# Patient Record
Sex: Female | Born: 1970 | Race: White | Hispanic: No | Marital: Married | Smoking: Former smoker
Health system: Southern US, Community
[De-identification: ages and names within clinical notes are randomized; demographics above are authoritative.]

## PROBLEM LIST (undated history)

## (undated) DIAGNOSIS — J189 Pneumonia, unspecified organism: Secondary | ICD-10-CM

## (undated) DIAGNOSIS — D649 Anemia, unspecified: Secondary | ICD-10-CM

## (undated) DIAGNOSIS — K224 Dyskinesia of esophagus: Secondary | ICD-10-CM

## (undated) DIAGNOSIS — M459 Ankylosing spondylitis of unspecified sites in spine: Secondary | ICD-10-CM

## (undated) DIAGNOSIS — N200 Calculus of kidney: Secondary | ICD-10-CM

## (undated) DIAGNOSIS — E785 Hyperlipidemia, unspecified: Secondary | ICD-10-CM

## (undated) DIAGNOSIS — R350 Frequency of micturition: Secondary | ICD-10-CM

## (undated) DIAGNOSIS — Z8719 Personal history of other diseases of the digestive system: Secondary | ICD-10-CM

## (undated) DIAGNOSIS — M5137 Other intervertebral disc degeneration, lumbosacral region: Secondary | ICD-10-CM

## (undated) DIAGNOSIS — R079 Chest pain, unspecified: Secondary | ICD-10-CM

## (undated) DIAGNOSIS — E7800 Pure hypercholesterolemia, unspecified: Secondary | ICD-10-CM

## (undated) DIAGNOSIS — M1990 Unspecified osteoarthritis, unspecified site: Secondary | ICD-10-CM

## (undated) DIAGNOSIS — G43909 Migraine, unspecified, not intractable, without status migrainosus: Secondary | ICD-10-CM

## (undated) DIAGNOSIS — H269 Unspecified cataract: Secondary | ICD-10-CM

## (undated) DIAGNOSIS — G5603 Carpal tunnel syndrome, bilateral upper limbs: Secondary | ICD-10-CM

## (undated) DIAGNOSIS — G540 Brachial plexus disorders: Secondary | ICD-10-CM

## (undated) DIAGNOSIS — Z87440 Personal history of urinary (tract) infections: Secondary | ICD-10-CM

## (undated) DIAGNOSIS — M51379 Other intervertebral disc degeneration, lumbosacral region without mention of lumbar back pain or lower extremity pain: Secondary | ICD-10-CM

## (undated) DIAGNOSIS — I10 Essential (primary) hypertension: Secondary | ICD-10-CM

## (undated) DIAGNOSIS — K219 Gastro-esophageal reflux disease without esophagitis: Secondary | ICD-10-CM

## (undated) DIAGNOSIS — I201 Angina pectoris with documented spasm: Secondary | ICD-10-CM

## (undated) DIAGNOSIS — Z973 Presence of spectacles and contact lenses: Secondary | ICD-10-CM

## (undated) DIAGNOSIS — I2510 Atherosclerotic heart disease of native coronary artery without angina pectoris: Secondary | ICD-10-CM

## (undated) DIAGNOSIS — F419 Anxiety disorder, unspecified: Secondary | ICD-10-CM

## (undated) DIAGNOSIS — Z87442 Personal history of urinary calculi: Secondary | ICD-10-CM

## (undated) DIAGNOSIS — N289 Disorder of kidney and ureter, unspecified: Secondary | ICD-10-CM

## (undated) DIAGNOSIS — Z87898 Personal history of other specified conditions: Secondary | ICD-10-CM

## (undated) HISTORY — DX: Atherosclerotic heart disease of native coronary artery without angina pectoris: I25.10

## (undated) HISTORY — PX: ANTERIOR CRUCIATE LIGAMENT (ACL) REVISION: SHX6707

## (undated) HISTORY — DX: Pure hypercholesterolemia, unspecified: E78.00

## (undated) HISTORY — DX: Essential (primary) hypertension: I10

## (undated) HISTORY — DX: Angina pectoris with documented spasm: I20.1

## (undated) HISTORY — PX: LUMBAR SPINE SURGERY: SHX701

## (undated) HISTORY — PX: ABDOMINOPLASTY: SUR9

## (undated) HISTORY — PX: KNEE ARTHROSCOPY W/ SYNOVECTOMY: SHX1887

## (undated) HISTORY — DX: Unspecified cataract: H26.9

## (undated) HISTORY — PX: POSTERIOR LUMBAR FUSION: SHX6036

## (undated) HISTORY — PX: HERNIA REPAIR: SHX51

## (undated) HISTORY — DX: Chest pain, unspecified: R07.9

## (undated) HISTORY — PX: KNEE SURGERY: SHX244

## (undated) HISTORY — PX: BACK SURGERY: SHX140

## (undated) HISTORY — DX: Anxiety disorder, unspecified: F41.9

---
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
## 2000-08-11 HISTORY — PX: KNEE ARTHROSCOPY W/ ACL RECONSTRUCTION: SHX1858

## 2002-08-11 HISTORY — PX: KNEE SURGERY: SHX244

## 2003-08-12 HISTORY — PX: OTHER SURGICAL HISTORY: SHX169

## 2004-01-09 ENCOUNTER — Ambulatory Visit (HOSPITAL_BASED_OUTPATIENT_CLINIC_OR_DEPARTMENT_OTHER): Admission: RE | Admit: 2004-01-09 | Discharge: 2004-01-09 | Payer: Self-pay | Admitting: Orthopedic Surgery

## 2007-08-12 HISTORY — PX: CHOLECYSTECTOMY OPEN: SUR202

## 2007-08-12 HISTORY — PX: HERNIA REPAIR: SHX51

## 2007-08-12 HISTORY — PX: CHOLECYSTECTOMY: SHX55

## 2010-08-11 HISTORY — PX: CYSTOSCOPY/RETROGRADE/URETEROSCOPY/STONE EXTRACTION WITH BASKET: SHX5317

## 2012-08-11 HISTORY — PX: OTHER SURGICAL HISTORY: SHX169

## 2014-08-11 DIAGNOSIS — Z8711 Personal history of peptic ulcer disease: Secondary | ICD-10-CM

## 2014-08-11 HISTORY — DX: Personal history of peptic ulcer disease: Z87.11

## 2015-08-12 HISTORY — PX: TOTAL ABDOMINAL HYSTERECTOMY: SHX209

## 2015-08-12 HISTORY — PX: KNEE ARTHROSCOPY W/ ACL RECONSTRUCTION: SHX1858

## 2016-02-26 HISTORY — PX: TOTAL LAPAROSCOPIC HYSTERECTOMY WITH BILATERAL SALPINGO OOPHORECTOMY: SHX6845

## 2016-06-01 ENCOUNTER — Emergency Department (HOSPITAL_COMMUNITY)
Admission: EM | Admit: 2016-06-01 | Discharge: 2016-06-01 | Disposition: A | Discharge status: Home / Self Care | Payer: Managed Care, Other (non HMO) | Attending: Emergency Medicine | Admitting: Emergency Medicine

## 2016-06-01 ENCOUNTER — Encounter: Payer: Self-pay | Admitting: Emergency Medicine

## 2016-06-01 ENCOUNTER — Emergency Department (HOSPITAL_COMMUNITY): Payer: Managed Care, Other (non HMO)

## 2016-06-01 DIAGNOSIS — J069 Acute upper respiratory infection, unspecified: Secondary | ICD-10-CM | POA: Diagnosis not present

## 2016-06-01 DIAGNOSIS — M5412 Radiculopathy, cervical region: Secondary | ICD-10-CM

## 2016-06-01 DIAGNOSIS — R509 Fever, unspecified: Secondary | ICD-10-CM | POA: Diagnosis present

## 2016-06-01 DIAGNOSIS — Z79899 Other long term (current) drug therapy: Secondary | ICD-10-CM | POA: Insufficient documentation

## 2016-06-01 MED ORDER — METAXALONE 400 MG PO TABS: 400.0000 mg | ORAL_TABLET | Freq: Three times a day (TID) | ORAL | 0 refills | Status: DC | PRN

## 2016-06-01 NOTE — ED Triage Notes (Signed)
Pt reports left shoulder pain that radiates down her left arm, fever, and wet non-productive cough. Pt states she was seen and d/c'd by [facility] w/ dx of a pinched nerve in her neck. Pt states pain meds have provided no relief and she was unable to secure an appt w/ her PCP for further evaluation.

## 2016-06-01 NOTE — ED Provider Notes (Signed)
[facility] DEPT Provider Note   CSN: [id number] Arrival date & time: 06/01/16  1346     History   Chief Complaint Chief Complaint  Patient presents with  . Arm Pain  . Fever  . Cough    HPI Tanice Petre is a 45 y.o. female.  HPI Patient presents with neck pain going down to her left arm. She's had it for the last 4 days. She was seen by [facility]. Reportedly had x-rays and was told to watch out for fevers. She was given oxycodone tablets and states she was told to take 20 mg if she needed it. Also given Valium. She told she was also given Narcan to take in case she stopped breathing. States she was told to return if she developed fevers. States she's now developed low-grade fevers of 99. States the pain started after coughing. States on Wednesday she had a cough. States she was seen by primary care doctor and given a shot of "kenalog" she states that it helped the cough did not change her otherwise. The next day she developed the neck pain. She states that her daughter is in nursing school and said that neck pain could be a heart attack so she needed to go to the ER. She states the pain goes down the arm. States she was told was likely a pinched nerve. States it feels like when you put your tongue on a battery. No weakness. States she initially had mildly productive cough and now is wet but states she's not been able to bring anything up with it. No dysuria. Some abdominal pain after recent hysterectomy.   No past medical history on file.  There are no active problems to display for this patient.   No past surgical history on file.  OB History    No data available       Home Medications    Prior to Admission medications   Medication Sig Start Date End Date Taking? Authorizing Provider  acetaminophen (TYLENOL) 500 MG tablet Take 500 mg by mouth every 6 (six) hours as needed for moderate pain.   Yes Historical Provider, MD  amLODipine-valsartan (EXFORGE) 5-320 MG  tablet Take 1 tablet by mouth every evening.   Yes Historical Provider, MD  estradiol (ESTRACE) 1 MG tablet Take 1 mg by mouth every evening.   Yes Historical Provider, MD  metoprolol tartrate (LOPRESSOR) 25 MG tablet Take 25 mg by mouth 2 (two) times daily.   Yes Historical Provider, MD  nitroGLYCERIN (NITROSTAT) 0.4 MG SL tablet Place 0.4 mg under the tongue every 5 (five) minutes as needed for chest pain.   Yes Historical Provider, MD  Oxycodone HCl 10 MG TABS Take 10-20 mg by mouth every 6 (six) hours as needed (pain).   Yes Historical Provider, MD  pantoprazole (PROTONIX) 40 MG tablet Take 40 mg by mouth 2 (two) times daily.   Yes Historical Provider, MD  PROAIR RESPICLICK [phone] Base) MCG/ACT AEPB Take 2 puffs by mouth every 6 (six) hours as needed (sob and wheezing).  05/29/16  Yes Historical Provider, MD  promethazine (PHENERGAN) 25 MG tablet Take 25 mg by mouth every 6 (six) hours as needed for nausea or vomiting.   Yes Historical Provider, MD  senna-docusate (SENOKOT-S) 8.6-50 MG tablet Take 1 tablet by mouth 2 (two) times daily.   Yes Historical Provider, MD  SUMAtriptan (IMITREX) 100 MG tablet Take 100 mg by mouth every 2 (two) hours as needed for migraine or headache.  05/26/16  Yes Historical Provider, MD  metaxalone (SKELAXIN) 400 MG tablet Take 1 tablet (400 mg total) by mouth 3 (three) times daily as needed for muscle spasms. 06/01/16   Davonna Belling, MD  NARCAN 4 MG/0.1ML LIQD Take 4 mg by mouth daily as needed (breathing).  05/30/16   Historical Provider, MD    Family History No family history on file.  Social History Social History  Substance Use Topics  . Smoking status: Not on file  . Smokeless tobacco: Not on file  . Alcohol use Not on file     Allergies   Nsaids and Sulfa antibiotics   Review of Systems Review of Systems  Constitutional: Negative for appetite change.  HENT: Negative for facial swelling.   Respiratory: Positive for cough. Negative for  chest tightness.   Cardiovascular: Negative for chest pain.  Gastrointestinal: Negative for abdominal pain.  Genitourinary: Negative for flank pain.  Musculoskeletal: Positive for neck pain.  Neurological: Negative for seizures and numbness.  Psychiatric/Behavioral: Negative for confusion.     Physical Exam Updated Vital Signs BP (!) 138/101 (BP Location: Left Arm)   Pulse 88   Temp 99.1 F (37.3 C) (Oral)   Resp 16   Ht 5' 3.75" (1.619 m)   Wt 155 lb 1.6 oz (70.4 kg)   SpO2 99%   BMI 26.83 kg/m   Physical Exam  Constitutional: She appears well-developed.  HENT:  Head: Atraumatic.  Eyes: EOM are normal.  Neck: Normal range of motion. Neck supple.  Mild tenderness over left paraspinal area. Good range of motion.  Cardiovascular: Normal rate.   Pulmonary/Chest:  Mildly harsh breath sounds with frequent cough with lung examination.  Abdominal: There is tenderness.  Mild lower abdominal tenderness without rebound or guarding.  Musculoskeletal: She exhibits no edema.  Painless range of motion left arm.  Neurological: She is alert.  Good grip strength in left hand. Radial median and ulnar distribution intact. Strong radial pulse. Some mild tenderness at right elbow ulnar nerve area. States it is tingling in the fifth finger. No rash.  Skin: Skin is warm.  Psychiatric: She has a normal mood and affect.     ED Treatments / Results  Labs (all labs ordered are listed, but only abnormal results are displayed) Labs Reviewed - No data to display  EKG  EKG Interpretation None       Radiology Dg Chest 2 View  Result Date: 06/01/2016 CLINICAL DATA:  Left shoulder pain, cough EXAM: CHEST  2 VIEW COMPARISON:  03/14/2014 FINDINGS: The heart size and mediastinal contours are within normal limits. Both lungs are clear. The visualized skeletal structures are unremarkable. IMPRESSION: No active cardiopulmonary disease. Electronically Signed   By: Lahoma Crocker M.D.   On: 06/01/2016  16:39    Procedures Procedures (including critical care time)  Medications Ordered in ED Medications - No data to display   Initial Impression / Assessment and Plan / ED Course  I have reviewed the triage vital signs and the nursing notes.  Pertinent labs & imaging results that were available during my care of the patient were reviewed by me and considered in my medical decision making (see chart for details).  Clinical Course  Patient presents with neck pain. Recently seen and diagnosed with cervical radiculopathy. She also felt a little bit of a temperature. I think this is likely from her URI for the fever. She is well-appearing. No meningeal signs. There is some mild reticular numbness down the ulnar aspect of the arm.  I think this is not some pathology such as epidural abscess. Will follow with her primary care doctor. Will also follow with neurosurgery as needed. Discharge home. X-ray negative for pneumonia. Discussed steroids with patient, however she has a history of ulcers. And also had a shot of steroids on Wednesday with the pain developing Thursday.  Final Clinical Impressions(s) / ED Diagnoses   Final diagnoses:  Upper respiratory tract infection, unspecified type  Cervical radiculopathy    New Prescriptions New Prescriptions   METAXALONE (SKELAXIN) 400 MG TABLET    Take 1 tablet (400 mg total) by mouth 3 (three) times daily as needed for muscle spasms.     Davonna Belling, MD 06/01/16 [phone]

## 2017-01-19 ENCOUNTER — Encounter: Payer: Self-pay | Admitting: Cardiovascular Disease

## 2017-01-25 NOTE — Progress Notes (Signed)
Cardiology Office Note   Date:  01/25/2017   ID:  Marveen Reeks, DOB [birth year], MRN [id number]  PCP:  No primary care provider on file.  Cardiologist:   Jenkins Rouge, MD   No chief complaint on file.     History of Present Illness: Anna Wilson is a 46 y.o. female who presents for consultation regarding SSCP Referred by Dr Leitha Schuller [facility] CRFs include HL, HTN. Family history of CAD 12/31/16 seen by primary with Left anterior and lateral chest pain. Pain radiates to left shoulder.  Pressure like and squeezing. Not related to exertion. Intermittent. Helped to PPI and SL nitrol Evaluated at [facility]  Negative echo , nuclear stress test CT ? Related to asthma placed on Qvar and Pro Air inhalers. Seen by GI in past ? Esophageal spasm Rx for peptic ulcer in 2016 Thinks this pain is different than 2016   Former smoker quit 23 years ago    Reviewed notes from [facility] R/O CT no PE myovue normal EF 69% Troponin negative Telemetry no arrhythmia no acute ECG changes. ? 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 Sometimes helped with nitro Pain is different from GI pain and GERD.   Past Medical History:  Diagnosis Date  . Anxiety   . CAD (coronary artery disease)   . Chest pain   . 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 High cholesterol   . Hypertension     Past Surgical History:  Procedure Laterality Date  . CHOLECYSTECTOMY  2009  . ENDOMETRIOSIS SURGERY  1992  . HERNIA REPAIR  2009   WITH TUMMY TUCK  . KNEE ALC Left 2005  . KNEE ARTHROSCOPY W/ ACL RECONSTRUCTION Left 2017   LEFT KNEE ACL TEAR  . KNEE SURGERY Left 2004   SCAR TISSUE REMOVAL  . STENT FOR KIDNEY STONES  2014  . TOTAL ABDOMINAL HYSTERECTOMY  2017     Current Outpatient Prescriptions  Medication Sig Dispense Refill  . acetaminophen (TYLENOL) 500 MG tablet Take 500 mg by mouth every 6 (six) hours as  needed for moderate pain.    Marland Kitchen amLODipine-valsartan (EXFORGE) 5-320 MG tablet Take 1 tablet by mouth every evening.    Marland Kitchen estradiol (ESTRACE) 1 MG tablet Take 1 mg by mouth every evening.    . metaxalone (SKELAXIN) 400 MG tablet Take 1 tablet (400 mg total) by mouth 3 (three) times daily as needed for muscle spasms. 10 tablet 0  . metoprolol tartrate (LOPRESSOR) 25 MG tablet Take 25 mg by mouth 2 (two) times daily.    Marland Kitchen NARCAN 4 MG/0.1ML LIQD Take 4 mg by mouth daily as needed (breathing).     . nitroGLYCERIN (NITROSTAT) 0.4 MG SL tablet Place 0.4 mg under the tongue every 5 (five) minutes as needed for chest pain.    . Oxycodone HCl 10 MG TABS Take 10-20 mg by mouth every 6 (six) hours as needed (pain).    . pantoprazole (PROTONIX) 40 MG tablet Take 40 mg by mouth 2 (two) times daily.    Marland Kitchen PROAIR RESPICLICK 185 (90 Base) MCG/ACT AEPB Take 2 puffs by mouth every 6 (six) hours as needed (sob and wheezing).     . promethazine (PHENERGAN) 25 MG tablet Take 25 mg by mouth every 6 (six) hours as needed for nausea or vomiting.    . senna-docusate (SENOKOT-S) 8.6-50 MG tablet Take 1 tablet  by mouth 2 (two) times daily.    . SUMAtriptan (IMITREX) 100 MG tablet Take 100 mg by mouth every 2 (two) hours as needed for migraine or headache.      No current facility-administered medications for this visit.     Allergies:   Nsaids and Sulfa antibiotics    Social History:  The patient  reports that she has quit smoking. She has never used smokeless tobacco. She reports that she drinks alcohol. She reports that she does not use drugs.   Family History:  The patient's family history includes Asthma in her daughter and son; Hypertension in her father and mother; Hypothyroidism in her brother and brother; Rheum arthritis in her daughter.    ROS:  Please see the history of present illness.   Otherwise, review of systems are positive for none.   All other systems are reviewed and negative.    PHYSICAL  EXAM: VS:  There were no vitals taken for this visit. , BMI There is no height or weight on file to calculate BMI. Affect appropriate Healthy:  appears stated age 24: normal Neck supple with no adenopathy JVP normal no bruits no thyromegaly Lungs clear with no wheezing and good diaphragmatic motion Heart:  S1/S2 no murmur, no rub, gallop or click PMI normal Abdomen: benighn, BS positve, no tenderness, no AAA no bruit.  No HSM or HJR Distal pulses intact with no bruits No edema Neuro non-focal Skin warm and dry No muscular weakness    EKG:   01/28/17  NSR normal ECG rate 62   Recent Labs: No results found for requested labs within last 8760 hours.    Lipid Panel No results found for: CHOL, TRIG, HDL, CHOLHDL, VLDL, LDLCALC, LDLDIRECT    Wt Readings from Last 3 Encounters:  06/01/16 70.4 kg (155 lb 1.6 oz)      Other studies Reviewed: Additional studies/ records that were reviewed today include: Notes [facility] admission 5/12//-5/14 Myovue CTA ECG labs and notes from [facility]   ASSESSMENT AND PLAN:  1.  Chest Pain:  Extensive w/u [facility] last month with r/o and normal myovue no ischemia EF 69% Ongoing symptoms and concern for disease some help with nitro Don't think invasive evaluation warranted Feel best test for f/u is anatomical and cardiac CTA Discussed with patient and willing To proceed. Has had both CT and contrast before with no issues and Cr is normal   2. GI:  History of peptic ulcer GERD on sucralafate and PPI 3. HTN: Well controlled.  Continue current medications and low sodium Dash type diet.   4. Cholesterol diet Rx if calcium score high or CAD found may need to change goal   Current medicines are reviewed at length with the patient today.  The patient does not have concerns regarding medicines.  The following changes have been made:  no change  Labs/ tests ordered today include: BME Cardiac CTA  No orders of the defined types were placed in this  encounter.    Disposition:   FU with me PRN if CT negative      Signed, Jenkins Rouge, MD  01/25/2017 3:38 PM    [facility] [address], [place], [place]  [address] Phone: [phone]; Fax: [phone]

## 2017-01-28 ENCOUNTER — Encounter: Payer: Self-pay | Admitting: Cardiovascular Disease

## 2017-01-28 ENCOUNTER — Encounter (INDEPENDENT_AMBULATORY_CARE_PROVIDER_SITE_OTHER): Payer: Self-pay

## 2017-01-28 ENCOUNTER — Ambulatory Visit (INDEPENDENT_AMBULATORY_CARE_PROVIDER_SITE_OTHER): Payer: PRIVATE HEALTH INSURANCE | Admitting: Cardiovascular Disease

## 2017-01-28 VITALS — BP 110/84 | HR 62 | Ht 63.5 in | Wt 144.2 lb

## 2017-01-28 DIAGNOSIS — R079 Chest pain, unspecified: Secondary | ICD-10-CM | POA: Diagnosis not present

## 2017-01-28 DIAGNOSIS — I10 Essential (primary) hypertension: Secondary | ICD-10-CM | POA: Diagnosis not present

## 2017-01-28 NOTE — Patient Instructions (Addendum)
Medication Instructions:  Your physician recommends that you continue on your current medications as directed. Please refer to the Current Medication list given to you today.  Labwork: Your physician recommends that you return for lab work in: 3 weeks before test. BMET  Testing/Procedures: Your physician has requested that you have cardiac CT on 02/19/17 per Dr. Johnsie Cancel. Cardiac computed tomography (CT) is a painless test that uses an x-ray machine to take clear, detailed pictures of your heart. For further information please visit [email or web address]. Please follow instruction sheet as given.  Follow-Up: Your physician wants you to follow-up as needed with Dr. Johnsie Cancel.   If you need a refill on your cardiac medications before your next appointment, please call your pharmacy.

## 2017-03-13 ENCOUNTER — Telehealth: Payer: Self-pay

## 2017-03-13 DIAGNOSIS — Z01812 Encounter for preprocedural laboratory examination: Secondary | ICD-10-CM

## 2017-03-13 DIAGNOSIS — R079 Chest pain, unspecified: Secondary | ICD-10-CM

## 2017-03-13 NOTE — Telephone Encounter (Signed)
-----   Message from Josue Hector, MD sent at 03/13/2017  7:49 AM EDT ----- She has already had normal myovue with recurrent chest pain If insurance will not cover CT only option is cath. If patient wants to proceed can schedule next week  ----- Message ----- From: Michaelyn Barter, RN Sent: 03/11/2017   4:19 PM To: Josue Hector, MD  Insurance denied approval for CT. Is there another test you would like to order?  Pam

## 2017-03-13 NOTE — Telephone Encounter (Signed)
Called patient about Dr. Kyla Balzarine recommendations. Patient agreed to have heart cath on 03/17/17 with Dr. Angelena Form. Went over instructions for procedure. Patient will come in on Monday for lab work. Patient verbalized understanding and will pick up copy of instructions on Monday at check in desk.

## 2017-03-16 ENCOUNTER — Telehealth: Payer: Self-pay

## 2017-03-16 ENCOUNTER — Other Ambulatory Visit: Payer: Managed Care, Other (non HMO) | Admitting: *Deleted

## 2017-03-16 DIAGNOSIS — R079 Chest pain, unspecified: Secondary | ICD-10-CM

## 2017-03-16 DIAGNOSIS — Z01812 Encounter for preprocedural laboratory examination: Secondary | ICD-10-CM

## 2017-03-16 LAB — BASIC METABOLIC PANEL
BUN / CREAT RATIO: 13 (ref 9–23)
BUN: 11 mg/dL (ref 6–24)
CHLORIDE: 100 mmol/L (ref 96–106)
CO2: 25 mmol/L (ref 20–29)
Calcium: 9.6 mg/dL (ref 8.7–10.2)
Creatinine, Ser: 0.85 mg/dL (ref 0.57–1.00)
GFR calc non Af Amer: 84 mL/min/{1.73_m2} (ref >59)
GFR, EST AFRICAN AMERICAN: 96 mL/min/{1.73_m2} (ref >59)
GLUCOSE: 79 mg/dL (ref 65–99)
POTASSIUM: 4.3 mmol/L (ref 3.5–5.2)
Sodium: 138 mmol/L (ref 134–144)

## 2017-03-16 LAB — CBC WITH DIFFERENTIAL/PLATELET
BASOS ABS: 0 10*3/uL (ref 0.0–0.2)
Basos: 0 %
EOS (ABSOLUTE): 0.1 10*3/uL (ref 0.0–0.4)
Eos: 2 %
HEMOGLOBIN: 13.2 g/dL (ref 11.1–15.9)
Hematocrit: 40.6 % (ref 34.0–46.6)
Immature Grans (Abs): 0 10*3/uL (ref 0.0–0.1)
Immature Granulocytes: 0 %
LYMPHS ABS: 1.8 10*3/uL (ref 0.7–3.1)
Lymphs: 32 %
MCH: 28.4 pg (ref 26.6–33.0)
MCHC: 32.5 g/dL (ref 31.5–35.7)
MCV: 88 fL (ref 79–97)
MONOS ABS: 0.3 10*3/uL (ref 0.1–0.9)
Monocytes: 6 %
NEUTROS ABS: 3.3 10*3/uL (ref 1.4–7.0)
Neutrophils: 60 %
Platelets: 307 10*3/uL (ref 150–379)
RBC: 4.64 x10E6/uL (ref 3.77–5.28)
RDW: 13.3 % (ref 12.3–15.4)
WBC: 5.5 10*3/uL (ref 3.4–10.8)

## 2017-03-16 LAB — PROTIME-INR
INR: 1 (ref 0.8–1.2)
Prothrombin Time: 10.2 s (ref 9.1–12.0)

## 2017-03-16 NOTE — Telephone Encounter (Signed)
Patient contacted pre-catheterization at [facility] scheduled for:  03/17/2017 @ 1030 Verified arrival time and place:  NT @ 0800 Confirmed AM meds to be taken pre-cath with sip of water: Notified Pt to take ASA prior to arrival-Pt states she does not take NSAIDS d/t gastric ulcers. Notified Pt that this nurse believes that an ASA must be taken prior to procedure-offered to make sure.  Pt states she will go get a bottle.  Notified Pt that it would just be a one time dose prior to procedure.  Pt indicates understanding.   Confirmed patient has responsible person to drive home post procedure and observe patient for 24 hours:  yes Addl concerns:  Pt asked if cath would be through wrist or groin.  Notified Pt that usually caths are through wrist-especially with her age.

## 2017-03-17 ENCOUNTER — Encounter (HOSPITAL_COMMUNITY)
Admission: RE | Disposition: A | Discharge status: Home / Self Care | Payer: Self-pay | Source: Ambulatory Visit | Attending: Cardiovascular Disease

## 2017-03-17 ENCOUNTER — Encounter (HOSPITAL_COMMUNITY): Payer: Self-pay | Admitting: Cardiovascular Disease

## 2017-03-17 ENCOUNTER — Ambulatory Visit (HOSPITAL_COMMUNITY)
Admission: RE | Admit: 2017-03-17 | Discharge: 2017-03-17 | Disposition: A | Discharge status: Home / Self Care | Payer: PRIVATE HEALTH INSURANCE | Source: Ambulatory Visit | Attending: Cardiovascular Disease | Admitting: Cardiovascular Disease

## 2017-03-17 DIAGNOSIS — I10 Essential (primary) hypertension: Secondary | ICD-10-CM | POA: Insufficient documentation

## 2017-03-17 DIAGNOSIS — Z87891 Personal history of nicotine dependence: Secondary | ICD-10-CM | POA: Diagnosis not present

## 2017-03-17 DIAGNOSIS — R072 Precordial pain: Secondary | ICD-10-CM | POA: Diagnosis not present

## 2017-03-17 DIAGNOSIS — F419 Anxiety disorder, unspecified: Secondary | ICD-10-CM | POA: Insufficient documentation

## 2017-03-17 DIAGNOSIS — Z8249 Family history of ischemic heart disease and other diseases of the circulatory system: Secondary | ICD-10-CM | POA: Insufficient documentation

## 2017-03-17 DIAGNOSIS — E7800 Pure hypercholesterolemia, unspecified: Secondary | ICD-10-CM | POA: Diagnosis not present

## 2017-03-17 HISTORY — PX: LEFT HEART CATH AND CORONARY ANGIOGRAPHY: CATH118249

## 2017-03-17 SURGERY — LEFT HEART CATH AND CORONARY ANGIOGRAPHY
Anesthesia: LOCAL

## 2017-03-17 MED ORDER — SODIUM CHLORIDE 0.9% FLUSH: 3.0000 mL | INTRAVENOUS | Status: DC | PRN

## 2017-03-17 MED ORDER — FENTANYL CITRATE (PF) 100 MCG/2ML IJ SOLN
INTRAMUSCULAR | Status: DC | PRN
  Administered 2017-03-17: 50 ug via INTRAVENOUS

## 2017-03-17 MED ORDER — SODIUM CHLORIDE 0.9% FLUSH: 3.0000 mL | Freq: Two times a day (BID) | INTRAVENOUS | Status: DC

## 2017-03-17 MED ORDER — HEPARIN (PORCINE) IN NACL 2-0.9 UNIT/ML-% IJ SOLN
INTRAMUSCULAR | Status: AC
  Filled 2017-03-17: qty 1000

## 2017-03-17 MED ORDER — LIDOCAINE HCL (PF) 1 % IJ SOLN
INTRAMUSCULAR | Status: AC
  Filled 2017-03-17: qty 30

## 2017-03-17 MED ORDER — LIDOCAINE HCL (PF) 1 % IJ SOLN
INTRAMUSCULAR | Status: DC | PRN
  Administered 2017-03-17: 2 mL via INTRADERMAL

## 2017-03-17 MED ORDER — MIDAZOLAM HCL 2 MG/2ML IJ SOLN
INTRAMUSCULAR | Status: DC | PRN
  Administered 2017-03-17: 2 mg via INTRAVENOUS

## 2017-03-17 MED ORDER — MIDAZOLAM HCL 2 MG/2ML IJ SOLN
INTRAMUSCULAR | Status: AC
  Filled 2017-03-17: qty 2

## 2017-03-17 MED ORDER — SODIUM CHLORIDE 0.9 % IV SOLN: 250.0000 mL | INTRAVENOUS | Status: DC | PRN

## 2017-03-17 MED ORDER — HEPARIN (PORCINE) IN NACL 2-0.9 UNIT/ML-% IJ SOLN
INTRAMUSCULAR | Status: AC | PRN
  Administered 2017-03-17: 1000 mL

## 2017-03-17 MED ORDER — HEPARIN SODIUM (PORCINE) 1000 UNIT/ML IJ SOLN
INTRAMUSCULAR | Status: AC
  Filled 2017-03-17: qty 1

## 2017-03-17 MED ORDER — IOPAMIDOL (ISOVUE-370) INJECTION 76%
INTRAVENOUS | Status: DC | PRN
  Administered 2017-03-17: 50 mL via INTRA_ARTERIAL

## 2017-03-17 MED ORDER — VERAPAMIL HCL 2.5 MG/ML IV SOLN
INTRAVENOUS | Status: DC | PRN
  Administered 2017-03-17: 10 mL via INTRA_ARTERIAL

## 2017-03-17 MED ORDER — IOPAMIDOL (ISOVUE-370) INJECTION 76%
INTRAVENOUS | Status: AC
  Filled 2017-03-17: qty 100

## 2017-03-17 MED ORDER — ASPIRIN 81 MG PO CHEW: 81.0000 mg | CHEWABLE_TABLET | ORAL | Status: DC

## 2017-03-17 MED ORDER — FENTANYL CITRATE (PF) 100 MCG/2ML IJ SOLN
INTRAMUSCULAR | Status: AC
  Filled 2017-03-17: qty 2

## 2017-03-17 MED ORDER — SODIUM CHLORIDE 0.9 % IV SOLN: INTRAVENOUS | Status: AC

## 2017-03-17 MED ORDER — SODIUM CHLORIDE 0.9 % WEIGHT BASED INFUSION
3.0000 mL/kg/h | INTRAVENOUS | Status: AC
  Administered 2017-03-17: 3 mL/kg/h via INTRAVENOUS

## 2017-03-17 MED ORDER — VERAPAMIL HCL 2.5 MG/ML IV SOLN
INTRAVENOUS | Status: AC
  Filled 2017-03-17: qty 2

## 2017-03-17 MED ORDER — HEPARIN SODIUM (PORCINE) 1000 UNIT/ML IJ SOLN
INTRAMUSCULAR | Status: DC | PRN
  Administered 2017-03-17: 3500 [IU] via INTRAVENOUS

## 2017-03-17 MED ORDER — SODIUM CHLORIDE 0.9 % WEIGHT BASED INFUSION: 1.0000 mL/kg/h | INTRAVENOUS | Status: DC

## 2017-03-17 SURGICAL SUPPLY — 10 items

## 2017-03-17 NOTE — Research (Addendum)
OPTIMIZE Informed Consent   Subject Name: Anna Wilson  Subject met inclusion and exclusion criteria.  The informed consent form, study requirements and expectations were reviewed with the subject and questions and concerns were addressed prior to the signing of the consent form.  The subject verbalized understanding of the trail requirements.  The subject agreed to participate in the OPTIMIZE trial and signed the informed consent.  The informed consent was obtained prior to performance of any protocol-specific procedures for the subject.  A copy of the signed informed consent was given to the subject and a copy was placed in the subject's medical record. Only applicable if randomized.   Philemon Kingdom D 03/17/2017, 0945 AM

## 2017-03-17 NOTE — Progress Notes (Signed)
Dr Angelena Form notified of client c/o 2/10 chest pain and no new orders noted

## 2017-03-17 NOTE — H&P (Signed)
Patient ID: Anna Wilson MRN: [id number] DOB/AGE: January 20, 1971 46 y.o. Admit date: 03/17/2017  Primary Care Physician: System, Pcp Not In Primary Cardiologist: Johnsie Cancel  HPI: 47 yo female with history of chest pain, anxiety, HLD, HTN, suspected coronary artery vasospasm here today for cardiac cath. She has had issues with chest pain at rest and with exertion with several admissions at [facility]. Negative stress test. Insurance would not approve a coronary CTA so she is here today for cardiac cath to exclude obstructive CAD. No chest pain at rest today. No dyspnea, palpitations, LE edema.   Review of systems complete and found to be negative unless listed above   Past Medical History:  Diagnosis Date  . Anxiety   . CAD (coronary artery disease)   . Chest pain   . 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 High cholesterol   . Hypertension     Family History  Problem Relation Age of Onset  . Hypertension Mother   . Hypertension Father   . Hypothyroidism Brother   . Rheum arthritis Daughter   . Asthma Daughter   . Hypothyroidism Brother   . Asthma Son     Social History   Social History  . Marital status: Single    Spouse name: N/A  . Number of children: N/A  . Years of education: N/A   Occupational History  . Not on file.   Social History Main Topics  . Smoking status: Former Research scientist (life sciences)  . Smokeless tobacco: Never Used  . Alcohol use Yes  . Drug use: No  . Sexual activity: Not on file   Other Topics Concern  . Not on file   Social History Narrative  . No narrative on file    Past Surgical History:  Procedure Laterality Date  . CHOLECYSTECTOMY  2009  . ENDOMETRIOSIS SURGERY  1992  . HERNIA REPAIR  2009   WITH TUMMY TUCK  . KNEE ALC Left 2005  . KNEE ARTHROSCOPY W/ ACL RECONSTRUCTION Left 2017   LEFT KNEE ACL TEAR  . KNEE SURGERY Left 2004   SCAR TISSUE REMOVAL  . STENT FOR KIDNEY STONES  2014  . TOTAL ABDOMINAL HYSTERECTOMY  2017    Allergies  Allergen Reactions   . Nsaids Other (See Comments)    Ulcers  . Sulfa Antibiotics Other (See Comments)    Pt states "I bleed from my body orficese when I take sulfa drugs"    Prior to Admission Meds:  Prior to Admission medications   Medication Sig Start Date End Date Taking? Authorizing Provider  acetaminophen (TYLENOL) 500 MG tablet Take 500-1,000 mg by mouth every 6 (six) hours as needed (for pain/headache.).    Yes [provider]  amLODipine-valsartan (EXFORGE) 5-320 MG tablet Take 1 tablet by mouth daily.    Yes [provider]  atorvastatin (LIPITOR) 10 MG tablet Take 10 mg by mouth daily.   Yes [provider]  Coenzyme Q10 (COQ10) 100 MG CAPS Take 100 mg by mouth daily.   Yes [provider]  conjugated estrogens (PREMARIN) vaginal cream Place 1 g vaginally every 3 (three) days. 1 oz of cream vaginally every 3 days.   Yes [provider]  estradiol (ESTRACE) 1 MG tablet Take 1 mg by mouth at bedtime.    Yes [provider]  gabapentin (NEURONTIN) 100 MG capsule Take 200 mg by mouth 3 (three) times daily. 10/13/16 10/13/17 Yes [provider]  nitroGLYCERIN (NITROSTAT) 0.4 MG SL tablet Place  0.4 mg under the tongue every 5 (five) minutes as needed for chest pain.   Yes [provider]  pantoprazole (PROTONIX) 40 MG tablet Take 40 mg by mouth 2 (two) times daily.   Yes [provider]  pyridOXINE (VITAMIN B-6) 100 MG tablet Take 100 mg by mouth daily.   Yes [provider]  SUMAtriptan (IMITREX) 100 MG tablet Take 100 mg by mouth every 2 (two) hours as needed for migraine or headache.  05/26/16  Yes [provider]    Physical Exam: Blood pressure (!) 137/93, pulse 75, temperature 97.7 F (36.5 C), temperature source Oral, height 5' 7"  (1.702 m), weight 145 lb (65.8 kg), SpO2 100 %.    General: Well developed, well nourished, NAD  HEENT: OP clear, mucus membranes moist  SKIN: warm, dry. No rashes.  Neuro:  No focal deficits  Musculoskeletal: Muscle strength 5/5 all ext  Psychiatric: Mood and affect normal  Neck: No JVD, no carotid bruits, no thyromegaly, no lymphadenopathy.  CV:RRR Abdomen:Soft. Bowel sounds present. Non-tender.  Extremities: No lower extremity edema. Pulses are 2 + in the bilateral DP/PT.   Labs:   Lab Results  Component Value Date   WBC 5.5 03/16/2017   HGB 13.2 03/16/2017   HCT 40.6 03/16/2017   MCV 88 03/16/2017   PLT 307 03/16/2017    Recent Labs Lab 03/16/17 1109  NA 138  K 4.3  CL 100  CO2 25  BUN 11  CREATININE 0.85  CALCIUM 9.6  GLUCOSE 79      ASSESSMENT AND PLAN:   1. Chest pain: Cardiac cath to exclude obstructive CAD. Possible PCI.   Darlina Guys, MD 03/17/2017, 9:14 AM

## 2017-03-17 NOTE — Progress Notes (Signed)
Pt alerted [place] via call light stating her wrist hurt. Pressure was held proximal to the tr band for 15 minutes, small hematoma was resolved. Level 1 right wrist,vss, denies pain, will continue to monitor

## 2017-03-17 NOTE — Discharge Instructions (Signed)

## 2017-03-17 NOTE — Interval H&P Note (Signed)
History and Physical Interval Note:  03/17/2017 10:30 AM  Aldona Bar Henshaw  has presented today for cardiac cath with the diagnosis of chest pain. The various methods of treatment have been discussed with the patient and family. After consideration of risks, benefits and other options for treatment, the patient has consented to  Procedure(s): LEFT HEART CATH AND CORONARY ANGIOGRAPHY (N/A) as a surgical intervention .  The patient's history has been reviewed, patient examined, no change in status, stable for surgery.  I have reviewed the patient's chart and labs.  Questions were answered to the patient's satisfaction.    Cath Lab Visit (complete for each Cath Lab visit)  Clinical Evaluation Leading to the Procedure:   ACS: No.  Non-ACS:    Anginal Classification: CCS II  Anti-ischemic medical therapy: Minimal Therapy (1 class of medications)  Non-Invasive Test Results: Low-risk stress test findings: cardiac mortality <1%/year  Prior CABG: No previous CABG         Lauree Chandler

## 2020-02-24 ENCOUNTER — Other Ambulatory Visit: Payer: Self-pay

## 2020-02-24 ENCOUNTER — Emergency Department (HOSPITAL_COMMUNITY): Payer: No Typology Code available for payment source

## 2020-02-24 ENCOUNTER — Encounter (HOSPITAL_COMMUNITY): Payer: Self-pay

## 2020-02-24 ENCOUNTER — Inpatient Hospital Stay (HOSPITAL_COMMUNITY)
Admission: EM | Admit: 2020-02-24 | Discharge: 2020-02-26 | DRG: 661 | Disposition: A | Discharge status: Home / Self Care | Payer: No Typology Code available for payment source | Attending: Family Medicine | Admitting: Family Medicine

## 2020-02-24 DIAGNOSIS — I10 Essential (primary) hypertension: Secondary | ICD-10-CM | POA: Diagnosis present

## 2020-02-24 DIAGNOSIS — E876 Hypokalemia: Secondary | ICD-10-CM | POA: Diagnosis not present

## 2020-02-24 DIAGNOSIS — Z825 Family history of asthma and other chronic lower respiratory diseases: Secondary | ICD-10-CM

## 2020-02-24 DIAGNOSIS — Z20822 Contact with and (suspected) exposure to covid-19: Secondary | ICD-10-CM | POA: Diagnosis present

## 2020-02-24 DIAGNOSIS — M479 Spondylosis, unspecified: Secondary | ICD-10-CM | POA: Diagnosis present

## 2020-02-24 DIAGNOSIS — F909 Attention-deficit hyperactivity disorder, unspecified type: Secondary | ICD-10-CM | POA: Diagnosis present

## 2020-02-24 DIAGNOSIS — Z79899 Other long term (current) drug therapy: Secondary | ICD-10-CM | POA: Diagnosis not present

## 2020-02-24 DIAGNOSIS — F329 Major depressive disorder, single episode, unspecified: Secondary | ICD-10-CM | POA: Diagnosis present

## 2020-02-24 DIAGNOSIS — Z79890 Hormone replacement therapy (postmenopausal): Secondary | ICD-10-CM

## 2020-02-24 DIAGNOSIS — N12 Tubulo-interstitial nephritis, not specified as acute or chronic: Secondary | ICD-10-CM | POA: Diagnosis present

## 2020-02-24 DIAGNOSIS — F419 Anxiety disorder, unspecified: Secondary | ICD-10-CM | POA: Diagnosis present

## 2020-02-24 DIAGNOSIS — K219 Gastro-esophageal reflux disease without esophagitis: Secondary | ICD-10-CM | POA: Diagnosis present

## 2020-02-24 DIAGNOSIS — N200 Calculus of kidney: Secondary | ICD-10-CM

## 2020-02-24 DIAGNOSIS — Z87891 Personal history of nicotine dependence: Secondary | ICD-10-CM

## 2020-02-24 DIAGNOSIS — Z8249 Family history of ischemic heart disease and other diseases of the circulatory system: Secondary | ICD-10-CM | POA: Diagnosis not present

## 2020-02-24 DIAGNOSIS — N3001 Acute cystitis with hematuria: Secondary | ICD-10-CM

## 2020-02-24 DIAGNOSIS — E785 Hyperlipidemia, unspecified: Secondary | ICD-10-CM | POA: Diagnosis present

## 2020-02-24 DIAGNOSIS — N136 Pyonephrosis: Principal | ICD-10-CM | POA: Diagnosis present

## 2020-02-24 LAB — URINALYSIS, ROUTINE W REFLEX MICROSCOPIC
Bilirubin Urine: NEGATIVE
Glucose, UA: NEGATIVE mg/dL
Ketones, ur: NEGATIVE mg/dL
Leukocytes,Ua: NEGATIVE
Nitrite: POSITIVE — AB
Protein, ur: 30 mg/dL — AB
RBC / HPF: >50 RBC/hpf — ABNORMAL HIGH (ref 0–5)
Specific Gravity, Urine: 1.02 (ref 1.005–1.030)
pH: 5 (ref 5.0–8.0)

## 2020-02-24 LAB — COMPREHENSIVE METABOLIC PANEL
ALT: 12 U/L (ref 0–44)
AST: 23 U/L (ref 15–41)
Albumin: 4.8 g/dL (ref 3.5–5.0)
Alkaline Phosphatase: 51 U/L (ref 38–126)
Anion gap: 9 (ref 5–15)
BUN: 21 mg/dL — ABNORMAL HIGH (ref 6–20)
CO2: 25 mmol/L (ref 22–32)
Calcium: 9.9 mg/dL (ref 8.9–10.3)
Chloride: 108 mmol/L (ref 98–111)
Creatinine, Ser: 1.06 mg/dL — ABNORMAL HIGH (ref 0.44–1.00)
GFR calc Af Amer: >60 mL/min (ref >60)
GFR calc non Af Amer: >60 mL/min (ref >60)
Glucose, Bld: 102 mg/dL — ABNORMAL HIGH (ref 70–99)
Potassium: 3.6 mmol/L (ref 3.5–5.1)
Sodium: 142 mmol/L (ref 135–145)
Total Bilirubin: 0.6 mg/dL (ref 0.3–1.2)
Total Protein: 8.2 g/dL — ABNORMAL HIGH (ref 6.5–8.1)

## 2020-02-24 LAB — CBC WITH DIFFERENTIAL/PLATELET
Abs Immature Granulocytes: 0.01 10*3/uL (ref 0.00–0.07)
Basophils Absolute: 0.1 10*3/uL (ref 0.0–0.1)
Basophils Relative: 1 %
Eosinophils Absolute: 0 10*3/uL (ref 0.0–0.5)
Eosinophils Relative: 0 %
HCT: 38.7 % (ref 36.0–46.0)
Hemoglobin: 12.5 g/dL (ref 12.0–15.0)
Immature Granulocytes: 0 %
Lymphocytes Relative: 22 %
Lymphs Abs: 2.1 10*3/uL (ref 0.7–4.0)
MCH: 28.3 pg (ref 26.0–34.0)
MCHC: 32.3 g/dL (ref 30.0–36.0)
MCV: 87.8 fL (ref 80.0–100.0)
Monocytes Absolute: 0.5 10*3/uL (ref 0.1–1.0)
Monocytes Relative: 5 %
Neutro Abs: 6.7 10*3/uL (ref 1.7–7.7)
Neutrophils Relative %: 72 %
Platelets: 332 10*3/uL (ref 150–400)
RBC: 4.41 MIL/uL (ref 3.87–5.11)
RDW: 12 % (ref 11.5–15.5)
WBC: 9.3 10*3/uL (ref 4.0–10.5)
nRBC: 0 % (ref 0.0–0.2)

## 2020-02-24 LAB — I-STAT BETA HCG BLOOD, ED (MC, WL, AP ONLY): I-stat hCG, quantitative: <5 m[IU]/mL (ref <5)

## 2020-02-24 LAB — LACTIC ACID, PLASMA: Lactic Acid, Venous: 1.1 mmol/L (ref 0.5–1.9)

## 2020-02-24 MED ORDER — HYDROMORPHONE HCL 1 MG/ML IJ SOLN
1.0000 mg | Freq: Once | INTRAMUSCULAR | Status: AC
  Administered 2020-02-24: 1 mg via INTRAVENOUS
  Filled 2020-02-24: qty 1

## 2020-02-24 MED ORDER — CEFTRIAXONE SODIUM 1 G IJ SOLR
1.0000 g | Freq: Once | INTRAMUSCULAR | Status: AC
  Administered 2020-02-24: 1 g via INTRAVENOUS
  Filled 2020-02-24: qty 10

## 2020-02-24 MED ORDER — ONDANSETRON HCL 4 MG/2ML IJ SOLN
4.0000 mg | Freq: Once | INTRAMUSCULAR | Status: AC
  Administered 2020-02-24: 4 mg via INTRAVENOUS
  Filled 2020-02-24: qty 2

## 2020-02-24 MED ORDER — SODIUM CHLORIDE 0.9 % IV BOLUS
1000.0000 mL | Freq: Once | INTRAVENOUS | Status: AC
  Administered 2020-02-24: 1000 mL via INTRAVENOUS

## 2020-02-24 NOTE — ED Notes (Signed)
Pt ambulatory from triage to acute room w/o assistance and with steady gait

## 2020-02-24 NOTE — ED Provider Notes (Signed)
[facility] DEPT Provider Note   CSN: [id number] Arrival date & time: 02/24/20  1726     History Chief Complaint  Patient presents with   Flank Pain   Emesis   Dysuria    Anna Wilson is a 49 y.o. female.  The history is provided by the patient and medical records. No language interpreter was used.  Flank Pain This is a recurrent problem. The current episode started 12 to 24 hours ago. The problem occurs constantly. The problem has not changed since onset.Associated symptoms include abdominal pain. Pertinent negatives include no chest pain, no headaches and no shortness of breath. Nothing aggravates the symptoms. Nothing relieves the symptoms. She has tried nothing for the symptoms. The treatment provided no relief.       Past Medical History:  Diagnosis Date   Anxiety    Chest pain    Esophageal spasm    High cholesterol    Hypertension    Renal disorder     Patient Active Problem List   Diagnosis Date Noted   Precordial pain     Past Surgical History:  Procedure Laterality Date   BACK SURGERY     CHOLECYSTECTOMY  2009   ENDOMETRIOSIS SURGERY  1992   HERNIA REPAIR  2009   WITH TUMMY TUCK   KNEE ALC Left 2005   KNEE ARTHROSCOPY W/ ACL RECONSTRUCTION Left 2017   LEFT KNEE ACL TEAR   KNEE SURGERY Left 2004   SCAR TISSUE REMOVAL   LEFT HEART CATH AND CORONARY ANGIOGRAPHY N/A 03/17/2017   Procedure: LEFT HEART CATH AND CORONARY ANGIOGRAPHY;  Surgeon: Burnell Blanks, MD;  Location: [facility];  Service: Cardiovascular;  Laterality: N/A;   STENT FOR KIDNEY STONES  2014   TOTAL ABDOMINAL HYSTERECTOMY  2017     OB History   No obstetric history on file.     Family History  Problem Relation Age of Onset   Hypertension Mother    Hypertension Father    Hypothyroidism Brother    Rheum arthritis Daughter    Asthma Daughter    Hypothyroidism Brother    Asthma Son     Social History    Tobacco Use   Smoking status: Former Smoker   Smokeless tobacco: Never Used  Scientific laboratory technician Use: Never used  Substance Use Topics   Alcohol use: Yes   Drug use: No    Home Medications Prior to Admission medications   Medication Sig Start Date End Date Taking? Authorizing Provider  acetaminophen (TYLENOL) 500 MG tablet Take 500-1,000 mg by mouth every 6 (six) hours as needed (for pain/headache.).     [provider]  amLODipine-valsartan (EXFORGE) 5-320 MG tablet Take 1 tablet by mouth daily.     [provider]  atorvastatin (LIPITOR) 10 MG tablet Take 10 mg by mouth daily.    [provider]  Coenzyme Q10 (COQ10) 100 MG CAPS Take 100 mg by mouth daily.    [provider]  conjugated estrogens (PREMARIN) vaginal cream Place 1 g vaginally every 3 (three) days. 1 oz of cream vaginally every 3 days.    [provider]  estradiol (ESTRACE) 1 MG tablet Take 1 mg by mouth at bedtime.     [provider]  gabapentin (NEURONTIN) 100 MG capsule Take 200 mg by mouth 3 (three) times daily. 10/13/16 10/13/17  [provider]  nitroGLYCERIN (NITROSTAT) 0.4 MG SL tablet Place 0.4 mg under the tongue every  5 (five) minutes as needed for chest pain.    [provider]  pantoprazole (PROTONIX) 40 MG tablet Take 40 mg by mouth 2 (two) times daily.    [provider]  pyridOXINE (VITAMIN B-6) 100 MG tablet Take 100 mg by mouth daily.    [provider]  SUMAtriptan (IMITREX) 100 MG tablet Take 100 mg by mouth every 2 (two) hours as needed for migraine or headache.  05/26/16   [provider]    Allergies    Nsaids and Sulfa antibiotics  Review of Systems   Review of Systems  Constitutional: Negative for chills, diaphoresis, fatigue and fever.  HENT: Negative for congestion.   Eyes: Negative for visual disturbance.  Respiratory: Negative for cough, chest tightness, shortness of breath and  wheezing.   Cardiovascular: Negative for chest pain, palpitations and leg swelling.  Gastrointestinal: Positive for abdominal pain, nausea and vomiting. Negative for constipation and diarrhea.  Genitourinary: Positive for flank pain, frequency and urgency. Negative for difficulty urinating, dysuria, pelvic pain, vaginal bleeding, vaginal discharge and vaginal pain.  Musculoskeletal: Positive for back pain. Negative for neck pain and neck stiffness.  Skin: Positive for wound (surgical wound on lumbar spine well appearing). Negative for rash.  Neurological: Negative for light-headedness and headaches.  Psychiatric/Behavioral: Negative for agitation and confusion.  All other systems reviewed and are negative.   Physical Exam Updated Vital Signs BP (!) 133/94 (BP Location: Right Arm)    Pulse 96    Temp 98.9 F (37.2 C) (Oral)    Resp 16    Ht 5' 3.75" (1.619 m)    Wt 56.7 kg    LMP  (LMP Unknown)    SpO2 96%    BMI 21.62 kg/m   Physical Exam Vitals and nursing note reviewed.  Constitutional:      General: She is not in acute distress.    Appearance: Normal appearance. She is well-developed. She is not ill-appearing, toxic-appearing or diaphoretic.  HENT:     Head: Normocephalic and atraumatic.     Nose: No congestion or rhinorrhea.     Mouth/Throat:     Mouth: Mucous membranes are dry.     Pharynx: No oropharyngeal exudate or posterior oropharyngeal erythema.  Eyes:     Extraocular Movements: Extraocular movements intact.     Conjunctiva/sclera: Conjunctivae normal.     Pupils: Pupils are equal, round, and reactive to light.  Cardiovascular:     Rate and Rhythm: Normal rate and regular rhythm.     Heart sounds: No murmur heard.   Pulmonary:     Effort: Pulmonary effort is normal. No respiratory distress.     Breath sounds: Normal breath sounds. No stridor. No wheezing, rhonchi or rales.  Chest:     Chest wall: No tenderness.  Abdominal:     General: Abdomen is flat.      Palpations: Abdomen is soft.     Tenderness: There is abdominal tenderness. There is right CVA tenderness. There is no left CVA tenderness, guarding or rebound.    Musculoskeletal:        General: Tenderness present.     Cervical back: Neck supple. No tenderness.       Back:     Right lower leg: No edema.     Left lower leg: No edema.  Skin:    General: Skin is warm and dry.     Capillary Refill: Capillary refill takes less than 2 seconds.     Findings: No erythema.  Neurological:     General: No focal deficit present.     Mental Status: She is alert.  Psychiatric:        Mood and Affect: Mood normal.     ED Results / Procedures / Treatments   Labs (all labs ordered are listed, but only abnormal results are displayed) Labs Reviewed  URINALYSIS, ROUTINE W REFLEX MICROSCOPIC - Abnormal; Notable for the following components:      Result Value   Color, Urine AMBER (*)    APPearance HAZY (*)    Hgb urine dipstick LARGE (*)    Protein, ur 30 (*)    Nitrite POSITIVE (*)    RBC / HPF >50 (*)    Bacteria, UA RARE (*)    All other components within normal limits  COMPREHENSIVE METABOLIC PANEL - Abnormal; Notable for the following components:   Glucose, Bld 102 (*)    BUN 21 (*)    Creatinine, Ser 1.06 (*)    Total Protein 8.2 (*)    All other components within normal limits  URINE CULTURE  CBC WITH DIFFERENTIAL/PLATELET  LACTIC ACID, PLASMA  I-STAT BETA HCG BLOOD, ED ([facility], [facility], [facility] ONLY)    EKG None  Radiology CT Renal Stone Study  Result Date: 02/24/2020 CLINICAL DATA:  Right flank pain, nausea, vomiting, urinary urgency EXAM: CT ABDOMEN AND PELVIS WITHOUT CONTRAST TECHNIQUE: Multidetector CT imaging of the abdomen and pelvis was performed following the standard protocol without IV contrast. COMPARISON:  04/07/2019 FINDINGS: Lower chest: The visualized lung bases are clear bilaterally. The visualized heart and pericardium are unremarkable. Hepatobiliary: Cholecystectomy has  been performed. The liver is unremarkable. No intra or extrahepatic biliary ductal dilation. Pancreas: Unremarkable Spleen: Unremarkable Adrenals/Urinary Tract: The adrenal glands are unremarkable. The kidneys are normal in size and position. One of the 2 previously identified nonobstructing calculi within the lower pole of the right kidney has now migrated into the distal right ureter 1-2 cm proximal to the left ureterovesicular junction and results in mild to moderate right hydronephrosis, new from prior examination. This calculus measures 3 mm in greatest dimension. Additional 3 mm nonobstructing calculi are noted within the lower pole of the kidneys bilaterally as well as the interpolar region of the left kidney. No ureteral calculi on the left. No hydronephrosis on the left. The bladder is decompressed. Stomach/Bowel: The large and small bowel are unremarkable. Appendix normal. No free intraperitoneal gas or fluid. Appendix normal. Vascular/Lymphatic: No pathologic adenopathy within the abdomen and pelvis. The abdominal vasculature is normal on this noncontrast examination. Reproductive: Uterus absent. No adnexal masses. Right tubal ligation clip or dropped surgical clip noted. Other: The rectum is unremarkable. Musculoskeletal: L4-5 anterior and posterior spinal fusion with instrumentation has been performed. Partial resection of the L5 spinous process and L4 spinous process is noted. No acute bone abnormality. IMPRESSION: Obstructing 3 mm calculus within the distal right ureter just proximal to the ureterovesicular junction resulting in mild to moderate right hydronephrosis. Superimposed mild bilateral nonobstructing nephrolithiasis. Electronically Signed   By: Fidela Salisbury MD   On: 02/24/2020 21:53    Procedures Procedures (including critical care time)  CRITICAL CARE Performed by: Gwenyth Allegra Deaaron Fulghum Total critical care time: 35 minutes Critical care time was exclusive of separately billable  procedures and treating other patients. Critical care was necessary to treat or prevent imminent or life-threatening deterioration. Critical care was time spent personally by me on the following activities: development of treatment plan with patient and/or surrogate as well as  nursing, discussions with consultants, evaluation of patient's response to treatment, examination of patient, obtaining history from patient or surrogate, ordering and performing treatments and interventions, ordering and review of laboratory studies, ordering and review of radiographic studies, pulse oximetry and re-evaluation of patient's condition.   Medications Ordered in ED Medications  cefTRIAXone (ROCEPHIN) 1 g in sodium chloride 0.9 % 100 mL IVPB (1 g Intravenous New Bag/Given 02/24/20 2335)  sodium chloride 0.9 % bolus 1,000 mL (0 mLs Intravenous Stopped 02/24/20 2315)  HYDROmorphone (DILAUDID) injection 1 mg (1 mg Intravenous Given 02/24/20 2154)  ondansetron (ZOFRAN) injection 4 mg (4 mg Intravenous Given 02/24/20 2154)  HYDROmorphone (DILAUDID) injection 1 mg (1 mg Intravenous Given 02/24/20 2335)  ondansetron (ZOFRAN) injection 4 mg (4 mg Intravenous Given 02/24/20 2334)    ED Course  I have reviewed the triage vital signs and the nursing notes.  Pertinent labs & imaging results that were available during my care of the patient were reviewed by me and considered in my medical decision making (see chart for details).    MDM Rules/Calculators/A&P                          Deliyah Muckle is a 49 y.o. female with a past medical history significant for hypertension, hypercholesterolemia, prior cholecystectomy, prior endometriosis surgery, lumbar spine surgery 3 months ago, and prior kidney stone requiring urological stenting who presents with right-sided back and flank pain as well as urinary urgency.  She reports that her symptoms are similar to when she is had either UTI or kidney stone in the past.  She reports that  her symptoms began at 3 AM today and she is been having pain starting in her right back and flank rating towards her right abdomen.  She reports this feels like a prior stone and says it feels like when she had a stent placed in the past.  She reports nausea and vomiting and increased urination with urgency.  She denies dysuria or change in appearance of the urine.  She reports she took home Percocet for her back surgery that did not significantly help the pain.  She reports the pain is a 7 out of 10 and she is tearful.  She reports no other trauma.  She denies constipation or diarrhea and had normal bowel movement today.  She denies any fevers, chills, congestion, cough.  She denies any URI symptoms.  She denies any Covid symptoms otherwise.  She reports no rashes to her skin.  She denies other complaints.  On exam, patient does have right-sided CVA tenderness with there is no rash overlying.  No tenderness in the right upper quadrant but there was some tenderness in the right lower quadrant.  No midline back tenderness on the surgical wound and it was well-appearing with no dehiscence, erythema, tenderness, or drainage.  No left-sided flank tenderness.  Abdomen otherwise nontender.  Normal bowel sounds.  Lungs clear and chest nontender.  Mouth is dry on exam.  Patient feels like she is dehydrated, will give fluids for rehydration given the nausea and vomiting today.  Will give nausea medication and pain medication.  We will get labs including urinalysis and kidney function as well as a CT stone study to look for large stone that may need intervention.  Patient is agreeable this plan, anticipate reassessment after work-up.  Based on her description of symptoms and exam, we have low suspicion there is some postoperative complication in her back, suspect  a kidney stone primarily.  11:05 PM CT scan shows obstructing stone on the right side of 3 mm.  Patient reports that her previous stent needed to be placed at  a 1.3 mm stone blocking as she has small ureters.  Patient also has evidence of UTI with nitrites and bacteria.  I spoke with urology given the patient's history of needing stenting and the now infected stone.  He recommends antibiotics for the UTI, continued pain and nausea medicine management, and admission to medicine overnight.  If she is not feeling better by the morning, he will likely take her for stenting.  Patient did not have AKI.  Her pain went from a 7 out of 10 to a 5 out of 10 on my reassessment.  She is still requiring medications.  She will be admitted to medicine for symptomatic management in hopes to prevent a surgical procedure in the morning for infected stone.    Final Clinical Impression(s) / ED Diagnoses Final diagnoses:  Acute cystitis with hematuria  Kidney stone on right side     Clinical Impression: 1. Acute cystitis with hematuria   2. Kidney stone on right side     Disposition: Admit  This note was prepared with assistance of Dragon voice recognition software. Occasional wrong-word or sound-a-like substitutions may have occurred due to the inherent limitations of voice recognition software.     Sia Gabrielsen, Gwenyth Allegra, MD 02/24/20 [phone]

## 2020-02-24 NOTE — H&P (Signed)
History and Physical   Anna Wilson NOB:[id number] DOB: 1970-12-21 DOA: 02/24/2020  Referring MD/NP/PA: Dr. Sherry Ruffing  PCP: System, Pcp Not In   Outpatient Specialists: Dr. Alyson Ingles, urology  Patient coming from: Home  Chief Complaint: Abdominal pain with nausea  HPI: Anna Wilson is a 49 y.o. female with medical history significant of GERD, hypertension, hyperlipidemia, anxiety disorder who presented with right flank pain dysuria and vomiting since early this morning.  She has had previous history of kidney stones.  Patient came in also complained of some fever some mild chills.  Evaluated in the ER with evidence of right CVA tenderness urine consistent with Pilo and imaging studies confirmed a 3 mm UPG stone with some hydronephrosis.  Patient is therefore being admitted with infected kidney stones and acute pyelonephritis..  ED Course: Temperature 98.9 blood pressure 134/95 pulse 96 respiratory rate of 16 oxygen sats 96% room air white count is 9.3 hemoglobin 12.5.  BUN 21 creatinine 1.06 and glucose 102.  Urinalysis showed large hemoglobin positive nitrite WBC 11-20 with rare bacteria.  CT renal stone showed a 3 mm UPJ stone.  Patient being admitted to the hospital with sepsis with infected renal stones  Review of Systems: As per HPI otherwise 10 point review of systems negative.    Past Medical History:  Diagnosis Date  . Anxiety   . Chest pain   . Esophageal spasm   . High cholesterol   . Hypertension   . Renal disorder     Past Surgical History:  Procedure Laterality Date  . BACK SURGERY    . CHOLECYSTECTOMY  2009  . ENDOMETRIOSIS SURGERY  1992  . HERNIA REPAIR  2009   WITH TUMMY TUCK  . KNEE ALC Left 2005  . KNEE ARTHROSCOPY W/ ACL RECONSTRUCTION Left 2017   LEFT KNEE ACL TEAR  . KNEE SURGERY Left 2004   SCAR TISSUE REMOVAL  . LEFT HEART CATH AND CORONARY ANGIOGRAPHY N/A 03/17/2017   Procedure: LEFT HEART CATH AND CORONARY ANGIOGRAPHY;  Surgeon: Burnell Blanks,  MD;  Location: [facility];  Service: Cardiovascular;  Laterality: N/A;  . STENT FOR KIDNEY STONES  2014  . TOTAL ABDOMINAL HYSTERECTOMY  2017     reports that she has quit smoking. She has never used smokeless tobacco. She reports current alcohol use. She reports that she does not use drugs.  Allergies  Allergen Reactions  . Nsaids Other (See Comments)    Ulcers  . Sulfa Antibiotics Other (See Comments)    Pt states "I bleed from my body orficese when I take sulfa drugs"    Family History  Problem Relation Age of Onset  . Hypertension Mother   . Hypertension Father   . Hypothyroidism Brother   . Rheum arthritis Daughter   . Asthma Daughter   . Hypothyroidism Brother   . Asthma Son      Prior to Admission medications   Medication Sig Start Date End Date Taking? Authorizing Provider  acetaminophen (TYLENOL) 500 MG tablet Take 500-1,000 mg by mouth every 6 (six) hours as needed (for pain/headache.).     [provider]  amLODipine-valsartan (EXFORGE) 5-320 MG tablet Take 1 tablet by mouth daily.     [provider]  atorvastatin (LIPITOR) 10 MG tablet Take 10 mg by mouth daily.    [provider]  Coenzyme Q10 (COQ10) 100 MG CAPS Take 100 mg by mouth daily.    [provider]  conjugated estrogens (PREMARIN) vaginal cream Place 1  g vaginally every 3 (three) days. 1 oz of cream vaginally every 3 days.    [provider]  estradiol (ESTRACE) 1 MG tablet Take 1 mg by mouth at bedtime.     [provider]  gabapentin (NEURONTIN) 100 MG capsule Take 200 mg by mouth 3 (three) times daily. 10/13/16 10/13/17  [provider]  nitroGLYCERIN (NITROSTAT) 0.4 MG SL tablet Place 0.4 mg under the tongue every 5 (five) minutes as needed for chest pain.    [provider]  pantoprazole (PROTONIX) 40 MG tablet Take 40 mg by mouth 2 (two) times daily.    [provider]  pyridOXINE (VITAMIN B-6) 100 MG tablet Take  100 mg by mouth daily.    [provider]  SUMAtriptan (IMITREX) 100 MG tablet Take 100 mg by mouth every 2 (two) hours as needed for migraine or headache.  05/26/16   [provider]    Physical Exam: Vitals:   02/24/20 1741 02/24/20 1758 02/24/20 2205  BP: (!) 133/94  (!) 134/95  Pulse: 96  62  Resp: 16  13  Temp: 98.9 F (37.2 C)    TempSrc: Oral    SpO2: 96%  99%  Weight:  56.7 kg   Height:  5' 3.75" (1.619 m)       Constitutional: Acutely ill looking, mild distress Vitals:   02/24/20 1741 02/24/20 1758 02/24/20 2205  BP: (!) 133/94  (!) 134/95  Pulse: 96  62  Resp: 16  13  Temp: 98.9 F (37.2 C)    TempSrc: Oral    SpO2: 96%  99%  Weight:  56.7 kg   Height:  5' 3.75" (1.619 m)    Eyes: PERRL, lids and conjunctivae normal ENMT: Mucous membranes are dry. Posterior pharynx clear of any exudate or lesions.Normal dentition.  Neck: normal, supple, no masses, no thyromegaly Respiratory: clear to auscultation bilaterally, no wheezing, no crackles. Normal respiratory effort. No accessory muscle use.  Cardiovascular: Regular rate and rhythm, no murmurs / rubs / gallops. No extremity edema. 2+ pedal pulses. No carotid bruits.  Abdomen: no tenderness, no masses palpated. No hepatosplenomegaly. Bowel sounds positive.  Positive CVA tenderness on the right Musculoskeletal: no clubbing / cyanosis. No joint deformity upper and lower extremities. Good ROM, no contractures. Normal muscle tone.  Skin: no rashes, lesions, ulcers. No induration Neurologic: CN 2-12 grossly intact. Sensation intact, DTR normal. Strength 5/5 in all 4.  Psychiatric: Normal judgment and insight. Alert and oriented x 3. Normal mood.     Labs on Admission: I have personally reviewed following labs and imaging studies  CBC: Recent Labs  Lab 02/24/20 2141  WBC 9.3  NEUTROABS 6.7  HGB 12.5  HCT 38.7  MCV 87.8  PLT 836   Basic Metabolic Panel: Recent Labs  Lab 02/24/20 2141  NA  142  K 3.6  CL 108  CO2 25  GLUCOSE 102*  BUN 21*  CREATININE 1.06*  CALCIUM 9.9   GFR: Estimated Creatinine Clearance: 56 mL/min (A) (by C-G formula based on SCr of 1.06 mg/dL (H)). Liver Function Tests: Recent Labs  Lab 02/24/20 2141  AST 23  ALT 12  ALKPHOS 51  BILITOT 0.6  PROT 8.2*  ALBUMIN 4.8   No results for input(s): LIPASE, AMYLASE in the last 168 hours. No results for input(s): AMMONIA in the last 168 hours. Coagulation Profile: No results for input(s): INR, PROTIME in the last 168 hours. Cardiac Enzymes: No results for input(s): CKTOTAL, CKMB, CKMBINDEX, TROPONINI  in the last 168 hours. BNP (last 3 results) No results for input(s): PROBNP in the last 8760 hours. HbA1C: No results for input(s): HGBA1C in the last 72 hours. CBG: No results for input(s): GLUCAP in the last 168 hours. Lipid Profile: No results for input(s): CHOL, HDL, LDLCALC, TRIG, CHOLHDL, LDLDIRECT in the last 72 hours. Thyroid Function Tests: No results for input(s): TSH, T4TOTAL, FREET4, T3FREE, THYROIDAB in the last 72 hours. Anemia Panel: No results for input(s): VITAMINB12, FOLATE, FERRITIN, TIBC, IRON, RETICCTPCT in the last 72 hours. Urine analysis:    Component Value Date/Time   COLORURINE AMBER (A) 02/24/2020 2141   APPEARANCEUR HAZY (A) 02/24/2020 2141   LABSPEC 1.020 02/24/2020 2141   PHURINE 5.0 02/24/2020 2141   GLUCOSEU NEGATIVE 02/24/2020 2141   HGBUR LARGE (A) 02/24/2020 2141   BILIRUBINUR NEGATIVE 02/24/2020 2141   [place] NEGATIVE 02/24/2020 2141   PROTEINUR 30 (A) 02/24/2020 2141   NITRITE POSITIVE (A) 02/24/2020 2141   LEUKOCYTESUR NEGATIVE 02/24/2020 2141   Sepsis Labs: @LABRCNTIP (procalcitonin:4,lacticidven:4) )No results found for this or any previous visit (from the past 240 hour(s)).   Radiological Exams on Admission: CT Renal Stone Study  Result Date: 02/24/2020 CLINICAL DATA:  Right flank pain, nausea, vomiting, urinary urgency EXAM: CT ABDOMEN AND  PELVIS WITHOUT CONTRAST TECHNIQUE: Multidetector CT imaging of the abdomen and pelvis was performed following the standard protocol without IV contrast. COMPARISON:  04/07/2019 FINDINGS: Lower chest: The visualized lung bases are clear bilaterally. The visualized heart and pericardium are unremarkable. Hepatobiliary: Cholecystectomy has been performed. The liver is unremarkable. No intra or extrahepatic biliary ductal dilation. Pancreas: Unremarkable Spleen: Unremarkable Adrenals/Urinary Tract: The adrenal glands are unremarkable. The kidneys are normal in size and position. One of the 2 previously identified nonobstructing calculi within the lower pole of the right kidney has now migrated into the distal right ureter 1-2 cm proximal to the left ureterovesicular junction and results in mild to moderate right hydronephrosis, new from prior examination. This calculus measures 3 mm in greatest dimension. Additional 3 mm nonobstructing calculi are noted within the lower pole of the kidneys bilaterally as well as the interpolar region of the left kidney. No ureteral calculi on the left. No hydronephrosis on the left. The bladder is decompressed. Stomach/Bowel: The large and small bowel are unremarkable. Appendix normal. No free intraperitoneal gas or fluid. Appendix normal. Vascular/Lymphatic: No pathologic adenopathy within the abdomen and pelvis. The abdominal vasculature is normal on this noncontrast examination. Reproductive: Uterus absent. No adnexal masses. Right tubal ligation clip or dropped surgical clip noted. Other: The rectum is unremarkable. Musculoskeletal: L4-5 anterior and posterior spinal fusion with instrumentation has been performed. Partial resection of the L5 spinous process and L4 spinous process is noted. No acute bone abnormality. IMPRESSION: Obstructing 3 mm calculus within the distal right ureter just proximal to the ureterovesicular junction resulting in mild to moderate right hydronephrosis.  Superimposed mild bilateral nonobstructing nephrolithiasis. Electronically Signed   By: Fidela Salisbury MD   On: 02/24/2020 21:53    EKG: Independently reviewed.  Sinus tachycardia no significant findings  Assessment/Plan Principal Problem:   Nephrolithiasis Active Problems:   Benign essential HTN   GERD (gastroesophageal reflux disease)   Hyperlipidemia   Pyelonephritis     #1 infected nephrolithiasis: Patient will be admitted.  Initiated on IV antibiotics.  Urine and blood cultures to be obtained.  Urology consulted and plan is to treat symptomatically and if still symptomatic tomorrow may require surgical intervention.  #2 acute pyelonephritis: Patient  will continue on antibiotics and follow cultures closely.  #3 benign essential hypertension: Continue home regimen of blood pressure control  #4 GERD: Continue PPI  #5 hyperlipidemia: Continue statin   DVT prophylaxis: Lovenox Code Status: Full code Family Communication: No family at bedside Disposition Plan: Home Consults called: Dr. Alyson Ingles urology Admission status: Inpatient  Severity of Illness: The appropriate patient status for this patient is INPATIENT. Inpatient status is judged to be reasonable and necessary in order to provide the required intensity of service to ensure the patient's safety. The patient's presenting symptoms, physical exam findings, and initial radiographic and laboratory data in the context of their chronic comorbidities is felt to place them at high risk for further clinical deterioration. Furthermore, it is not anticipated that the patient will be medically stable for discharge from the hospital within 2 midnights of admission. The following factors support the patient status of inpatient.   " The patient's presenting symptoms include dysuria fever. " The worrisome physical exam findings include CVA tenderness. " The initial radiographic and laboratory data are worrisome because of evidence of  nephrolithiasis and hydronephrosis. " The chronic co-morbidities include recurrent kidney stone.   * I certify that at the point of admission it is my clinical judgment that the patient will require inpatient hospital care spanning beyond 2 midnights from the point of admission due to high intensity of service, high risk for further deterioration and high frequency of surveillance required.Barbette Merino MD [facility] Pager [phone]  If 7PM-7AM, please contact night-coverage [email or web address] Password [facility]  02/24/2020, 11:36 PM

## 2020-02-24 NOTE — ED Triage Notes (Signed)
Patient c/o right flank pain since 0300 today. Patient states a history of kidney stones. Patient c/o dysuria and has had vomiting x 3.

## 2020-02-25 ENCOUNTER — Inpatient Hospital Stay (HOSPITAL_COMMUNITY): Payer: No Typology Code available for payment source

## 2020-02-25 ENCOUNTER — Encounter (HOSPITAL_COMMUNITY)
Admission: EM | Disposition: A | Discharge status: Home / Self Care | Payer: Self-pay | Source: Home / Self Care | Attending: Family Medicine

## 2020-02-25 ENCOUNTER — Inpatient Hospital Stay (HOSPITAL_COMMUNITY): Payer: No Typology Code available for payment source | Admitting: Certified Registered Nurse Anesthetist

## 2020-02-25 HISTORY — PX: CYSTOSCOPY/RETROGRADE/URETEROSCOPY/STONE EXTRACTION WITH BASKET: SHX5317

## 2020-02-25 LAB — COMPREHENSIVE METABOLIC PANEL
ALT: 12 U/L (ref 0–44)
AST: 18 U/L (ref 15–41)
Albumin: 4.1 g/dL (ref 3.5–5.0)
Alkaline Phosphatase: 47 U/L (ref 38–126)
Anion gap: 9 (ref 5–15)
BUN: 18 mg/dL (ref 6–20)
CO2: 24 mmol/L (ref 22–32)
Calcium: 9 mg/dL (ref 8.9–10.3)
Chloride: 111 mmol/L (ref 98–111)
Creatinine, Ser: 1.13 mg/dL — ABNORMAL HIGH (ref 0.44–1.00)
GFR calc Af Amer: >60 mL/min (ref >60)
GFR calc non Af Amer: 58 mL/min — ABNORMAL LOW (ref >60)
Glucose, Bld: 130 mg/dL — ABNORMAL HIGH (ref 70–99)
Potassium: 3 mmol/L — ABNORMAL LOW (ref 3.5–5.1)
Sodium: 144 mmol/L (ref 135–145)
Total Bilirubin: 0.5 mg/dL (ref 0.3–1.2)
Total Protein: 6.9 g/dL (ref 6.5–8.1)

## 2020-02-25 LAB — CBC
HCT: 37.4 % (ref 36.0–46.0)
Hemoglobin: 11.8 g/dL — ABNORMAL LOW (ref 12.0–15.0)
MCH: 28.2 pg (ref 26.0–34.0)
MCHC: 31.6 g/dL (ref 30.0–36.0)
MCV: 89.5 fL (ref 80.0–100.0)
Platelets: 296 10*3/uL (ref 150–400)
RBC: 4.18 MIL/uL (ref 3.87–5.11)
RDW: 12.3 % (ref 11.5–15.5)
WBC: 7.8 10*3/uL (ref 4.0–10.5)
nRBC: 0 % (ref 0.0–0.2)

## 2020-02-25 LAB — SARS CORONAVIRUS 2 BY RT PCR (HOSPITAL ORDER, PERFORMED IN ~~LOC~~ HOSPITAL LAB): SARS Coronavirus 2: NEGATIVE

## 2020-02-25 LAB — HIV ANTIBODY (ROUTINE TESTING W REFLEX): HIV Screen 4th Generation wRfx: NONREACTIVE

## 2020-02-25 SURGERY — CYSTOSCOPY, WITH CALCULUS REMOVAL USING BASKET
Anesthesia: General | Site: Ureter | Laterality: Right

## 2020-02-25 MED ORDER — PROMETHAZINE HCL 25 MG/ML IJ SOLN: 6.2500 mg | INTRAMUSCULAR | Status: DC | PRN

## 2020-02-25 MED ORDER — DIPHENHYDRAMINE HCL 50 MG/ML IJ SOLN
INTRAMUSCULAR | Status: AC
  Filled 2020-02-25: qty 1

## 2020-02-25 MED ORDER — ONDANSETRON HCL 4 MG PO TABS: 4.0000 mg | ORAL_TABLET | Freq: Four times a day (QID) | ORAL | Status: DC | PRN

## 2020-02-25 MED ORDER — PROMETHAZINE HCL 25 MG/ML IJ SOLN: 12.5000 mg | Freq: Three times a day (TID) | INTRAMUSCULAR | Status: DC | PRN

## 2020-02-25 MED ORDER — SODIUM CHLORIDE 0.9 % IR SOLN
Status: DC | PRN
  Administered 2020-02-25: 3000 mL

## 2020-02-25 MED ORDER — ACETAMINOPHEN 500 MG PO TABS
ORAL_TABLET | ORAL | Status: AC
  Filled 2020-02-25: qty 2

## 2020-02-25 MED ORDER — HYDROMORPHONE HCL 1 MG/ML IJ SOLN
1.0000 mg | INTRAMUSCULAR | Status: DC | PRN
  Administered 2020-02-25 (×3): 1 mg via INTRAVENOUS
  Filled 2020-02-25 (×4): qty 1

## 2020-02-25 MED ORDER — FENTANYL CITRATE (PF) 100 MCG/2ML IJ SOLN
INTRAMUSCULAR | Status: AC
  Filled 2020-02-25: qty 2

## 2020-02-25 MED ORDER — ONDANSETRON HCL 4 MG/2ML IJ SOLN
INTRAMUSCULAR | Status: DC | PRN
  Administered 2020-02-25: 4 mg via INTRAVENOUS

## 2020-02-25 MED ORDER — PROMETHAZINE HCL 25 MG/ML IJ SOLN
12.5000 mg | Freq: Once | INTRAMUSCULAR | Status: AC
  Administered 2020-02-25: 12.5 mg via INTRAVENOUS
  Filled 2020-02-25: qty 1

## 2020-02-25 MED ORDER — SCOPOLAMINE 1 MG/3DAYS TD PT72
MEDICATED_PATCH | TRANSDERMAL | Status: AC
  Filled 2020-02-25: qty 1

## 2020-02-25 MED ORDER — DIPHENHYDRAMINE HCL 50 MG/ML IJ SOLN
INTRAMUSCULAR | Status: DC | PRN
Start: 2020-02-25 — End: 2020-02-25
  Administered 2020-02-25: 12.5 mg via INTRAVENOUS

## 2020-02-25 MED ORDER — PROPOFOL 10 MG/ML IV BOLUS
INTRAVENOUS | Status: DC | PRN
  Administered 2020-02-25: 120 mg via INTRAVENOUS

## 2020-02-25 MED ORDER — MIDAZOLAM HCL 2 MG/2ML IJ SOLN
INTRAMUSCULAR | Status: AC
  Filled 2020-02-25: qty 2

## 2020-02-25 MED ORDER — DULOXETINE HCL 60 MG PO CPEP
60.0000 mg | ORAL_CAPSULE | Freq: Every day | ORAL | Status: DC
  Administered 2020-02-26: 60 mg via ORAL
  Filled 2020-02-25 (×2): qty 1

## 2020-02-25 MED ORDER — ONDANSETRON HCL 4 MG/2ML IJ SOLN
INTRAMUSCULAR | Status: AC
  Filled 2020-02-25: qty 2

## 2020-02-25 MED ORDER — ESCITALOPRAM OXALATE 10 MG PO TABS
10.0000 mg | ORAL_TABLET | Freq: Every day | ORAL | Status: DC
  Administered 2020-02-26: 10 mg via ORAL
  Filled 2020-02-25 (×2): qty 1

## 2020-02-25 MED ORDER — TOPIRAMATE 25 MG PO TABS
50.0000 mg | ORAL_TABLET | Freq: Two times a day (BID) | ORAL | Status: DC
  Administered 2020-02-25 – 2020-02-26 (×2): 50 mg via ORAL
  Filled 2020-02-25 (×2): qty 2

## 2020-02-25 MED ORDER — ENOXAPARIN SODIUM 40 MG/0.4ML ~~LOC~~ SOLN: 40.0000 mg | SUBCUTANEOUS | Status: DC

## 2020-02-25 MED ORDER — ACETAMINOPHEN 325 MG PO TABS
650.0000 mg | ORAL_TABLET | Freq: Four times a day (QID) | ORAL | Status: DC | PRN
  Administered 2020-02-25: 650 mg via ORAL
  Filled 2020-02-25: qty 2

## 2020-02-25 MED ORDER — LIDOCAINE 2% (20 MG/ML) 5 ML SYRINGE
INTRAMUSCULAR | Status: DC | PRN
  Administered 2020-02-25: 80 mg via INTRAVENOUS

## 2020-02-25 MED ORDER — DEXAMETHASONE SODIUM PHOSPHATE 10 MG/ML IJ SOLN
INTRAMUSCULAR | Status: AC
  Filled 2020-02-25: qty 1

## 2020-02-25 MED ORDER — FENTANYL CITRATE (PF) 100 MCG/2ML IJ SOLN
INTRAMUSCULAR | Status: DC | PRN
  Administered 2020-02-25 (×2): 50 ug via INTRAVENOUS

## 2020-02-25 MED ORDER — IOHEXOL 300 MG/ML  SOLN
INTRAMUSCULAR | Status: DC | PRN
  Administered 2020-02-25: 7 mL

## 2020-02-25 MED ORDER — SCOPOLAMINE 1 MG/3DAYS TD PT72
1.0000 | MEDICATED_PATCH | Freq: Once | TRANSDERMAL | Status: AC
  Administered 2020-02-25: 1 via TRANSDERMAL

## 2020-02-25 MED ORDER — PHENYLEPHRINE 40 MCG/ML (10ML) SYRINGE FOR IV PUSH (FOR BLOOD PRESSURE SUPPORT)
PREFILLED_SYRINGE | INTRAVENOUS | Status: AC
  Filled 2020-02-25: qty 10

## 2020-02-25 MED ORDER — PHENYLEPHRINE 40 MCG/ML (10ML) SYRINGE FOR IV PUSH (FOR BLOOD PRESSURE SUPPORT)
PREFILLED_SYRINGE | INTRAVENOUS | Status: DC | PRN
  Administered 2020-02-25: 80 ug via INTRAVENOUS

## 2020-02-25 MED ORDER — LIDOCAINE 2% (20 MG/ML) 5 ML SYRINGE
INTRAMUSCULAR | Status: AC
  Filled 2020-02-25: qty 5

## 2020-02-25 MED ORDER — LACTATED RINGERS IV SOLN
INTRAVENOUS | Status: DC | PRN
Start: 2020-02-25 — End: 2020-02-25

## 2020-02-25 MED ORDER — POTASSIUM CHLORIDE 10 MEQ/100ML IV SOLN
10.0000 meq | INTRAVENOUS | Status: AC
  Administered 2020-02-25 (×3): 10 meq via INTRAVENOUS
  Filled 2020-02-25 (×3): qty 100

## 2020-02-25 MED ORDER — SODIUM CHLORIDE 0.9 % IV SOLN
1.0000 g | INTRAVENOUS | Status: DC
  Administered 2020-02-25 (×2): 1 g via INTRAVENOUS
  Filled 2020-02-25: qty 10
  Filled 2020-02-25: qty 1

## 2020-02-25 MED ORDER — MIDAZOLAM HCL 2 MG/2ML IJ SOLN
INTRAMUSCULAR | Status: DC | PRN
  Administered 2020-02-25: 2 mg via INTRAVENOUS

## 2020-02-25 MED ORDER — ONDANSETRON HCL 4 MG/2ML IJ SOLN
4.0000 mg | Freq: Four times a day (QID) | INTRAMUSCULAR | Status: DC | PRN
  Administered 2020-02-25 (×2): 4 mg via INTRAVENOUS
  Filled 2020-02-25 (×2): qty 2

## 2020-02-25 MED ORDER — DEXAMETHASONE SODIUM PHOSPHATE 4 MG/ML IJ SOLN
INTRAMUSCULAR | Status: DC | PRN
  Administered 2020-02-25: 10 mg via INTRAVENOUS

## 2020-02-25 MED ORDER — SODIUM CHLORIDE 0.9 % IV SOLN: INTRAVENOUS | Status: DC

## 2020-02-25 MED ORDER — PROPOFOL 10 MG/ML IV BOLUS
INTRAVENOUS | Status: AC
  Filled 2020-02-25: qty 20

## 2020-02-25 MED ORDER — PANTOPRAZOLE SODIUM 40 MG PO TBEC
40.0000 mg | DELAYED_RELEASE_TABLET | Freq: Two times a day (BID) | ORAL | Status: DC
  Administered 2020-02-25 – 2020-02-26 (×2): 40 mg via ORAL
  Filled 2020-02-25 (×2): qty 1

## 2020-02-25 MED ORDER — FENTANYL CITRATE (PF) 100 MCG/2ML IJ SOLN: 25.0000 ug | INTRAMUSCULAR | Status: DC | PRN

## 2020-02-25 MED ORDER — ACETAMINOPHEN 650 MG RE SUPP: 650.0000 mg | Freq: Four times a day (QID) | RECTAL | Status: DC | PRN

## 2020-02-25 MED ORDER — ACETAMINOPHEN 500 MG PO TABS
1000.0000 mg | ORAL_TABLET | Freq: Once | ORAL | Status: AC
  Administered 2020-02-25: 1000 mg via ORAL

## 2020-02-25 SURGICAL SUPPLY — 20 items
BAG URO CATCHER STRL LF (MISCELLANEOUS) ×4 IMPLANT
CATH INTERMIT  6FR 70CM (CATHETERS) ×4 IMPLANT
CLOTH BEACON ORANGE TIMEOUT ST (SAFETY) ×4 IMPLANT
EXTRACTOR STONE NITINOL NGAGE (UROLOGICAL SUPPLIES) ×4 IMPLANT
FIBER LASER TRAC TIP (UROLOGICAL SUPPLIES) IMPLANT
GLOVE BIO SURGEON STRL SZ8 (GLOVE) ×4 IMPLANT
GOWN STRL REUS W/TWL XL LVL3 (GOWN DISPOSABLE) ×4 IMPLANT
GUIDEWIRE ANG ZIPWIRE 038X150 (WIRE) ×4 IMPLANT
GUIDEWIRE STR DUAL SENSOR (WIRE) IMPLANT
IV NS 1000ML (IV SOLUTION) ×4
IV NS 1000ML BAXH (IV SOLUTION) ×2 IMPLANT
KIT TURNOVER KIT A (KITS) IMPLANT
MANIFOLD NEPTUNE II (INSTRUMENTS) ×4 IMPLANT
PACK CYSTO (CUSTOM PROCEDURE TRAY) ×4 IMPLANT
SHEATH URETERAL 12FRX35CM (MISCELLANEOUS) IMPLANT
STENT URET 6FRX26 CONTOUR (STENTS) IMPLANT
TUBE FEEDING 8FR 16IN STR KANG (MISCELLANEOUS) IMPLANT
TUBING CONNECTING 10 (TUBING) ×3 IMPLANT
TUBING CONNECTING 10' (TUBING) ×1
TUBING UROLOGY SET (TUBING) ×4 IMPLANT

## 2020-02-25 NOTE — Progress Notes (Addendum)
PROGRESS NOTE    Shylin Keizer  HLK:[id number] DOB: 01-22-1971 DOA: 02/24/2020 PCP: System, Pcp Not In   Chief Complaint  Patient presents with  . Flank Pain  . Emesis  . Dysuria    Brief Narrative:  Daphney Hopke is Tayshon Winker 49 y.o. female with medical history significant of GERD, hypertension, hyperlipidemia, anxiety disorder who presented with right flank pain dysuria and vomiting since early this morning.  She has had previous history of kidney stones.  Patient came in also complained of some fever some mild chills.  Evaluated in the ER with evidence of right CVA tenderness urine consistent with Pilo and imaging studies confirmed Rebekha Diveley 3 mm UPG stone with some hydronephrosis.  Patient is therefore being admitted with infected kidney stones and acute pyelonephritis..  ED Course: Temperature 98.9 blood pressure 134/95 pulse 96 respiratory rate of 16 oxygen sats 96% room air white count is 9.3 hemoglobin 12.5.  BUN 21 creatinine 1.06 and glucose 102.  Urinalysis showed large hemoglobin positive nitrite WBC 11-20 with rare bacteria.  CT renal stone showed Billye Nydam 3 mm UPJ stone.  Patient being admitted to the hospital with sepsis with infected renal stones  Assessment & Plan:   Principal Problem:   Nephrolithiasis Active Problems:   Benign essential HTN   GERD (gastroesophageal reflux disease)   Hyperlipidemia   Pyelonephritis  #1 Pyelonephritis  Hydronephrosis with obstructing 3 mm calculus:  Continue abx Follow urine culture, UA with squams, nitrite positive, >50 RBC's, 11-20 WBC's Urology c/s, appreciate recommendations  Now s/p R retrograde pyelography, intraoperative fluroscopy, R ureteroscopic stone manipulation with basket extraction, R 6x26 JJ stent placement Urine culture pending  #3 benign essential hypertension: Continue home regimen of blood pressure control  #4 GERD: Continue PPI  #5 hyperlipidemia: Continue statin  # HTN: hold home amlodipine/valstartan  # ADHD: hold hoem  adderall   # Depression  Anxiety: on both cymbalta and lexapro, will clarify whether this is correct with pt - pt notes this is correct - rec outpatient f/u   # Ankylosing Spondylosis: on humira q14 days  # S/p recent lumbar fusion   # Hypokalemia: replace and follow  DVT prophylaxis: SCD Code Status: full  Family Communication: husband asleep at bedside Disposition:   Status is: Inpatient  Remains inpatient appropriate because:Inpatient level of care appropriate due to severity of illness   Dispo: The patient is from: Home              Anticipated d/c is to: Home              Anticipated d/c date is: 1 day              Patient currently is not medically stable to d/c. Consultants:   urology  Procedures: 7/17 Procedure: 1. cystoscopy 2.  right retrograde pyelography 3.  Intraoperative fluoroscopy, under one hour, with interpretation 4.  Right ureteroscopic stone manipulation with basket extraction 5.  Right 6 x 26 JJ stent placement  Antimicrobials:  Anti-infectives (From admission, onward)   Start     Dose/Rate Route Frequency Ordered Stop   02/25/20 0235  cefTRIAXone (ROCEPHIN) 1 g in sodium chloride 0.9 % 100 mL IVPB     Discontinue     1 g 200 mL/hr over 30 Minutes Intravenous Every 24 hours 02/25/20 0235     02/24/20 2300  cefTRIAXone (ROCEPHIN) 1 g in sodium chloride 0.9 % 100 mL IVPB        1 g 200 mL/hr over  30 Minutes Intravenous  Once 02/24/20 2258 02/25/20 0015     Subjective: No new complaints today  Objective: Vitals:   02/25/20 1500 02/25/20 1511 02/25/20 1519 02/25/20 1526  BP: 128/80 124/84 136/88   Pulse: (!) 52 (!) 54 (!) 50   Resp: 12 14 14 18   Temp:   98.1 F (36.7 C)   TempSrc:   Oral   SpO2: 100% 100% 100% 100%  Weight:      Height:        Intake/Output Summary (Last 24 hours) at 02/25/2020 1614 Last data filed at 02/25/2020 1435 Gross per 24 hour  Intake 1594.76 ml  Output 1000 ml  Net 594.76 ml   Filed Weights   02/24/20  1758  Weight: 56.7 kg    Examination:  General exam: Appears calm and comfortable  Respiratory system: Clear to auscultation. Respiratory effort normal. Cardiovascular system: S1 & S2 heard, RRR Gastrointestinal system: R flank pain, mild r sided abdominal pain Central nervous system: Alert and oriented. No focal neurological deficits. Extremities: no LEE Skin: No rashes, lesions or ulcers Psychiatry: Judgement and insight appear normal. Mood & affect appropriate.     Data Reviewed: I have personally reviewed following labs and imaging studies  CBC: Recent Labs  Lab 02/24/20 2141 02/25/20 0236  WBC 9.3 7.8  NEUTROABS 6.7  --   HGB 12.5 11.8*  HCT 38.7 37.4  MCV 87.8 89.5  PLT 332 678    Basic Metabolic Panel: Recent Labs  Lab 02/24/20 2141 02/25/20 0704  NA 142 144  K 3.6 3.0*  CL 108 111  CO2 25 24  GLUCOSE 102* 130*  BUN 21* 18  CREATININE 1.06* 1.13*  CALCIUM 9.9 9.0    GFR: Estimated Creatinine Clearance: 52.6 mL/min ([place]) (by C-G formula based on SCr of 1.13 mg/dL (H)).  Liver Function Tests: Recent Labs  Lab 02/24/20 2141 02/25/20 0704  AST 23 18  ALT 12 12  ALKPHOS 51 47  BILITOT 0.6 0.5  PROT 8.2* 6.9  ALBUMIN 4.8 4.1    CBG: No results for input(s): GLUCAP in the last 168 hours.   Recent Results (from the past 240 hour(s))  SARS Coronavirus 2 by RT PCR (hospital order, performed in [facility] hospital lab) Nasopharyngeal Nasopharyngeal Swab     Status: None   Collection Time: 02/25/20  4:50 AM   Specimen: Nasopharyngeal Swab  Result Value Ref Range Status   SARS Coronavirus 2 NEGATIVE NEGATIVE Final    Comment: (NOTE) SARS-CoV-2 target nucleic acids are NOT DETECTED.  The SARS-CoV-2 RNA is generally detectable in upper and lower respiratory specimens during the acute phase of infection. The lowest concentration of SARS-CoV-2 viral copies this assay can detect is 250 copies / mL. Gill Delrossi negative result does not preclude SARS-CoV-2  infection and should not be used as the sole basis for treatment or other patient management decisions.  Demario Faniel negative result may occur with improper specimen collection / handling, submission of specimen other than nasopharyngeal swab, presence of viral mutation(s) within the areas targeted by this assay, and inadequate number of viral copies (<250 copies / mL). Glorie Dowlen negative result must be combined with clinical observations, patient history, and epidemiological information.  Fact Sheet for Patients:   StrictlyIdeas.no  Fact Sheet for Healthcare Providers: BankingDealers.co.za  This test is not yet approved or  cleared by the [place] FDA and has been authorized for detection and/or diagnosis of SARS-CoV-2 by FDA under an Emergency Use Authorization (EUA).  This  EUA will remain in effect (meaning this test can be used) for the duration of the COVID-19 declaration under Section 564(b)(1) of the Act, 21 U.S.C. section 360bbb-3(b)(1), unless the authorization is terminated or revoked sooner.  Performed at [facility], [place] [address] [place], [place] [address]          Radiology Studies: DG C-Arm 1-60 Min-No Report  Result Date: 02/25/2020 Fluoroscopy was utilized by the requesting physician.  No radiographic interpretation.   CT Renal Stone Study  Result Date: 02/24/2020 CLINICAL DATA:  Right flank pain, nausea, vomiting, urinary urgency EXAM: CT ABDOMEN AND PELVIS WITHOUT CONTRAST TECHNIQUE: Multidetector CT imaging of the abdomen and pelvis was performed following the standard protocol without IV contrast. COMPARISON:  04/07/2019 FINDINGS: Lower chest: The visualized lung bases are clear bilaterally. The visualized heart and pericardium are unremarkable. Hepatobiliary: Cholecystectomy has been performed. The liver is unremarkable. No intra or extrahepatic biliary ductal dilation. Pancreas: Unremarkable Spleen:  Unremarkable Adrenals/Urinary Tract: The adrenal glands are unremarkable. The kidneys are normal in size and position. One of the 2 previously identified nonobstructing calculi within the lower pole of the right kidney has now migrated into the distal right ureter 1-2 cm proximal to the left ureterovesicular junction and results in mild to moderate right hydronephrosis, new from prior examination. This calculus measures 3 mm in greatest dimension. Additional 3 mm nonobstructing calculi are noted within the lower pole of the kidneys bilaterally as well as the interpolar region of the left kidney. No ureteral calculi on the left. No hydronephrosis on the left. The bladder is decompressed. Stomach/Bowel: The large and small bowel are unremarkable. Appendix normal. No free intraperitoneal gas or fluid. Appendix normal. Vascular/Lymphatic: No pathologic adenopathy within the abdomen and pelvis. The abdominal vasculature is normal on this noncontrast examination. Reproductive: Uterus absent. No adnexal masses. Right tubal ligation clip or dropped surgical clip noted. Other: The rectum is unremarkable. Musculoskeletal: L4-5 anterior and posterior spinal fusion with instrumentation has been performed. Partial resection of the L5 spinous process and L4 spinous process is noted. No acute bone abnormality. IMPRESSION: Obstructing 3 mm calculus within the distal right ureter just proximal to the ureterovesicular junction resulting in mild to moderate right hydronephrosis. Superimposed mild bilateral nonobstructing nephrolithiasis. Electronically Signed   By: Fidela Salisbury MD   On: 02/24/2020 21:53        Scheduled Meds: . enoxaparin (LOVENOX) injection  40 mg Subcutaneous Q24H   Continuous Infusions: . sodium chloride 100 mL/hr at 02/25/20 1534  . cefTRIAXone (ROCEPHIN)  IV Stopped (02/25/20 0325)     LOS: 1 day    Time spent: over 30 min    Fayrene Helper, MD [facility]   To contact the  attending provider between 7A-7P or the covering provider during after hours 7P-7A, please log into the web site [email or web address] and access using universal [place] password for that web site. If you do not have the password, please call the hospital operator.  02/25/2020, 4:14 PM

## 2020-02-25 NOTE — Progress Notes (Signed)
Pt stable on return from pacu. Pt denies pain or other needs. RN will continue to monitor.

## 2020-02-25 NOTE — Anesthesia Postprocedure Evaluation (Signed)
Anesthesia Post Note  Patient: Anna Wilson  Procedure(s) Performed: CYSTOSCOPY RETROGRADE/ URETEROSCOPY/STONE EXTRACTION WITH BASKET/URETERAL STENT PLACEMENT  (Right Ureter)     Patient location during evaluation: PACU Anesthesia Type: General Level of consciousness: awake and alert, awake and oriented Pain management: pain level controlled Vital Signs Assessment: post-procedure vital signs reviewed and stable Respiratory status: spontaneous breathing, nonlabored ventilation, respiratory function stable and patient connected to nasal cannula oxygen Cardiovascular status: blood pressure returned to baseline and stable Postop Assessment: no apparent nausea or vomiting Anesthetic complications: no   No complications documented.  Last Vitals:  Vitals:   02/25/20 1640 02/25/20 1747  BP: 133/87 125/85  Pulse: (!) 46 64  Resp: 14 14  Temp: 36.5 C 36.4 C  SpO2: 100% 100%    Last Pain:  Vitals:   02/25/20 1747  TempSrc:   PainSc: 1                  Catalina Gravel

## 2020-02-25 NOTE — Anesthesia Procedure Notes (Signed)
Procedure Name: LMA Insertion Date/Time: 02/25/2020 2:15 PM Performed by: Claudia Desanctis, CRNA Pre-anesthesia Checklist: Emergency Drugs available, Patient identified, Suction available and Patient being monitored Patient Re-evaluated:Patient Re-evaluated prior to induction Oxygen Delivery Method: Circle system utilized Preoxygenation: Pre-oxygenation with 100% oxygen Induction Type: IV induction Ventilation: Mask ventilation without difficulty LMA: LMA inserted LMA Size: 4.0 Number of attempts: 1 Placement Confirmation: positive ETCO2 and breath sounds checked- equal and bilateral Tube secured with: Tape Dental Injury: Teeth and Oropharynx as per pre-operative assessment

## 2020-02-25 NOTE — Consult Note (Signed)
Urology Consult  Referring physician: Dr. Jonelle Sidle Reason for referral: right ureteral calculus, intractable pain  Chief Complaint: right flank pain  History of Present Illness: [name] is a 49yo with a hx of nephrolithiasis who presented to the ER last night with a 2 day hx of right flank pain with associated urinary urgency and frequency. She has had multiple stone events starting in 2012 and her last stone event October 2020. Currently right flank pain is 5/10 on narcotics. The pain is sharp, intermittent, moderate and nonraditing. She has associated nausea. She has urinary frequency and urgency but no dysuria or hematuria. No fevers. UA shows nitrites and rare bacteria. WBC count normal. CT from yesterday shows a 68m right distal ureteral calculus with associated hydronephrosis.  Past Medical History:  Diagnosis Date  . Anxiety   . Chest pain   . Esophageal spasm   . High cholesterol   . Hypertension   . Renal disorder    Past Surgical History:  Procedure Laterality Date  . BACK SURGERY    . CHOLECYSTECTOMY  2009  . ENDOMETRIOSIS SURGERY  1992  . HERNIA REPAIR  2009   WITH TUMMY TUCK  . KNEE ALC Left 2005  . KNEE ARTHROSCOPY W/ ACL RECONSTRUCTION Left 2017   LEFT KNEE ACL TEAR  . KNEE SURGERY Left 2004   SCAR TISSUE REMOVAL  . LEFT HEART CATH AND CORONARY ANGIOGRAPHY N/A 03/17/2017   Procedure: LEFT HEART CATH AND CORONARY ANGIOGRAPHY;  Surgeon: MBurnell Blanks MD;  Location: [facility];  Service: Cardiovascular;  Laterality: N/A;  . STENT FOR KIDNEY STONES  2014  . TOTAL ABDOMINAL HYSTERECTOMY  2017    Medications: I have reviewed the patient's current medications. Allergies:  Allergies  Allergen Reactions  . Nsaids Other (See Comments)    Ulcers  . Sulfa Antibiotics Other (See Comments)    Pt states "I bleed from my body orficese when I take sulfa drugs"    Family History  Problem Relation Age of Onset  . Hypertension Mother   . Hypertension Father    . Hypothyroidism Brother   . Rheum arthritis Daughter   . Asthma Daughter   . Hypothyroidism Brother   . Asthma Son    Social History:  reports that she has quit smoking. She has never used smokeless tobacco. She reports current alcohol use. She reports that she does not use drugs.  Review of Systems  Gastrointestinal: Positive for nausea.  Genitourinary: Positive for flank pain.  All other systems reviewed and are negative.   Physical Exam:  Vital signs in last 24 hours: Temp:  [98.1 F (36.7 C)-98.9 F (37.2 C)] 98.2 F (36.8 C) (07/17 0936) Pulse Rate:  [48-96] 62 (07/17 0936) Resp:  [8-21] 12 (07/17 0936) BP: (107-146)/(74-95) 107/74 (07/17 0936) SpO2:  [92 %-100 %] 100 % (07/17 0936) Weight:  [56.7 kg] 56.7 kg (07/16 1758) Physical Exam Constitutional:      Appearance: Normal appearance.  HENT:     Head: Normocephalic and atraumatic.  Eyes:     Extraocular Movements: Extraocular movements intact.     Pupils: Pupils are equal, round, and reactive to light.  Cardiovascular:     Rate and Rhythm: Normal rate and regular rhythm.  Pulmonary:     Effort: Pulmonary effort is normal. No respiratory distress.  Abdominal:     General: Abdomen is flat. There is no distension.  Musculoskeletal:        General: No swelling. Normal range of motion.  Cervical back: Normal range of motion and neck supple.  Skin:    General: Skin is warm and dry.  Neurological:     General: No focal deficit present.     Mental Status: She is alert and oriented to person, place, and time.  Psychiatric:        Mood and Affect: Mood normal.        Behavior: Behavior normal.        Thought Content: Thought content normal.        Judgment: Judgment normal.     Laboratory Data:  Results for orders placed or performed during the hospital encounter of 02/24/20 (from the past 72 hour(s))  Urinalysis, Routine w reflex microscopic- may I&O cath if menses     Status: Abnormal   Collection Time:  02/24/20  9:41 PM  Result Value Ref Range   Color, Urine AMBER (A) YELLOW    Comment: BIOCHEMICALS MAY BE AFFECTED BY COLOR   APPearance HAZY (A) CLEAR   Specific Gravity, Urine 1.020 1.005 - 1.030   pH 5.0 5.0 - 8.0   Glucose, UA NEGATIVE NEGATIVE mg/dL   Hgb urine dipstick LARGE (A) NEGATIVE   Bilirubin Urine NEGATIVE NEGATIVE   Ketones, ur NEGATIVE NEGATIVE mg/dL   Protein, ur 30 (A) NEGATIVE mg/dL   Nitrite POSITIVE (A) NEGATIVE   Leukocytes,Ua NEGATIVE NEGATIVE   RBC / HPF >50 (H) 0 - 5 RBC/hpf   WBC, UA 11-20 0 - 5 WBC/hpf   Bacteria, UA RARE (A) NONE SEEN   Squamous Epithelial / LPF 6-10 0 - 5   Mucus PRESENT    Ca Oxalate Crys, UA PRESENT     Comment: Performed at [facility], [place] [address] [place], [place] [address]  CBC with Differential     Status: None   Collection Time: 02/24/20  9:41 PM  Result Value Ref Range   WBC 9.3 4.0 - 10.5 K/uL   RBC 4.41 3.87 - 5.11 MIL/uL   Hemoglobin 12.5 12.0 - 15.0 g/dL   HCT 38.7 36 - 46 %   MCV 87.8 80.0 - 100.0 fL   MCH 28.3 26.0 - 34.0 pg   MCHC 32.3 30.0 - 36.0 g/dL   RDW 12.0 11.5 - 15.5 %   Platelets 332 150 - 400 K/uL   nRBC 0.0 0.0 - 0.2 %   Neutrophils Relative % 72 %   Neutro Abs 6.7 1.7 - 7.7 K/uL   Lymphocytes Relative 22 %   Lymphs Abs 2.1 0.7 - 4.0 K/uL   Monocytes Relative 5 %   Monocytes Absolute 0.5 0 - 1 K/uL   Eosinophils Relative 0 %   Eosinophils Absolute 0.0 0 - 0 K/uL   Basophils Relative 1 %   Basophils Absolute 0.1 0 - 0 K/uL   Immature Granulocytes 0 %   Abs Immature Granulocytes 0.01 0.00 - 0.07 K/uL    Comment: Performed at [facility], [place] [address], [place], [place] [address]  Comprehensive metabolic panel     Status: Abnormal   Collection Time: 02/24/20  9:41 PM  Result Value Ref Range   Sodium 142 135 - 145 mmol/L   Potassium 3.6 3.5 - 5.1 mmol/L   Chloride 108 98 - 111 mmol/L   CO2 25 22 - 32 mmol/L   Glucose, Bld 102 (H) 70 - 99 mg/dL     Comment: Glucose reference range applies only to samples taken after fasting for at least 8 hours.  BUN 21 (H) 6 - 20 mg/dL   Creatinine, Ser 1.06 (H) 0.44 - 1.00 mg/dL   Calcium 9.9 8.9 - 10.3 mg/dL   Total Protein 8.2 (H) 6.5 - 8.1 g/dL   Albumin 4.8 3.5 - 5.0 g/dL   AST 23 15 - 41 U/L   ALT 12 0 - 44 U/L   Alkaline Phosphatase 51 38 - 126 U/L   Total Bilirubin 0.6 0.3 - 1.2 mg/dL   GFR calc non Af Amer >60 >60 mL/min   GFR calc Af Amer >60 >60 mL/min   Anion gap 9 5 - 15    Comment: Performed at [facility], [place] [address], [place], [place] [address]  Lactic acid, plasma     Status: None   Collection Time: 02/24/20  9:41 PM  Result Value Ref Range   Lactic Acid, Venous 1.1 0.5 - 1.9 mmol/L    Comment: Performed at [facility], [place] [address], [place], [place] [address]  I-Stat Beta hCG blood, ED ([facility], [facility], [facility] only)     Status: None   Collection Time: 02/24/20  9:53 PM  Result Value Ref Range   I-stat hCG, quantitative <5.0 <5 mIU/mL   Comment 3            Comment:   GEST. AGE      CONC.  (mIU/mL)   <=1 WEEK        5 - 50     2 WEEKS       50 - 500     3 WEEKS       100 - 10,000     4 WEEKS     1,000 - 30,000        FEMALE AND NON-PREGNANT FEMALE:     LESS THAN 5 mIU/mL   CBC     Status: Abnormal   Collection Time: 02/25/20  2:36 AM  Result Value Ref Range   WBC 7.8 4.0 - 10.5 K/uL   RBC 4.18 3.87 - 5.11 MIL/uL   Hemoglobin 11.8 (L) 12.0 - 15.0 g/dL   HCT 37.4 36 - 46 %   MCV 89.5 80.0 - 100.0 fL   MCH 28.2 26.0 - 34.0 pg   MCHC 31.6 30.0 - 36.0 g/dL   RDW 12.3 11.5 - 15.5 %   Platelets 296 150 - 400 K/uL   nRBC 0.0 0.0 - 0.2 %    Comment: Performed at [facility], [place] [address], [place], [place] [address]  SARS Coronavirus 2 by RT PCR (hospital order, performed in [facility] hospital lab) Nasopharyngeal Nasopharyngeal Swab     Status: None   Collection Time: 02/25/20  4:50 AM   Specimen: Nasopharyngeal Swab   Result Value Ref Range   SARS Coronavirus 2 NEGATIVE NEGATIVE    Comment: (NOTE) SARS-CoV-2 target nucleic acids are NOT DETECTED.  The SARS-CoV-2 RNA is generally detectable in upper and lower respiratory specimens during the acute phase of infection. The lowest concentration of SARS-CoV-2 viral copies this assay can detect is 250 copies / mL. A negative result does not preclude SARS-CoV-2 infection and should not be used as the sole basis for treatment or other patient management decisions.  A negative result may occur with improper specimen collection / handling, submission of specimen other than nasopharyngeal swab, presence of viral mutation(s) within the areas targeted by this assay, and inadequate number of viral copies (<250 copies / mL). A negative result must be combined with  clinical observations, patient history, and epidemiological information.  Fact Sheet for Patients:   StrictlyIdeas.no  Fact Sheet for Healthcare Providers: BankingDealers.co.za  This test is not yet approved or  cleared by the [place] FDA and has been authorized for detection and/or diagnosis of SARS-CoV-2 by FDA under an Emergency Use Authorization (EUA).  This EUA will remain in effect (meaning this test can be used) for the duration of the COVID-19 declaration under Section 564(b)(1) of the Act, 21 U.S.C. section 360bbb-3(b)(1), unless the authorization is terminated or revoked sooner.  Performed at [facility], [place] [address] [place], [place] [address]   Comprehensive metabolic panel     Status: Abnormal   Collection Time: 02/25/20  7:04 AM  Result Value Ref Range   Sodium 144 135 - 145 mmol/L   Potassium 3.0 (L) 3.5 - 5.1 mmol/L   Chloride 111 98 - 111 mmol/L   CO2 24 22 - 32 mmol/L   Glucose, Bld 130 (H) 70 - 99 mg/dL    Comment: Glucose reference range applies only to samples taken after fasting for at least 8  hours.   BUN 18 6 - 20 mg/dL   Creatinine, Ser 1.13 (H) 0.44 - 1.00 mg/dL   Calcium 9.0 8.9 - 10.3 mg/dL   Total Protein 6.9 6.5 - 8.1 g/dL   Albumin 4.1 3.5 - 5.0 g/dL   AST 18 15 - 41 U/L   ALT 12 0 - 44 U/L   Alkaline Phosphatase 47 38 - 126 U/L   Total Bilirubin 0.5 0.3 - 1.2 mg/dL   GFR calc non Af Amer 58 (L) >60 mL/min   GFR calc Af Amer >60 >60 mL/min   Anion gap 9 5 - 15    Comment: Performed at [facility], [place] [address] [place], [place] [address]   Recent Results (from the past 240 hour(s))  SARS Coronavirus 2 by RT PCR (hospital order, performed in [facility] hospital lab) Nasopharyngeal Nasopharyngeal Swab     Status: None   Collection Time: 02/25/20  4:50 AM   Specimen: Nasopharyngeal Swab  Result Value Ref Range Status   SARS Coronavirus 2 NEGATIVE NEGATIVE Final    Comment: (NOTE) SARS-CoV-2 target nucleic acids are NOT DETECTED.  The SARS-CoV-2 RNA is generally detectable in upper and lower respiratory specimens during the acute phase of infection. The lowest concentration of SARS-CoV-2 viral copies this assay can detect is 250 copies / mL. A negative result does not preclude SARS-CoV-2 infection and should not be used as the sole basis for treatment or other patient management decisions.  A negative result may occur with improper specimen collection / handling, submission of specimen other than nasopharyngeal swab, presence of viral mutation(s) within the areas targeted by this assay, and inadequate number of viral copies (<250 copies / mL). A negative result must be combined with clinical observations, patient history, and epidemiological information.  Fact Sheet for Patients:   StrictlyIdeas.no  Fact Sheet for Healthcare Providers: BankingDealers.co.za  This test is not yet approved or  cleared by the [place] FDA and has been authorized for detection and/or diagnosis of SARS-CoV-2  by FDA under an Emergency Use Authorization (EUA).  This EUA will remain in effect (meaning this test can be used) for the duration of the COVID-19 declaration under Section 564(b)(1) of the Act, 21 U.S.C. section 360bbb-3(b)(1), unless the authorization is terminated or revoked sooner.  Performed at [facility], [place] [address] [place],  [place] [address]    Creatinine: Recent Labs    02/24/20 2141 02/25/20 0704  CREATININE 1.06* 1.13*   Baseline Creatinine: 1  Impression/Assessment:  49yo with a right ureteral calculus  Plan:  -We discussed the management of kidney stones. These options include observation, ureteroscopy, shockwave lithotripsy (ESWL) and percutaneous nephrolithotomy (PCNL). We discussed which options are relevant to the patient's stone(s). We discussed the natural history of kidney stones as well as the complications of untreated stones and the impact on quality of life without treatment as well as with each of the above listed treatments. We also discussed the efficacy of each treatment in its ability to clear the stone burden. With any of these management options I discussed the signs and symptoms of infection and the need for emergent treatment should these be experienced. For each option we discussed the ability of each procedure to clear the patient of their stone burden.   For observation I described the risks which include but are not limited to silent renal damage, life-threatening infection, need for emergent surgery, failure to pass stone and pain.   For ureteroscopy I described the risks which include bleeding, infection, damage to contiguous structures, positioning injury, ureteral stricture, ureteral avulsion, ureteral injury, need for prolonged ureteral stent, inability to perform ureteroscopy, need for an interval procedure, inability to clear stone burden, stent discomfort/pain, heart attack, stroke, pulmonary embolus and the inherent  risks with general anesthesia.   For shockwave lithotripsy I described the risks which include arrhythmia, kidney contusion, kidney hemorrhage, need for transfusion, pain, inability to adequately break up stone, inability to pass stone fragments, Steinstrasse, infection associated with obstructing stones, need for alternate surgical procedure, need for repeat shockwave lithotripsy, MI, CVA, PE and the inherent risks with anesthesia/conscious sedation.   For PCNL I described the risks including positioning injury, pneumothorax, hydrothorax, need for chest tube, inability to clear stone burden, renal laceration, arterial venous fistula or malformation, need for embolization of kidney, loss of kidney or renal function, need for repeat procedure, need for prolonged nephrostomy tube, ureteral avulsion, MI, CVA, PE and the inherent risks of general anesthesia.   - The patient would like to proceed with right ureteroscopic stone extraction and ureteral stent placement  Anna Wilson 02/25/2020, 9:53 AM

## 2020-02-25 NOTE — Op Note (Signed)
Preoperative diagnosis: Right ureteral stone  Postoperative diagnosis: Same  Procedure: 1. cystoscopy 2.  right retrograde pyelography 3.  Intraoperative fluoroscopy, under one hour, with interpretation 4.  Right ureteroscopic stone manipulation with basket extraction 5.  Right 6 x 26 JJ stent placement  Attending: Rosie Fate  Anesthesia: General  Estimated blood loss: None  Drains: Right 6 x 24 JJ ureteral stent with tether  Specimens: stone for analysis  Antibiotics: rocpehin  Findings: mild right hydronephrosis. 77m distal ureteral calculus.  Indications: Patient is a 49year old female with a history of ureteral stone and who has failed medical expulsive therapy.  After discussing treatment options, she decided proceed with right ureteroscopic stone manipulation.  Procedure her in detail: The patient was brought to the operating room and a brief timeout was done to ensure correct patient, correct procedure, correct site.  General anesthesia was administered patient was placed in dorsal lithotomy position.  Her genitalia was then prepped and draped in usual sterile fashion.  A rigid 223French cystoscope was passed in the urethra and the bladder.  Bladder was inspected free masses or lesions.  the right ureteral orifices were in the normal orthotopic locations.  a 6 french ureteral catheter was then instilled into the right ureter orifice.  a gentle retrograde was obtained and findings noted above.  we then placed a zip wire through the ureteral catheter and advanced up to the renal pelvis.  we then removed the cystoscope and cannulated the right ureteral orifice with a semirigid ureteroscope.  we then encountered the stone in the distal ureter which was removed with an NPatent examiner We then performed ureteroscopy to the UPJ and noted no other calculi.   We then placed a 6 x 24 double-j ureteral stent over the original zip wire. We removed the wire and good coil was noted in the the  renal pelvis under fluoroscopy and the bladder under direct vision.    the bladder was then drained and this concluded the procedure which was well tolerated by patient.  Complications: None  Condition: Stable, extubated, transferred to PACU  Plan: Patient is to be discharged home as to follow-up in one week. She can remove her stent in 72 hours by pulling the tether

## 2020-02-25 NOTE — Transfer of Care (Signed)
Immediate Anesthesia Transfer of Care Note  Patient: Alvie Sox  Procedure(s) Performed: CYSTOSCOPY RETROGRADE/ URETEROSCOPY/STONE EXTRACTION WITH BASKET/URETERAL STENT PLACEMENT  (Right Ureter)  Patient Location: PACU  Anesthesia Type:General  Level of Consciousness: awake and patient cooperative  Airway & Oxygen Therapy: Patient Spontanous Breathing and Patient connected to face mask  Post-op Assessment: Report given to RN and Post -op Vital signs reviewed and stable  Post vital signs: Reviewed and stable  Last Vitals:  Vitals Value Taken Time  BP    Temp    Pulse 59 02/25/20 1446  Resp 7 02/25/20 1446  SpO2 100 % 02/25/20 1446  Vitals shown include unvalidated device data.  Last Pain:  Vitals:   02/25/20 1029  TempSrc:   PainSc: 3       Patients Stated Pain Goal: 2 (64/40/34 7425)  Complications: No complications documented.

## 2020-02-25 NOTE — Progress Notes (Signed)
Pt down to pacu in stable condition with no needs. No changes in pt condition.

## 2020-02-25 NOTE — Anesthesia Preprocedure Evaluation (Addendum)
Anesthesia Evaluation  Patient identified by MRN, date of birth, ID band Patient awake    Reviewed: Allergy & Precautions, NPO status , Patient's Chart, lab work & pertinent test results  History of Anesthesia Complications (+) PONV and history of anesthetic complications  Airway Mallampati: II  TM Distance: >3 FB Neck ROM: Full    Dental  (+) Teeth Intact, Dental Advisory Given   Pulmonary former smoker,    Pulmonary exam normal breath sounds clear to auscultation       Cardiovascular hypertension, Pt. on medications Normal cardiovascular exam Rhythm:Regular Rate:Normal     Neuro/Psych PSYCHIATRIC DISORDERS Anxiety negative neurological ROS     GI/Hepatic Neg liver ROS, GERD  Medicated,  Endo/Other  negative endocrine ROS  Renal/GU  right ureteral obstruction     Musculoskeletal negative musculoskeletal ROS (+)   Abdominal   Peds  (+) ATTENTION DEFICIT DISORDER WITHOUT HYPERACTIVITY Hematology  (+) Blood dyscrasia, anemia ,   Anesthesia Other Findings Day of surgery medications reviewed with the patient.  Reproductive/Obstetrics                            Anesthesia Physical Anesthesia Plan  ASA: II  Anesthesia Plan: General   Post-op Pain Management:    Induction:   PONV Risk Score and Plan: 4 or greater and Midazolam, Dexamethasone, Ondansetron, Scopolamine patch - Pre-op and Diphenhydramine  Airway Management Planned: LMA  Additional Equipment:   Intra-op Plan:   Post-operative Plan: Extubation in OR  Informed Consent: I have reviewed the patients History and Physical, chart, labs and discussed the procedure including the risks, benefits and alternatives for the proposed anesthesia with the patient or authorized representative who has indicated his/her understanding and acceptance.     Dental advisory given  Plan Discussed with: CRNA, Anesthesiologist and  Surgeon  Anesthesia Plan Comments:       Anesthesia Quick Evaluation

## 2020-02-25 NOTE — Plan of Care (Signed)
  Problem: Clinical Measurements: Goal: Respiratory complications will improve Outcome: Progressing   Problem: Clinical Measurements: Goal: Cardiovascular complication will be avoided Outcome: Progressing   Problem: Activity: Goal: Risk for activity intolerance will decrease Outcome: Progressing   Problem: Elimination: Goal: Will not experience complications related to bowel motility Outcome: Progressing   Problem: Safety: Goal: Ability to remain free from injury will improve Outcome: Progressing

## 2020-02-26 ENCOUNTER — Encounter (HOSPITAL_COMMUNITY): Payer: Self-pay | Admitting: Urology

## 2020-02-26 LAB — CBC WITH DIFFERENTIAL/PLATELET
Abs Immature Granulocytes: 0.01 10*3/uL (ref 0.00–0.07)
Basophils Absolute: 0 10*3/uL (ref 0.0–0.1)
Basophils Relative: 0 %
Eosinophils Absolute: 0 10*3/uL (ref 0.0–0.5)
Eosinophils Relative: 0 %
HCT: 33.8 % — ABNORMAL LOW (ref 36.0–46.0)
Hemoglobin: 10.8 g/dL — ABNORMAL LOW (ref 12.0–15.0)
Immature Granulocytes: 0 %
Lymphocytes Relative: 22 %
Lymphs Abs: 1.4 10*3/uL (ref 0.7–4.0)
MCH: 28.4 pg (ref 26.0–34.0)
MCHC: 32 g/dL (ref 30.0–36.0)
MCV: 88.9 fL (ref 80.0–100.0)
Monocytes Absolute: 0.5 10*3/uL (ref 0.1–1.0)
Monocytes Relative: 8 %
Neutro Abs: 4.5 10*3/uL (ref 1.7–7.7)
Neutrophils Relative %: 70 %
Platelets: 236 10*3/uL (ref 150–400)
RBC: 3.8 MIL/uL — ABNORMAL LOW (ref 3.87–5.11)
RDW: 12.2 % (ref 11.5–15.5)
WBC: 6.5 10*3/uL (ref 4.0–10.5)
nRBC: 0 % (ref 0.0–0.2)

## 2020-02-26 LAB — URINE CULTURE: Culture: <10000 — AB

## 2020-02-26 LAB — COMPREHENSIVE METABOLIC PANEL
ALT: 11 U/L (ref 0–44)
AST: 17 U/L (ref 15–41)
Albumin: 3.6 g/dL (ref 3.5–5.0)
Alkaline Phosphatase: 41 U/L (ref 38–126)
Anion gap: 8 (ref 5–15)
BUN: 14 mg/dL (ref 6–20)
CO2: 21 mmol/L — ABNORMAL LOW (ref 22–32)
Calcium: 9 mg/dL (ref 8.9–10.3)
Chloride: 113 mmol/L — ABNORMAL HIGH (ref 98–111)
Creatinine, Ser: 0.88 mg/dL (ref 0.44–1.00)
GFR calc Af Amer: >60 mL/min (ref >60)
GFR calc non Af Amer: >60 mL/min (ref >60)
Glucose, Bld: 141 mg/dL — ABNORMAL HIGH (ref 70–99)
Potassium: 3.8 mmol/L (ref 3.5–5.1)
Sodium: 142 mmol/L (ref 135–145)
Total Bilirubin: 0.2 mg/dL — ABNORMAL LOW (ref 0.3–1.2)
Total Protein: 6.4 g/dL — ABNORMAL LOW (ref 6.5–8.1)

## 2020-02-26 LAB — HEMOGLOBIN A1C
Hgb A1c MFr Bld: 5 % (ref 4.8–5.6)
Mean Plasma Glucose: 96.8 mg/dL

## 2020-02-26 LAB — PHOSPHORUS: Phosphorus: 2.7 mg/dL (ref 2.5–4.6)

## 2020-02-26 LAB — MAGNESIUM: Magnesium: 1.9 mg/dL (ref 1.7–2.4)

## 2020-02-26 MED ORDER — LACTATED RINGERS IV SOLN: INTRAVENOUS | Status: DC

## 2020-02-26 NOTE — Discharge Instructions (Signed)
Ureteral Stent Implantation, Care After This sheet gives you information about how to care for yourself after your procedure. Your health care provider may also give you more specific instructions. If you have problems or questions, contact your health care provider. What can I expect after the procedure? After the procedure, it is common to have:  Nausea.  Mild pain when you urinate. You may feel this pain in your lower back or lower abdomen. The pain should stop within a few minutes after you urinate. This may last for up to 1 week.  A small amount of blood in your urine for several days. Follow these instructions at home: Medicines  Take over-the-counter and prescription medicines only as told by your health care provider.  If you were prescribed an antibiotic medicine, take it as told by your health care provider. Do not stop taking the antibiotic even if you start to feel better.  Do not drive for 24 hours if you were given a sedative during your procedure.  Ask your health care provider if the medicine prescribed to you requires you to avoid driving or using heavy machinery. Activity  Rest as told by your health care provider.  Avoid sitting for a long time without moving. Get up to take short walks every 1-2 hours. This is important to improve blood flow and breathing. Ask for help if you feel weak or unsteady.  Return to your normal activities as told by your health care provider. Ask your health care provider what activities are safe for you. General instructions   Watch for any blood in your urine. Call your health care provider if the amount of blood in your urine increases.  If you have a catheter: ? Follow instructions from your health care provider about taking care of your catheter and collection bag. ? Do not take baths, swim, or use a hot tub until your health care provider approves. Ask your health care provider if you may take showers. You may only be allowed to  take sponge baths.  Drink enough fluid to keep your urine pale yellow.  Do not use any products that contain nicotine or tobacco, such as cigarettes, e-cigarettes, and chewing tobacco. These can delay healing after surgery. If you need help quitting, ask your health care provider.  Keep all follow-up visits as told by your health care provider. This is important. Contact a health care provider if:  You have pain that gets worse or does not get better with medicine, especially pain when you urinate.  You have difficulty urinating.  You feel nauseous or you vomit repeatedly during a period of more than 2 days after the procedure. Get help right away if:  Your urine is dark red or has blood clots in it.  You are leaking urine (have incontinence).  The end of the stent comes out of your urethra.  You cannot urinate.  You have sudden, sharp, or severe pain in your abdomen or lower back.  You have a fever.  You have swelling or pain in your legs.  You have difficulty breathing. Summary  After the procedure, it is common to have mild pain when you urinate that goes away within a few minutes after you urinate. This may last for up to 1 week.  Watch for any blood in your urine. Call your health care provider if the amount of blood in your urine increases.  Take over-the-counter and prescription medicines only as told by your health care provider.  Drink  enough fluid to keep your urine pale yellow. This information is not intended to replace advice given to you by your health care provider. Make sure you discuss any questions you have with your health care provider. Document Revised: 05/04/2018 Document Reviewed: 05/05/2018 Elsevier Patient Education  2020 [employer] BY GENTLY PULLING THE STRING

## 2020-02-26 NOTE — Discharge Summary (Signed)
Physician Discharge Summary  Anna Wilson ZOX:[id number] DOB: 03/19/1971 DOA: 02/24/2020  PCP: System, Pcp Not In  Admit date: 02/24/2020 Discharge date: 02/26/2020  Time spent: 40 minutes  Recommendations for Outpatient Follow-up:  1. Follow outpatient CBC/CMP 2. Follow with urology outpatient 3. Pt on cymbalta/lexapro - follow outpatient with PCP/prescriber   Discharge Diagnoses:  Principal Problem:   Nephrolithiasis Active Problems:   Benign essential HTN   GERD (gastroesophageal reflux disease)   Hyperlipidemia   Pyelonephritis   Discharge Condition: stable  Diet recommendation: heart healthy  Filed Weights   02/24/20 1758  Weight: 56.7 kg    History of present illness:  Anna Wilson Anna Wilson 49 y.o.femalewith medical history significant ofGERD, hypertension, hyperlipidemia, anxiety disorder who presented with right flank pain dysuria and vomiting since early this morning. She has had previous history of kidney stones. Patient came in also complained of some fever some mild chills. Evaluated in the ER with evidence of right CVA tenderness urine consistent with Pilo and imaging studies confirmed Anna Wilson 3 mm UPG stone with some hydronephrosis. Patient is therefore being admitted with infected kidney stones and acute pyelonephritis..  ED Course:Temperature 98.9 blood pressure 134/95 pulse 96 respiratory rate of 16 oxygen sats 96% room air white count is 9.3 hemoglobin 12.5. BUN 21 creatinine 1.06 and glucose 102. Urinalysis showed large hemoglobin positive nitrite WBC 11-20 with rare bacteria. CT renal stone showed Lynden Flemmer 3 mm UPJ stone. Patient being admitted to the hospital with sepsis with infected renal stones  She was admitted for an obstructing stone with hydronephrosis.  She was seen by urology who performed R ureteroscopic stone manipulation with basket extraction and R 6x26 JJ stent placement.  Urine culture was negative.  She was doing well on 7/18 and discharged home with  plans for outpatient f/u per urology.  Hospital Course:  #1 Pyelonephritis  Hydronephrosis with obstructing 3 mm calculus:  Continue abx - ok to d/c abx at discharge per urology Follow urine culture - insignificant growth , UA with squams, nitrite positive, >50 RBC's, 11-20 WBC's Urology c/s, appreciate recommendations  Now s/p R retrograde pyelography, intraoperative fluroscopy, R ureteroscopic stone manipulation with basket extraction, R 6x26 JJ stent placement Follow up with urology outpatient as scheduled  #3 benign essential hypertension:Continue home regimen of blood pressure control  #4 GERD:Continue PPI  #5 hyperlipidemia:Continue statin  # HTN: hold home amlodipine/valstartan  # ADHD: hold hoem adderall   # Depression  Anxiety: on both cymbalta and lexapro, follow outpatient   # Ankylosing Spondylosis: on humira q14 days  # S/p recent lumbar fusion   # Hypokalemia: replace and follow  Procedures: 7/17 1. cystoscopy 2.  right retrograde pyelography 3.  Intraoperative fluoroscopy, under one hour, with interpretation 4.  Right ureteroscopic stone manipulation with basket extraction 5.  Right 6 x 26 JJ stent placement  Consultations:  urology  Discharge Exam: Vitals:   02/26/20 0618 02/26/20 1010  BP: 124/81 124/80  Pulse: (!) 41 (!) 58  Resp: 16 14  Temp: 97.6 F (36.4 C)   SpO2: 100% 100%   Feeling well, ready to go home Daughter at bedside  General: No acute distress. Cardiovascular: Heart sounds show Elon Lomeli regular rate, and rhythm Lungs: Clear to auscultation bilaterally  Abdomen: Soft, nontender, nondistended  Neurological: Alert and oriented 3. Moves all extremities 4 . Cranial nerves II through XII grossly intact. Skin: Warm and dry. No rashes or lesions. Extremities: No clubbing or cyanosis. No edema.  Discharge Instructions   Discharge Instructions  Call MD for:  difficulty breathing, headache or visual disturbances    Complete by: As directed    Call MD for:  extreme fatigue   Complete by: As directed    Call MD for:  hives   Complete by: As directed    Call MD for:  persistant dizziness or light-headedness   Complete by: As directed    Call MD for:  persistant nausea and vomiting   Complete by: As directed    Call MD for:  redness, tenderness, or signs of infection (pain, swelling, redness, odor or green/yellow discharge around incision site)   Complete by: As directed    Call MD for:  severe uncontrolled pain   Complete by: As directed    Call MD for:  temperature >100.4   Complete by: As directed    Diet - low sodium heart healthy   Complete by: As directed    Discharge instructions   Complete by: As directed    You were admitted with [name] urinary tract infection in the setting of an obstructing stone.  You had [place] stent placed and the stone was extracted.    You've improved.  You will not need any additional antibiotics per urology.  Follow your anemia and blood sugars with your primary care doctor.  Please follow up with urology as an outpatient.  Return for new, recurrent, or worsening symptoms.  Please ask your PCP to request records from this hospitalization so they know what was done and what the next steps will be.   Discharge patient   Complete by: As directed    Discharge disposition: 01-Home or Self Care   Discharge patient date: 02/26/2020   Increase activity slowly   Complete by: As directed      Allergies as of 02/26/2020      Reactions   Nsaids Other (See Comments)   Ulcers   Other Other (See Comments)   Ulcers   Sulfa Antibiotics Other (See Comments)   Pt states "I bleed from my body orficese when I take sulfa drugs"   Sulfasalazine Other (See Comments)   Pt states "I bleed from my body orficese when I take sulfa drugs"      Medication List    TAKE these medications   Adalimumab 40 MG/0.8ML Pnkt Inject 40 mg into the skin every 14 (fourteen) days.    amLODipine-valsartan 10-320 MG tablet Commonly known as: EXFORGE Take 1 tablet by mouth daily.   amphetamine-dextroamphetamine 30 MG 24 hr capsule Commonly known as: ADDERALL XR Take 30 mg by mouth every morning.   DULoxetine 60 MG capsule Commonly known as: CYMBALTA Take 60 mg by mouth daily.   escitalopram 10 MG tablet Commonly known as: LEXAPRO Take 10 mg by mouth daily.   MULTIVITAMIN ADULT PO Take 1 tablet by mouth daily.   Omega-3 1000 MG Caps Take 1,000 mg by mouth daily.   oxyCODONE-acetaminophen 5-325 MG tablet Commonly known as: PERCOCET/ROXICET Take 1 tablet by mouth as needed for pain.   pantoprazole 40 MG tablet Commonly known as: PROTONIX Take 40 mg by mouth 2 (two) times daily.   Red Yeast Rice Extract 600 MG Caps Take 600 mg by mouth at bedtime.   SUMAtriptan 100 MG tablet Commonly known as: IMITREX Take 100 mg by mouth every 2 (two) hours as needed for migraine or headache.   topiramate 50 MG tablet Commonly known as: TOPAMAX Take 50 mg by mouth 2 (two) times daily.      Allergies  Allergen Reactions  . Nsaids Other (See Comments)    Ulcers  . Other Other (See Comments)    Ulcers  . Sulfa Antibiotics Other (See Comments)    Pt states "I bleed from my body orficese when I take sulfa drugs"  . Sulfasalazine Other (See Comments)    Pt states "I bleed from my body orficese when I take sulfa drugs"    Follow-up Information    Jed Limerick, NP. Call in 1 week(s).   Specialty: Urology Contact information: [address] [place] [address] [phone]                The results of significant diagnostics from this hospitalization (including imaging, microbiology, ancillary and laboratory) are listed below for reference.    Significant Diagnostic Studies: DG C-Arm 1-60 Min-No Report  Result Date: 02/25/2020 Fluoroscopy was utilized by the requesting physician.  No radiographic interpretation.   CT Renal Stone  Study  Result Date: 02/24/2020 CLINICAL DATA:  Right flank pain, nausea, vomiting, urinary urgency EXAM: CT ABDOMEN AND PELVIS WITHOUT CONTRAST TECHNIQUE: Multidetector CT imaging of the abdomen and pelvis was performed following the standard protocol without IV contrast. COMPARISON:  04/07/2019 FINDINGS: Lower chest: The visualized lung bases are clear bilaterally. The visualized heart and pericardium are unremarkable. Hepatobiliary: Cholecystectomy has been performed. The liver is unremarkable. No intra or extrahepatic biliary ductal dilation. Pancreas: Unremarkable Spleen: Unremarkable Adrenals/Urinary Tract: The adrenal glands are unremarkable. The kidneys are normal in size and position. One of the 2 previously identified nonobstructing calculi within the lower pole of the right kidney has now migrated into the distal right ureter 1-2 cm proximal to the left ureterovesicular junction and results in mild to moderate right hydronephrosis, new from prior examination. This calculus measures 3 mm in greatest dimension. Additional 3 mm nonobstructing calculi are noted within the lower pole of the kidneys bilaterally as well as the interpolar region of the left kidney. No ureteral calculi on the left. No hydronephrosis on the left. The bladder is decompressed. Stomach/Bowel: The large and small bowel are unremarkable. Appendix normal. No free intraperitoneal gas or fluid. Appendix normal. Vascular/Lymphatic: No pathologic adenopathy within the abdomen and pelvis. The abdominal vasculature is normal on this noncontrast examination. Reproductive: Uterus absent. No adnexal masses. Right tubal ligation clip or dropped surgical clip noted. Other: The rectum is unremarkable. Musculoskeletal: L4-5 anterior and posterior spinal fusion with instrumentation has been performed. Partial resection of the L5 spinous process and L4 spinous process is noted. No acute bone abnormality. IMPRESSION: Obstructing 3 mm calculus within  the distal right ureter just proximal to the ureterovesicular junction resulting in mild to moderate right hydronephrosis. Superimposed mild bilateral nonobstructing nephrolithiasis. Electronically Signed   By: Fidela Salisbury MD   On: 02/24/2020 21:53    Microbiology: Recent Results (from the past 240 hour(s))  Urine culture     Status: Abnormal   Collection Time: 02/24/20  9:41 PM   Specimen: Urine, Clean Catch  Result Value Ref Range Status   Specimen Description   Final    URINE, CLEAN CATCH Performed at [facility] [address] [place], [place] [address]    Special Requests   Final    NONE Performed at [facility], [place] [address], [place], [place] [address]    Culture (Pam Vanalstine)  Final    <10,000 COLONIES/mL INSIGNIFICANT GROWTH Performed at [facility] [address] [place], [place] [address]  Report Status 02/26/2020 FINAL  Final  SARS Coronavirus 2 by RT PCR (hospital order, performed in [facility] hospital lab) Nasopharyngeal Nasopharyngeal Swab     Status: None   Collection Time: 02/25/20  4:50 AM   Specimen: Nasopharyngeal Swab  Result Value Ref Range Status   SARS Coronavirus 2 NEGATIVE NEGATIVE Final    Comment: (NOTE) SARS-CoV-2 target nucleic acids are NOT DETECTED.  The SARS-CoV-2 RNA is generally detectable in upper and lower respiratory specimens during the acute phase of infection. The lowest concentration of SARS-CoV-2 viral copies this assay can detect is 250 copies / mL. Oluwatimilehin Balfour negative result does not preclude SARS-CoV-2 infection and should not be used as the sole basis for treatment or other patient management decisions.  Zafir Schauer negative result may occur with improper specimen collection / handling, submission of specimen other than nasopharyngeal swab, presence of viral mutation(s) within the areas targeted by this assay, and inadequate number of viral copies (<250 copies / mL). Crandall Harvel negative result must be combined  with clinical observations, patient history, and epidemiological information.  Fact Sheet for Patients:   StrictlyIdeas.no  Fact Sheet for Healthcare Providers: BankingDealers.co.za  This test is not yet approved or  cleared by the [place] FDA and has been authorized for detection and/or diagnosis of SARS-CoV-2 by FDA under an Emergency Use Authorization (EUA).  This EUA will remain in effect (meaning this test can be used) for the duration of the COVID-19 declaration under Section 564(b)(1) of the Act, 21 U.S.C. section 360bbb-3(b)(1), unless the authorization is terminated or revoked sooner.  Performed at [facility], [place] [address] [place], [place] [address]      Labs: Basic Metabolic Panel: Recent Labs  Lab 02/24/20 2141 02/25/20 0704 02/26/20 0358  NA 142 144 142  K 3.6 3.0* 3.8  CL 108 111 113*  CO2 25 24 21*  GLUCOSE 102* 130* 141*  BUN 21* 18 14  CREATININE 1.06* 1.13* 0.88  CALCIUM 9.9 9.0 9.0  MG  --   --  1.9  PHOS  --   --  2.7   Liver Function Tests: Recent Labs  Lab 02/24/20 2141 02/25/20 0704 02/26/20 0358  AST 23 18 17   ALT 12 12 11   ALKPHOS 51 47 41  BILITOT 0.6 0.5 0.2*  PROT 8.2* 6.9 6.4*  ALBUMIN 4.8 4.1 3.6   No results for input(s): LIPASE, AMYLASE in the last 168 hours. No results for input(s): AMMONIA in the last 168 hours. CBC: Recent Labs  Lab 02/24/20 2141 02/25/20 0236 02/26/20 0358  WBC 9.3 7.8 6.5  NEUTROABS 6.7  --  4.5  HGB 12.5 11.8* 10.8*  HCT 38.7 37.4 33.8*  MCV 87.8 89.5 88.9  PLT 332 296 236   Cardiac Enzymes: No results for input(s): CKTOTAL, CKMB, CKMBINDEX, TROPONINI in the last 168 hours. BNP: BNP (last 3 results) No results for input(s): BNP in the last 8760 hours.  ProBNP (last 3 results) No results for input(s): PROBNP in the last 8760 hours.  CBG: No results for input(s): GLUCAP in the last 168  hours.     Signed:  Fayrene Helper MD.  [facility] 02/26/2020, 6:51 PM

## 2020-02-26 NOTE — Plan of Care (Signed)
Pt to d/C home with family. No needs at this time.

## 2020-02-26 NOTE — Plan of Care (Signed)
  Problem: Clinical Measurements: Goal: Respiratory complications will improve Outcome: Progressing   Problem: Clinical Measurements: Goal: Cardiovascular complication will be avoided Outcome: Progressing   Problem: Activity: Goal: Risk for activity intolerance will decrease Outcome: Progressing   Problem: Nutrition: Goal: Adequate nutrition will be maintained Outcome: Progressing

## 2020-03-24 DIAGNOSIS — Z8616 Personal history of COVID-19: Secondary | ICD-10-CM

## 2020-03-24 HISTORY — DX: Personal history of COVID-19: Z86.16

## 2020-05-31 ENCOUNTER — Inpatient Hospital Stay (HOSPITAL_COMMUNITY): Payer: No Typology Code available for payment source | Admitting: Anesthesiology

## 2020-05-31 ENCOUNTER — Other Ambulatory Visit: Payer: Self-pay | Admitting: Urology

## 2020-05-31 ENCOUNTER — Inpatient Hospital Stay (HOSPITAL_COMMUNITY): Payer: No Typology Code available for payment source

## 2020-05-31 ENCOUNTER — Encounter (HOSPITAL_COMMUNITY): Payer: Self-pay | Admitting: Urology

## 2020-05-31 ENCOUNTER — Encounter (HOSPITAL_COMMUNITY)
Admission: RE | Disposition: A | Discharge status: Home / Self Care | Payer: Self-pay | Source: Ambulatory Visit | Attending: Urology

## 2020-05-31 ENCOUNTER — Ambulatory Visit (HOSPITAL_COMMUNITY)
Admission: RE | Admit: 2020-05-31 | Discharge: 2020-05-31 | Disposition: A | Discharge status: Home / Self Care | Payer: No Typology Code available for payment source | Source: Ambulatory Visit | Attending: Urology | Admitting: Urology

## 2020-05-31 ENCOUNTER — Other Ambulatory Visit: Payer: Self-pay

## 2020-05-31 DIAGNOSIS — N202 Calculus of kidney with calculus of ureter: Secondary | ICD-10-CM | POA: Insufficient documentation

## 2020-05-31 DIAGNOSIS — Z8616 Personal history of COVID-19: Secondary | ICD-10-CM | POA: Diagnosis not present

## 2020-05-31 DIAGNOSIS — I10 Essential (primary) hypertension: Secondary | ICD-10-CM | POA: Insufficient documentation

## 2020-05-31 DIAGNOSIS — F909 Attention-deficit hyperactivity disorder, unspecified type: Secondary | ICD-10-CM | POA: Diagnosis present

## 2020-05-31 DIAGNOSIS — M459 Ankylosing spondylitis of unspecified sites in spine: Secondary | ICD-10-CM | POA: Diagnosis present

## 2020-05-31 DIAGNOSIS — Z87891 Personal history of nicotine dependence: Secondary | ICD-10-CM | POA: Diagnosis not present

## 2020-05-31 HISTORY — PX: CYSTOSCOPY W/ URETERAL STENT PLACEMENT: SHX1429

## 2020-05-31 LAB — CBC WITH DIFFERENTIAL/PLATELET
Abs Immature Granulocytes: 0.02 10*3/uL (ref 0.00–0.07)
Basophils Absolute: 0.1 10*3/uL (ref 0.0–0.1)
Basophils Relative: 1 %
Eosinophils Absolute: 0.2 10*3/uL (ref 0.0–0.5)
Eosinophils Relative: 3 %
HCT: 39.9 % (ref 36.0–46.0)
Hemoglobin: 12.8 g/dL (ref 12.0–15.0)
Immature Granulocytes: 0 %
Lymphocytes Relative: 41 %
Lymphs Abs: 2.4 10*3/uL (ref 0.7–4.0)
MCH: 28.2 pg (ref 26.0–34.0)
MCHC: 32.1 g/dL (ref 30.0–36.0)
MCV: 87.9 fL (ref 80.0–100.0)
Monocytes Absolute: 0.3 10*3/uL (ref 0.1–1.0)
Monocytes Relative: 6 %
Neutro Abs: 2.9 10*3/uL (ref 1.7–7.7)
Neutrophils Relative %: 49 %
Platelets: 321 10*3/uL (ref 150–400)
RBC: 4.54 MIL/uL (ref 3.87–5.11)
RDW: 13.3 % (ref 11.5–15.5)
WBC: 5.9 10*3/uL (ref 4.0–10.5)
nRBC: 0 % (ref 0.0–0.2)

## 2020-05-31 LAB — COMPREHENSIVE METABOLIC PANEL
ALT: 19 U/L (ref 0–44)
AST: 39 U/L (ref 15–41)
Albumin: 4.8 g/dL (ref 3.5–5.0)
Alkaline Phosphatase: 55 U/L (ref 38–126)
Anion gap: 11 (ref 5–15)
BUN: 12 mg/dL (ref 6–20)
CO2: 21 mmol/L — ABNORMAL LOW (ref 22–32)
Calcium: 9.4 mg/dL (ref 8.9–10.3)
Chloride: 106 mmol/L (ref 98–111)
Creatinine, Ser: 1.02 mg/dL — ABNORMAL HIGH (ref 0.44–1.00)
GFR, Estimated: >60 mL/min (ref >60)
Glucose, Bld: 86 mg/dL (ref 70–99)
Potassium: 3.5 mmol/L (ref 3.5–5.1)
Sodium: 138 mmol/L (ref 135–145)
Total Bilirubin: 0.9 mg/dL (ref 0.3–1.2)
Total Protein: 8.4 g/dL — ABNORMAL HIGH (ref 6.5–8.1)

## 2020-05-31 LAB — RESPIRATORY PANEL BY RT PCR (FLU A&B, COVID)
Influenza A by PCR: NEGATIVE
Influenza B by PCR: NEGATIVE
SARS Coronavirus 2 by RT PCR: NEGATIVE

## 2020-05-31 SURGERY — CYSTOSCOPY, WITH RETROGRADE PYELOGRAM AND URETERAL STENT INSERTION
Anesthesia: General | Laterality: Left

## 2020-05-31 MED ORDER — DIPHENHYDRAMINE HCL 50 MG/ML IJ SOLN
INTRAMUSCULAR | Status: AC
  Filled 2020-05-31: qty 1

## 2020-05-31 MED ORDER — FENTANYL CITRATE (PF) 100 MCG/2ML IJ SOLN: 25.0000 ug | INTRAMUSCULAR | Status: DC | PRN

## 2020-05-31 MED ORDER — LIDOCAINE 2% (20 MG/ML) 5 ML SYRINGE
INTRAMUSCULAR | Status: AC
  Filled 2020-05-31: qty 5

## 2020-05-31 MED ORDER — TAMSULOSIN HCL 0.4 MG PO CAPS: 0.4000 mg | ORAL_CAPSULE | Freq: Every day | ORAL | 1 refills | Status: DC

## 2020-05-31 MED ORDER — CEPHALEXIN 500 MG PO CAPS: 500.0000 mg | ORAL_CAPSULE | Freq: Two times a day (BID) | ORAL | 0 refills | Status: AC

## 2020-05-31 MED ORDER — FENTANYL CITRATE (PF) 100 MCG/2ML IJ SOLN
INTRAMUSCULAR | Status: DC | PRN
  Administered 2020-05-31: 50 ug via INTRAVENOUS
  Administered 2020-05-31: 25 ug via INTRAVENOUS

## 2020-05-31 MED ORDER — LACTATED RINGERS IV SOLN: INTRAVENOUS | Status: DC

## 2020-05-31 MED ORDER — ONDANSETRON HCL 4 MG/2ML IJ SOLN: 4.0000 mg | Freq: Once | INTRAMUSCULAR | Status: DC | PRN

## 2020-05-31 MED ORDER — MIDAZOLAM HCL 2 MG/2ML IJ SOLN
INTRAMUSCULAR | Status: AC
  Filled 2020-05-31: qty 2

## 2020-05-31 MED ORDER — ONDANSETRON HCL 4 MG/2ML IJ SOLN
INTRAMUSCULAR | Status: DC | PRN
  Administered 2020-05-31: 4 mg via INTRAVENOUS

## 2020-05-31 MED ORDER — DIPHENHYDRAMINE HCL 50 MG/ML IJ SOLN
INTRAMUSCULAR | Status: DC | PRN
  Administered 2020-05-31: 25 mg via INTRAVENOUS

## 2020-05-31 MED ORDER — ONDANSETRON HCL 4 MG/2ML IJ SOLN
INTRAMUSCULAR | Status: AC
  Filled 2020-05-31: qty 2

## 2020-05-31 MED ORDER — SODIUM CHLORIDE 0.9 % IR SOLN
Status: DC | PRN
  Administered 2020-05-31: 3000 mL via INTRAVESICAL

## 2020-05-31 MED ORDER — OXYCODONE HCL 5 MG PO TABS: 5.0000 mg | ORAL_TABLET | Freq: Once | ORAL | Status: DC | PRN

## 2020-05-31 MED ORDER — FENTANYL CITRATE (PF) 100 MCG/2ML IJ SOLN
INTRAMUSCULAR | Status: AC
  Filled 2020-05-31: qty 2

## 2020-05-31 MED ORDER — DIPHENHYDRAMINE HCL 50 MG/ML IJ SOLN
25.0000 mg | Freq: Once | INTRAMUSCULAR | Status: AC
  Administered 2020-05-31: 25 mg via INTRAVENOUS

## 2020-05-31 MED ORDER — OXYBUTYNIN CHLORIDE ER 10 MG PO TB24: 10.0000 mg | ORAL_TABLET | Freq: Every day | ORAL | 0 refills | Status: DC

## 2020-05-31 MED ORDER — SCOPOLAMINE 1 MG/3DAYS TD PT72
1.0000 | MEDICATED_PATCH | TRANSDERMAL | Status: DC
  Administered 2020-05-31: 1.5 mg via TRANSDERMAL
  Filled 2020-05-31: qty 1

## 2020-05-31 MED ORDER — MIDAZOLAM HCL 5 MG/5ML IJ SOLN
INTRAMUSCULAR | Status: DC | PRN
  Administered 2020-05-31: 2 mg via INTRAVENOUS

## 2020-05-31 MED ORDER — OXYCODONE HCL 5 MG/5ML PO SOLN: 5.0000 mg | Freq: Once | ORAL | Status: DC | PRN

## 2020-05-31 MED ORDER — DEXAMETHASONE SODIUM PHOSPHATE 10 MG/ML IJ SOLN
INTRAMUSCULAR | Status: DC | PRN
  Administered 2020-05-31: 10 mg via INTRAVENOUS

## 2020-05-31 MED ORDER — DEXAMETHASONE SODIUM PHOSPHATE 10 MG/ML IJ SOLN
INTRAMUSCULAR | Status: AC
  Filled 2020-05-31: qty 1

## 2020-05-31 MED ORDER — PROPOFOL 10 MG/ML IV BOLUS
INTRAVENOUS | Status: DC | PRN
  Administered 2020-05-31: 120 mg via INTRAVENOUS

## 2020-05-31 MED ORDER — ACETAMINOPHEN 325 MG PO TABS
ORAL_TABLET | ORAL | Status: DC | PRN
  Administered 2020-05-31: 1000 mg via ORAL

## 2020-05-31 MED ORDER — OXYCODONE-ACETAMINOPHEN 5-325 MG PO TABS
1.0000 | ORAL_TABLET | ORAL | 0 refills | Status: DC | PRN
Start: 2020-05-31 — End: 2020-06-01

## 2020-05-31 MED ORDER — DOCUSATE SODIUM 100 MG PO CAPS: 100.0000 mg | ORAL_CAPSULE | Freq: Every day | ORAL | 2 refills | Status: DC | PRN

## 2020-05-31 MED ORDER — ACETAMINOPHEN 500 MG PO TABS
ORAL_TABLET | ORAL | Status: AC
  Filled 2020-05-31: qty 2

## 2020-05-31 MED ORDER — LIDOCAINE 2% (20 MG/ML) 5 ML SYRINGE
INTRAMUSCULAR | Status: DC | PRN
  Administered 2020-05-31: 60 mg via INTRAVENOUS

## 2020-05-31 MED ORDER — CEFAZOLIN SODIUM-DEXTROSE 2-4 GM/100ML-% IV SOLN
2.0000 g | Freq: Once | INTRAVENOUS | Status: AC
  Administered 2020-05-31: 2 g via INTRAVENOUS

## 2020-05-31 MED ORDER — IOHEXOL 300 MG/ML  SOLN
INTRAMUSCULAR | Status: DC | PRN
  Administered 2020-05-31: 4 mL via URETHRAL

## 2020-05-31 MED ORDER — PROPOFOL 10 MG/ML IV BOLUS
INTRAVENOUS | Status: AC
  Filled 2020-05-31: qty 20

## 2020-05-31 SURGICAL SUPPLY — 14 items
BAG URO CATCHER STRL LF (MISCELLANEOUS) ×2 IMPLANT
CATH INTERMIT  6FR 70CM (CATHETERS) IMPLANT
CATH URET 5FR 28IN OPEN ENDED (CATHETERS) ×2 IMPLANT
CLOTH BEACON ORANGE TIMEOUT ST (SAFETY) ×2 IMPLANT
GLOVE BIOGEL M 7.0 STRL (GLOVE) ×6 IMPLANT
GOWN STRL REUS W/TWL LRG LVL3 (GOWN DISPOSABLE) ×4 IMPLANT
GUIDEWIRE STR DUAL SENSOR (WIRE) IMPLANT
GUIDEWIRE ZIPWRE .038 STRAIGHT (WIRE) ×2 IMPLANT
KIT TURNOVER KIT A (KITS) ×2 IMPLANT
MANIFOLD NEPTUNE II (INSTRUMENTS) ×2 IMPLANT
PACK CYSTO (CUSTOM PROCEDURE TRAY) ×2 IMPLANT
STENT URET 6FRX24 CONTOUR (STENTS) ×2 IMPLANT
TUBING CONNECTING 10 (TUBING) ×2 IMPLANT
TUBING UROLOGY SET (TUBING) IMPLANT

## 2020-05-31 NOTE — Transfer of Care (Signed)
Immediate Anesthesia Transfer of Care Note  Patient: Semaya Zwart  Procedure(s) Performed: CYSTOSCOPY WITH RETROGRADE PYELOGRAM/URETERAL STENT PLACEMENT (Left )  Patient Location: PACU  Anesthesia Type:General  Level of Consciousness: awake, drowsy, patient cooperative and responds to stimulation  Airway & Oxygen Therapy: Patient Spontanous Breathing and Patient connected to face mask oxygen  Post-op Assessment: Report given to RN and Post -op Vital signs reviewed and stable  Post vital signs: Reviewed and stable  Last Vitals:  Vitals Value Taken Time  BP    Temp    Pulse    Resp    SpO2      Last Pain:  Vitals:   05/31/20 1603  TempSrc: Oral  PainSc: 2       Patients Stated Pain Goal: 1 (17/35/67 0141)  Complications: No complications documented.

## 2020-05-31 NOTE — Anesthesia Procedure Notes (Signed)
Procedure Name: LMA Insertion Date/Time: 05/31/2020 7:56 PM Performed by: Lollie Sails, CRNA Pre-anesthesia Checklist: Patient identified, Emergency Drugs available, Suction available, Patient being monitored and Timeout performed Patient Re-evaluated:Patient Re-evaluated prior to induction Oxygen Delivery Method: Circle system utilized Preoxygenation: Pre-oxygenation with 100% oxygen Induction Type: IV induction Ventilation: Mask ventilation without difficulty LMA: LMA inserted LMA Size: 4.0 Number of attempts: 1 Placement Confirmation: positive ETCO2 and breath sounds checked- equal and bilateral Tube secured with: Tape Dental Injury: Teeth and Oropharynx as per pre-operative assessment

## 2020-05-31 NOTE — Op Note (Signed)
Operative Note  Preoperative diagnosis:  1.  Left ureteral stone 2. Left flank pain 3. Bilateral renal stones  Postoperative diagnosis: 1.  Left ureteral stone 2. Left flank pain 3. Bilateral renal stones  Procedure(s): 1.  Cystoscopy 2. Left retrograde pyelogram with interpretation 3. Left ureteral stent placement  Surgeon: Rexene Alberts, MD  Assistants:  None  Anesthesia:  General  Complications:  None  EBL:  minimal  Specimens: none  Drains/Catheters: 1.  6Fr x 24 cm L ureteral stent  Intraoperative findings:   1. Left retrograde pyelogram demonstrates no filling defects along the course of left ureter.  No hydronephrosis.  Calyces are thin and delicate.  Contrast tracks properly. 2. Successful left ureteral stent placement with adequate curl within the renal pelvis and lower pole and to within the bladder.  Indication:  Anna Wilson is a 49 y.o. female who presents with acute onset left flank pain similar to prior stone episodes.  She has a prolonged history of become obstructed from small stones 2-3 mm.  There is no sign of infection however she is worried that her pain may become debilitating this weekend.  Her daughter is getting married this weekend and she is requesting stent placement to prevent debilitating pain.  She understands that there may not be a left ureteral stone present.  CT A/P 05/30/2024 revealed calculi present adjacent to her bladder in the pelvis possibly within a ureter however difficult to determine definitively.  After reviewing the management options for treatment, she elected to proceed with the above surgical procedure(s). We have discussed the potential benefits and risks of the procedure, side effects of the proposed treatment, the likelihood of the patient achieving the goals of the procedure, and any potential problems that might occur during the procedure or recuperation. Informed consent has been obtained.  Description of procedure: The  patient was taken to the operating room and general anesthesia was induced.  The patient was placed in the dorsal lithotomy position, prepped and draped in the usual sterile fashion, and preoperative antibiotics were administered. A preoperative time-out was performed.   Cystourethroscopy was performed.  The patient's urethra was examined and was normal. The bladder was then systematically examined in its entirety. There was no evidence for any bladder tumors, stones, or other mucosal pathology.    Attention then turned to the left ureteral orifice and a ureteral catheter was used to intubate the ureteral orifice.  Omnipaque contrast was injected through the ureteral catheter and a retrograde pyelogram was performed with findings as dictated above.  A 0.38 sensor guidewire was then advanced up the left ureter into the renal pelvis under fluoroscopic guidance.  The wire was then backloaded through the cystoscope and a ureteral stent was advance over the wire using Seldinger technique.  The stent was positioned appropriately under fluoroscopic and cystoscopic guidance.  The wire was then removed with an adequate stent curl noted in the renal pelvis/lower pole as well as in the bladder.  The bladder was then emptied and the procedure ended.  The patient appeared to tolerate the procedure well and without complications.  The patient was able to be awakened and transferred to the recovery unit in satisfactory condition.   Plan: Follow-up for staged cystoscopy, left ureteroscopy, laser lithotripsy of ureteral or renal stones, stent exchange versus removal.  We will submit surgery letter.  Matt R. [facility]  Pager: [phone]

## 2020-05-31 NOTE — Anesthesia Postprocedure Evaluation (Signed)
Anesthesia Post Note  Patient: Anna Wilson  Procedure(s) Performed: CYSTOSCOPY WITH RETROGRADE PYELOGRAM/URETERAL STENT PLACEMENT (Left )     Patient location during evaluation: PACU Anesthesia Type: General Level of consciousness: awake and alert and oriented Pain management: pain level controlled Vital Signs Assessment: post-procedure vital signs reviewed and stable Respiratory status: spontaneous breathing, nonlabored ventilation and respiratory function stable Cardiovascular status: blood pressure returned to baseline and stable Postop Assessment: no apparent nausea or vomiting Anesthetic complications: no   No complications documented.  Last Vitals:  Vitals:   05/31/20 2045 05/31/20 2100  BP: 112/77 110/74  Pulse: 74 76  Resp: 13 16  Temp: 36.8 C 36.8 C  SpO2: 100% 100%    Last Pain:  Vitals:   05/31/20 2100  TempSrc:   PainSc: 0-No pain                 Jameica Couts A.

## 2020-05-31 NOTE — Anesthesia Preprocedure Evaluation (Addendum)
Anesthesia Evaluation  Patient identified by MRN, date of birth, ID band Patient awake    Reviewed: Allergy & Precautions, NPO status , Patient's Chart, lab work & pertinent test results  History of Anesthesia Complications (+) PONV  Airway Mallampati: II  TM Distance: >3 FB Neck ROM: Full    Dental no notable dental hx. (+) Teeth Intact   Pulmonary former smoker,  Covid infection 8/21 asymptomatic now   Pulmonary exam normal breath sounds clear to auscultation       Cardiovascular hypertension, Pt. on medications Normal cardiovascular exam Rhythm:Regular Rate:Normal     Neuro/Psych  Headaches, PSYCHIATRIC DISORDERS Anxiety ADHD   GI/Hepatic Neg liver ROS, GERD  Medicated and Controlled,Hx/o esophageal spasms   Endo/Other  Hyperlipidemia  Renal/GU Renal diseaseLeft ureteral calculus  negative genitourinary   Musculoskeletal  (+) Arthritis , Osteoarthritis,  Ankylosing spondylitis DDD lumbar spine   Abdominal   Peds  Hematology negative hematology ROS (+)   Anesthesia Other Findings   Reproductive/Obstetrics                        Anesthesia Physical Anesthesia Plan  ASA: II and emergent  Anesthesia Plan: General   Post-op Pain Management:    Induction: Intravenous  PONV Risk Score and Plan: 4 or greater and Scopolamine patch - Pre-op, Ondansetron, Dexamethasone, Diphenhydramine, Midazolam and Treatment may vary due to age or medical condition  Airway Management Planned: LMA  Additional Equipment:   Intra-op Plan:   Post-operative Plan: Extubation in OR  Informed Consent: I have reviewed the patients History and Physical, chart, labs and discussed the procedure including the risks, benefits and alternatives for the proposed anesthesia with the patient or authorized representative who has indicated his/her understanding and acceptance.     Dental advisory given  Plan  Discussed with: Anesthesiologist and CRNA  Anesthesia Plan Comments:         Anesthesia Quick Evaluation

## 2020-05-31 NOTE — Discharge Instructions (Signed)
Activity:  You are encouraged to ambulate frequently (about every hour during waking hours) to help prevent blood clots from forming in your legs or lungs.  However, you should not engage in any heavy lifting (> 10-15 lbs), strenuous activity, or straining.   Diet: You should advance your diet as instructed by your physician.  It will be normal to have some bloating, nausea, and abdominal discomfort intermittently.   Prescriptions:  You will be provided a prescription for pain medication to take as needed.  If your pain is not severe enough to require the prescription pain medication, you may take extra strength Tylenol instead which will have less side effects.  You should also take a prescribed stool softener to avoid straining with bowel movements as the prescription pain medication may constipate you.   What to call us about: You should call the office [phone]) if you develop fever > 101 or develop persistent vomiting. Activity:  You are encouraged to ambulate frequently (about every hour during waking hours) to help prevent blood clots from forming in your legs or lungs.  However, you should not engage in any heavy lifting (> 10-15 lbs), strenuous activity, or straining.  You have a left ureteral stent in place. This is temporary. F/u for staged procedure to treat stone.   General Anesthesia, Adult, Care After This sheet gives you information about how to care for yourself after your procedure. Your health care provider may also give you more specific instructions. If you have problems or questions, contact your health care provider. What can I expect after the procedure? After the procedure, the following side effects are common:  Pain or discomfort at the IV site.  Nausea.  Vomiting.  Sore throat.  Trouble concentrating.  Feeling cold or chills.  Weak or tired.  Sleepiness and fatigue.  Soreness and body aches. These side effects can affect parts of the body that  were not involved in surgery. Follow these instructions at home:  For at least 24 hours after the procedure:  Have a responsible adult stay with you. It is important to have someone help care for you until you are awake and alert.  Rest as needed.  Do not: ? Participate in activities in which you could fall or become injured. ? Drive. ? Use heavy machinery. ? Drink alcohol. ? Take sleeping pills or medicines that cause drowsiness. ? Make important decisions or sign legal documents. ? Take care of children on your own. Eating and drinking  Follow any instructions from your health care provider about eating or drinking restrictions.  When you feel hungry, start by eating small amounts of foods that are soft and easy to digest (bland), such as toast. Gradually return to your regular diet.  Drink enough fluid to keep your urine pale yellow.  If you vomit, rehydrate by drinking water, juice, or clear broth. General instructions  If you have sleep apnea, surgery and certain medicines can increase your risk for breathing problems. Follow instructions from your health care provider about wearing your sleep device: ? Anytime you are sleeping, including during daytime naps. ? While taking prescription pain medicines, sleeping medicines, or medicines that make you drowsy.  Return to your normal activities as told by your health care provider. Ask your health care provider what activities are safe for you.  Take over-the-counter and prescription medicines only as told by your health care provider.  If you smoke, do not smoke without supervision.  Keep all follow-up visits as  told by your health care provider. This is important. Contact a health care provider if:  You have nausea or vomiting that does not get better with medicine.  You cannot eat or drink without vomiting.  You have pain that does not get better with medicine.  You are unable to pass urine.  You develop a skin  rash.  You have a fever.  You have redness around your IV site that gets worse. Get help right away if:  You have difficulty breathing.  You have chest pain.  You have blood in your urine or stool, or you vomit blood. Summary  After the procedure, it is common to have a sore throat or nausea. It is also common to feel tired.  Have a responsible adult stay with you for the first 24 hours after general anesthesia. It is important to have someone help care for you until you are awake and alert.  When you feel hungry, start by eating small amounts of foods that are soft and easy to digest (bland), such as toast. Gradually return to your regular diet.  Drink enough fluid to keep your urine pale yellow.  Return to your normal activities as told by your health care provider. Ask your health care provider what activities are safe for you. This information is not intended to replace advice given to you by your health care provider. Make sure you discuss any questions you have with your health care provider. Document Revised: 07/31/2017 Document Reviewed: 03/13/2017 Elsevier Patient Education  [place].

## 2020-05-31 NOTE — H&P (Signed)
Urology Preoperative H&P   Chief Complaint: Acute left flank pain  History of Present Illness: Anna Wilson is a 49 y.o. female who presents with acute left-sided flank pain proximately 1 day.  Denies any nausea or emesis.  She denies fevers or chills.  She denies dysuria or hematuria.  Urinalysis in the office demonstrated 2+ blood, 10-20 red blood cells, no nitrates, no leukocytes.  She thinks she is having acute left-sided ureteral stone pain.  She does have a long history of previous urolithiasis.  She did have a history of refractory symptoms secondary to approximately 3 mm distal ureteral calculus.  She is also had a CT previously demonstrated small bilateral calculi.  She has a history of prior ureteroscopy in 2012, 2017 in 2021.  All these were associated with small size stones approximately 3 mm.   Her daughter is getting married this weekend and the rehearsal dinner for tomorrow.  She is worried that she will have debilitating stone pain this weekend that would make her miss these events.  CT A/P obtained the office on 05/31/2020 reveals calculi demonstrated in the pelvis however is unclear if these calculi are actually within the ureter or phleboliths.  Given her left-sided flank pain with history of prior obstruction with very small calculi and her circumstance of her daughter getting married this weekend, we discussed offering cystoscopy, left retrograde pyelogram, left ureteral stent placement for pain management.  She understands that there may not be a stone within her ureter however she wishes stent placement today given the circumstances.    Past Medical History:  Diagnosis Date  . Anxiety   . Chest pain   . Esophageal spasm   . High cholesterol   . Hypertension   . Renal disorder     Past Surgical History:  Procedure Laterality Date  . BACK SURGERY    . CHOLECYSTECTOMY  2009  . CYSTOSCOPY/RETROGRADE/URETEROSCOPY/STONE EXTRACTION WITH BASKET Right 02/25/2020    Procedure: CYSTOSCOPY RETROGRADE/ URETEROSCOPY/STONE EXTRACTION WITH BASKET/URETERAL STENT PLACEMENT ;  Surgeon: Cleon Gustin, MD;  Location: [facility];  Service: Urology;  Laterality: Right;  . ENDOMETRIOSIS SURGERY  1992  . HERNIA REPAIR  2009   WITH TUMMY TUCK  . KNEE ALC Left 2005  . KNEE ARTHROSCOPY W/ ACL RECONSTRUCTION Left 2017   LEFT KNEE ACL TEAR  . KNEE SURGERY Left 2004   SCAR TISSUE REMOVAL  . LEFT HEART CATH AND CORONARY ANGIOGRAPHY N/A 03/17/2017   Procedure: LEFT HEART CATH AND CORONARY ANGIOGRAPHY;  Surgeon: Burnell Blanks, MD;  Location: [facility];  Service: Cardiovascular;  Laterality: N/A;  . STENT FOR KIDNEY STONES  2014  . TOTAL ABDOMINAL HYSTERECTOMY  2017    Allergies:  Allergies  Allergen Reactions  . Nsaids Other (See Comments)    Ulcers  . Other Other (See Comments)    Ulcers  . Sulfa Antibiotics Other (See Comments)    Pt states "I bleed from my body orficese when I take sulfa drugs"  . Sulfasalazine Other (See Comments)    Pt states "I bleed from my body orficese when I take sulfa drugs"    Family History  Problem Relation Age of Onset  . Hypertension Mother   . Hypertension Father   . Hypothyroidism Brother   . Rheum arthritis Daughter   . Asthma Daughter   . Hypothyroidism Brother   . Asthma Son     Social History:  reports that she has quit smoking. She has never used smokeless tobacco.  She reports current alcohol use. She reports that she does not use drugs.  ROS: A complete review of systems was performed.  All systems are negative except for pertinent findings as noted.  Physical Exam:  Vital signs in last 24 hours: Temp:  [98.9 F (37.2 C)] 98.9 F (37.2 C) (10/21 1603) Pulse Rate:  [108] 108 (10/21 1603) Resp:  [16] 16 (10/21 1603) BP: (109)/(77) 109/77 (10/21 1603) SpO2:  [100 %] 100 % (10/21 1603) Weight:  [58.1 kg] 58.1 kg (10/21 1603) Constitutional:  Alert and oriented, No acute  distress Cardiovascular: Regular rate and rhythm Respiratory: Normal respiratory effort, Lungs clear bilaterally GI: Abdomen is soft, nontender, nondistended, no abdominal masses GU: Left-sided CVA tenderness Lymphatic: No lymphadenopathy Neurologic: Grossly intact, no focal deficits Psychiatric: Normal mood and affect  Laboratory Data:  Recent Labs    05/31/20 1600  WBC 5.9  HGB 12.8  HCT 39.9  PLT 321    Recent Labs    05/31/20 1600  NA 138  K 3.5  CL 106  GLUCOSE 86  BUN 12  CALCIUM 9.4  CREATININE 1.02*     Results for orders placed or performed during the hospital encounter of 05/31/20 (from the past 24 hour(s))  Respiratory Panel by RT PCR (Flu A&B, Covid) - Nasopharyngeal Swab     Status: None   Collection Time: 05/31/20  3:41 PM   Specimen: Nasopharyngeal Swab  Result Value Ref Range   SARS Coronavirus 2 by RT PCR NEGATIVE NEGATIVE   Influenza A by PCR NEGATIVE NEGATIVE   Influenza B by PCR NEGATIVE NEGATIVE  CBC with Differential     Status: None   Collection Time: 05/31/20  4:00 PM  Result Value Ref Range   WBC 5.9 4.0 - 10.5 K/uL   RBC 4.54 3.87 - 5.11 MIL/uL   Hemoglobin 12.8 12.0 - 15.0 g/dL   HCT 39.9 36 - 46 %   MCV 87.9 80.0 - 100.0 fL   MCH 28.2 26.0 - 34.0 pg   MCHC 32.1 30.0 - 36.0 g/dL   RDW 13.3 11.5 - 15.5 %   Platelets 321 150 - 400 K/uL   nRBC 0.0 0.0 - 0.2 %   Neutrophils Relative % 49 %   Neutro Abs 2.9 1.7 - 7.7 K/uL   Lymphocytes Relative 41 %   Lymphs Abs 2.4 0.7 - 4.0 K/uL   Monocytes Relative 6 %   Monocytes Absolute 0.3 0.1 - 1.0 K/uL   Eosinophils Relative 3 %   Eosinophils Absolute 0.2 0.0 - 0.5 K/uL   Basophils Relative 1 %   Basophils Absolute 0.1 0.0 - 0.1 K/uL   Immature Granulocytes 0 %   Abs Immature Granulocytes 0.02 0.00 - 0.07 K/uL  Comprehensive metabolic panel     Status: Abnormal   Collection Time: 05/31/20  4:00 PM  Result Value Ref Range   Sodium 138 135 - 145 mmol/L   Potassium 3.5 3.5 - 5.1 mmol/L    Chloride 106 98 - 111 mmol/L   CO2 21 (L) 22 - 32 mmol/L   Glucose, Bld 86 70 - 99 mg/dL   BUN 12 6 - 20 mg/dL   Creatinine, Ser 1.02 (H) 0.44 - 1.00 mg/dL   Calcium 9.4 8.9 - 10.3 mg/dL   Total Protein 8.4 (H) 6.5 - 8.1 g/dL   Albumin 4.8 3.5 - 5.0 g/dL   AST 39 15 - 41 U/L   ALT 19 0 - 44 U/L   Alkaline Phosphatase 55  38 - 126 U/L   Total Bilirubin 0.9 0.3 - 1.2 mg/dL   GFR, Estimated >60 >60 mL/min   Anion gap 11 5 - 15   Recent Results (from the past 240 hour(s))  Respiratory Panel by RT PCR (Flu A&B, Covid) - Nasopharyngeal Swab     Status: None   Collection Time: 05/31/20  3:41 PM   Specimen: Nasopharyngeal Swab  Result Value Ref Range Status   SARS Coronavirus 2 by RT PCR NEGATIVE NEGATIVE Final    Comment: (NOTE) SARS-CoV-2 target nucleic acids are NOT DETECTED.  The SARS-CoV-2 RNA is generally detectable in upper respiratoy specimens during the acute phase of infection. The lowest concentration of SARS-CoV-2 viral copies this assay can detect is 131 copies/mL. A negative result does not preclude SARS-Cov-2 infection and should not be used as the sole basis for treatment or other patient management decisions. A negative result may occur with  improper specimen collection/handling, submission of specimen other than nasopharyngeal swab, presence of viral mutation(s) within the areas targeted by this assay, and inadequate number of viral copies (<131 copies/mL). A negative result must be combined with clinical observations, patient history, and epidemiological information. The expected result is Negative.  Fact Sheet for Patients:  PinkCheek.be  Fact Sheet for Healthcare Providers:  GravelBags.it  This test is no t yet approved or cleared by the [place] FDA and  has been authorized for detection and/or diagnosis of SARS-CoV-2 by FDA under an Emergency Use Authorization (EUA). This EUA will remain  in  effect (meaning this test can be used) for the duration of the COVID-19 declaration under Section 564(b)(1) of the Act, 21 U.S.C. section 360bbb-3(b)(1), unless the authorization is terminated or revoked sooner.     Influenza A by PCR NEGATIVE NEGATIVE Final   Influenza B by PCR NEGATIVE NEGATIVE Final    Comment: (NOTE) The Xpert Xpress SARS-CoV-2/FLU/RSV assay is intended as an aid in  the diagnosis of influenza from Nasopharyngeal swab specimens and  should not be used as a sole basis for treatment. Nasal washings and  aspirates are unacceptable for Xpert Xpress SARS-CoV-2/FLU/RSV  testing.  Fact Sheet for Patients: PinkCheek.be  Fact Sheet for Healthcare Providers: GravelBags.it  This test is not yet approved or cleared by the [place] FDA and  has been authorized for detection and/or diagnosis of SARS-CoV-2 by  FDA under an Emergency Use Authorization (EUA). This EUA will remain  in effect (meaning this test can be used) for the duration of the  Covid-19 declaration under Section 564(b)(1) of the Act, 21  U.S.C. section 360bbb-3(b)(1), unless the authorization is  terminated or revoked. Performed at [facility], [place] [address] [place], [place] [address]     Renal Function: Recent Labs    05/31/20 1600  CREATININE 1.02*   Estimated Creatinine Clearance: 58.9 mL/min (A) (by C-G formula based on SCr of 1.02 mg/dL (H)).  Radiologic Imaging: No results found.  I independently reviewed the above imaging studies.  Assessment and Plan Cherissa Hook is a 49 y.o. female with:  1.  Left flank pain, possibly due to distal left ureteral stone.  CT A/P 10//2021 reveals calculi within her pelvis however difficult to determine whether these calculi are actually in her left ureter.  As her daughter is getting married this weekend, she desires left ureteral stent placement so she would not have  debilitating pain over the weekend.  We will then plan to perform stage cystoscopy, left ureteroscopy, possible laser lithotripsy  with stent exchange versus removal.  -The risks, benefits and alternatives of cystoscopy with left JJ stent placement was discussed with the patient.  Risks include, but are not limited to: bleeding, urinary tract infection, ureteral injury, ureteral stricture disease, chronic pain, urinary symptoms, bladder injury, stent migration, the need for nephrostomy tube placement, MI, CVA, DVT, PE and the inherent risks with general anesthesia.  The patient voices understanding and wishes to proceed.   Matt R. Aleem Elza MD 05/31/2020, 5:01 PM  [facility] Pager: [phone]): [phone]

## 2020-06-01 ENCOUNTER — Encounter (HOSPITAL_COMMUNITY): Payer: Self-pay | Admitting: Urology

## 2020-06-01 ENCOUNTER — Emergency Department (HOSPITAL_COMMUNITY)
Admission: EM | Admit: 2020-06-01 | Discharge: 2020-06-01 | Disposition: A | Discharge status: Home / Self Care | Payer: No Typology Code available for payment source | Attending: Emergency Medicine | Admitting: Emergency Medicine

## 2020-06-01 ENCOUNTER — Emergency Department (HOSPITAL_COMMUNITY): Payer: No Typology Code available for payment source

## 2020-06-01 DIAGNOSIS — G8918 Other acute postprocedural pain: Secondary | ICD-10-CM | POA: Diagnosis not present

## 2020-06-01 DIAGNOSIS — R109 Unspecified abdominal pain: Secondary | ICD-10-CM | POA: Diagnosis present

## 2020-06-01 DIAGNOSIS — K219 Gastro-esophageal reflux disease without esophagitis: Secondary | ICD-10-CM | POA: Insufficient documentation

## 2020-06-01 DIAGNOSIS — Z87891 Personal history of nicotine dependence: Secondary | ICD-10-CM | POA: Diagnosis not present

## 2020-06-01 DIAGNOSIS — N1330 Unspecified hydronephrosis: Secondary | ICD-10-CM

## 2020-06-01 DIAGNOSIS — N23 Unspecified renal colic: Secondary | ICD-10-CM

## 2020-06-01 DIAGNOSIS — Z87442 Personal history of urinary calculi: Secondary | ICD-10-CM | POA: Diagnosis not present

## 2020-06-01 DIAGNOSIS — I10 Essential (primary) hypertension: Secondary | ICD-10-CM | POA: Diagnosis not present

## 2020-06-01 LAB — BASIC METABOLIC PANEL
Anion gap: 9 (ref 5–15)
BUN: 14 mg/dL (ref 6–20)
CO2: 22 mmol/L (ref 22–32)
Calcium: 9.7 mg/dL (ref 8.9–10.3)
Chloride: 109 mmol/L (ref 98–111)
Creatinine, Ser: 1.08 mg/dL — ABNORMAL HIGH (ref 0.44–1.00)
GFR, Estimated: >60 mL/min (ref >60)
Glucose, Bld: 143 mg/dL — ABNORMAL HIGH (ref 70–99)
Potassium: 4 mmol/L (ref 3.5–5.1)
Sodium: 140 mmol/L (ref 135–145)

## 2020-06-01 LAB — URINALYSIS, ROUTINE W REFLEX MICROSCOPIC: RBC / HPF: >50 RBC/hpf — ABNORMAL HIGH (ref 0–5)

## 2020-06-01 LAB — CBC
HCT: 36.7 % (ref 36.0–46.0)
Hemoglobin: 12.2 g/dL (ref 12.0–15.0)
MCH: 28.4 pg (ref 26.0–34.0)
MCHC: 33.2 g/dL (ref 30.0–36.0)
MCV: 85.3 fL (ref 80.0–100.0)
Platelets: 327 10*3/uL (ref 150–400)
RBC: 4.3 MIL/uL (ref 3.87–5.11)
RDW: 13.2 % (ref 11.5–15.5)
WBC: 7.7 10*3/uL (ref 4.0–10.5)
nRBC: 0 % (ref 0.0–0.2)

## 2020-06-01 MED ORDER — KETOROLAC TROMETHAMINE 30 MG/ML IJ SOLN
30.0000 mg | Freq: Once | INTRAMUSCULAR | Status: AC
  Administered 2020-06-01: 30 mg via INTRAVENOUS
  Filled 2020-06-01: qty 1

## 2020-06-01 MED ORDER — NAPROXEN 375 MG PO TABS: 375.0000 mg | ORAL_TABLET | Freq: Two times a day (BID) | ORAL | 0 refills | Status: DC

## 2020-06-01 MED ORDER — TAMSULOSIN HCL 0.4 MG PO CAPS
0.4000 mg | ORAL_CAPSULE | Freq: Every day | ORAL | 2 refills | Status: DC
Start: 2020-06-01 — End: 2020-06-11

## 2020-06-01 MED ORDER — SODIUM CHLORIDE 0.9 % IV SOLN
2.0000 g | Freq: Once | INTRAVENOUS | Status: AC
  Administered 2020-06-01: 2 g via INTRAVENOUS
  Filled 2020-06-01: qty 20

## 2020-06-01 MED ORDER — HYDROMORPHONE HCL 2 MG PO TABS: 2.0000 mg | ORAL_TABLET | Freq: Two times a day (BID) | ORAL | 0 refills | Status: AC | PRN

## 2020-06-01 MED ORDER — ONDANSETRON HCL 4 MG/2ML IJ SOLN
4.0000 mg | Freq: Once | INTRAMUSCULAR | Status: AC
  Administered 2020-06-01: 4 mg via INTRAVENOUS
  Filled 2020-06-01: qty 2

## 2020-06-01 MED ORDER — HYDROMORPHONE HCL 1 MG/ML IJ SOLN
1.0000 mg | Freq: Once | INTRAMUSCULAR | Status: AC
  Administered 2020-06-01: 1 mg via INTRAVENOUS
  Filled 2020-06-01: qty 1

## 2020-06-01 MED ORDER — DOCUSATE SODIUM 100 MG PO CAPS: 100.0000 mg | ORAL_CAPSULE | Freq: Every day | ORAL | 2 refills | Status: AC | PRN

## 2020-06-01 MED ORDER — PHENAZOPYRIDINE HCL 100 MG PO TABS: 100.0000 mg | ORAL_TABLET | Freq: Three times a day (TID) | ORAL | 1 refills | Status: DC | PRN

## 2020-06-01 MED ORDER — OXYBUTYNIN CHLORIDE ER 10 MG PO TB24: 10.0000 mg | ORAL_TABLET | Freq: Every day | ORAL | 0 refills | Status: DC

## 2020-06-01 NOTE — ED Notes (Signed)
Pt currently speaking to Dr. Abner Greenspan on her personal phone. Pt stating to Dr. Abner Greenspan that her kidney is swollen and stent needs to be flushed. Pt demanding to speak to Dr. Tomi Bamberger and states she will not leave at this time.

## 2020-06-01 NOTE — Discharge Instructions (Signed)
Continue your current medications.  Take the short course of nsaids to help with your pain.  Follow up with Dr Abner Greenspan as discussed.

## 2020-06-01 NOTE — ED Notes (Signed)
Dr. Tomi Bamberger aware of patients concerns listed in previous note and is bedside at this time. New order for US renal.

## 2020-06-01 NOTE — ED Provider Notes (Addendum)
[facility] DEPT Provider Note   CSN: [id number] Arrival date & time: 06/01/20  4098     History Chief Complaint  Patient presents with  . Post-op Problem    Anna Wilson is a 49 y.o. female.  HPI   Patient presents the ED for evaluation of flank and abdominal pain.  Patient has history of kidney stones.  Patient started having flank pain a couple of days ago.  She was seen by urology as an outpatient and they noted hematuria.  Patient had CT scans in the urology office.  Findings were not definitive for ureteral stone but the patient had significant history of similar symptoms associated ureteral stone so stent was placed yesterday.  Patient states she does not feel like that has helped at all.  She feels like her kidney is swelling inside.  Patient has had persistent pain.  It is increasing in severity and not responding to the oral pain medications.  She also has felt that her urine has not changed color as expected from the Azo.  She called the urologist on call and was told to come to the office in the morning or if she was having too much pain to come to the ED.  Patient presents to the ED because of this persistent pain.  Patient is very concerned as her daughter is getting married this weekend and she needs to leave town.  Past Medical History:  Diagnosis Date  . Anxiety   . Chest pain   . Esophageal spasm   . High cholesterol   . Hypertension   . Renal disorder     Patient Active Problem List   Diagnosis Date Noted  . Ankylosing spondylitis ([place]) 05/31/2020  . Attention deficit hyperactivity disorder (ADHD) 05/31/2020  . Benign essential HTN 02/24/2020  . Nephrolithiasis 02/24/2020  . GERD (gastroesophageal reflux disease) 02/24/2020  . Hyperlipidemia 02/24/2020  . Pyelonephritis 02/24/2020  . Precordial pain     Past Surgical History:  Procedure Laterality Date  . BACK SURGERY    . CHOLECYSTECTOMY  2009  . CYSTOSCOPY W/ URETERAL  STENT PLACEMENT Left 05/31/2020   Procedure: CYSTOSCOPY WITH RETROGRADE PYELOGRAM/URETERAL STENT PLACEMENT;  Surgeon: Janith Lima, MD;  Location: [facility];  Service: Urology;  Laterality: Left;  . CYSTOSCOPY/RETROGRADE/URETEROSCOPY/STONE EXTRACTION WITH BASKET Right 02/25/2020   Procedure: CYSTOSCOPY RETROGRADE/ URETEROSCOPY/STONE EXTRACTION WITH BASKET/URETERAL STENT PLACEMENT ;  Surgeon: Cleon Gustin, MD;  Location: [facility];  Service: Urology;  Laterality: Right;  . ENDOMETRIOSIS SURGERY  1992  . HERNIA REPAIR  2009   WITH TUMMY TUCK  . KNEE ALC Left 2005  . KNEE ARTHROSCOPY W/ ACL RECONSTRUCTION Left 2017   LEFT KNEE ACL TEAR  . KNEE SURGERY Left 2004   SCAR TISSUE REMOVAL  . LEFT HEART CATH AND CORONARY ANGIOGRAPHY N/A 03/17/2017   Procedure: LEFT HEART CATH AND CORONARY ANGIOGRAPHY;  Surgeon: Burnell Blanks, MD;  Location: [facility];  Service: Cardiovascular;  Laterality: N/A;  . STENT FOR KIDNEY STONES  2014  . TOTAL ABDOMINAL HYSTERECTOMY  2017     OB History   No obstetric history on file.     Family History  Problem Relation Age of Onset  . Hypertension Mother   . Hypertension Father   . Hypothyroidism Brother   . Rheum arthritis Daughter   . Asthma Daughter   . Hypothyroidism Brother   . Asthma Son     Social History   Tobacco Use  . Smoking  status: Former Research scientist (life sciences)  . Smokeless tobacco: Never Used  Vaping Use  . Vaping Use: Never used  Substance Use Topics  . Alcohol use: Yes  . Drug use: No    Home Medications Prior to Admission medications   Medication Sig Start Date End Date Taking? Authorizing Provider  Adalimumab 40 MG/0.8ML PNKT Inject 40 mg into the skin every 14 (fourteen) days.   Yes [provider]  ADDERALL XR 20 MG 24 hr capsule Take 20 mg by mouth at bedtime. 04/18/20  Yes [provider]  amLODipine-valsartan (EXFORGE) 10-320 MG tablet Take 1 tablet by mouth daily. 01/26/20  Yes [provider]    docusate sodium (COLACE) 100 MG capsule Take 1 capsule (100 mg total) by mouth daily as needed. 05/31/20 05/31/21 Yes Janith Lima, MD  DULoxetine (CYMBALTA) 60 MG capsule Take 60 mg by mouth daily. 01/22/20  Yes [provider]  escitalopram (LEXAPRO) 20 MG tablet Take 20 mg by mouth daily. 04/27/20  Yes [provider]  Multiple Vitamin (MULTIVITAMIN ADULT PO) Take 1 tablet by mouth daily.   Yes [provider]  Omega-3 1000 MG CAPS Take 1,000 mg by mouth daily.   Yes [provider]  Ondansetron HCl (ZOFRAN PO) Take by mouth. 1 tablet q6hrs prn nausea   Yes [provider]  oxyCODONE-acetaminophen (PERCOCET) 5-325 MG tablet Take 1 tablet by mouth every 4 (four) hours as needed for up to 16 doses for severe pain. 05/31/20  Yes Janith Lima, MD  pantoprazole (PROTONIX) 40 MG tablet Take 40 mg by mouth 2 (two) times daily.   Yes [provider]  Red Yeast Rice Extract 600 MG CAPS Take 600 mg by mouth at bedtime.   Yes [provider]  SUMAtriptan (IMITREX) 100 MG tablet Take 100 mg by mouth every 2 (two) hours as needed for migraine or headache.  05/26/16  Yes [provider]  topiramate (TOPAMAX) 50 MG tablet Take 50 mg by mouth 2 (two) times daily. 12/14/19  Yes [provider]  cephALEXin (KEFLEX) 500 MG capsule Take 1 capsule (500 mg total) by mouth 2 (two) times daily for 3 days. Patient not taking: Reported on 06/01/2020 05/31/20 06/03/20  Janith Lima, MD  naproxen (NAPROSYN) 375 MG tablet Take 1 tablet (375 mg total) by mouth 2 (two) times daily. 06/01/20   Dorie Rank, MD  oxybutynin (DITROPAN XL) 10 MG 24 hr tablet Take 1 tablet (10 mg total) by mouth daily for 30 doses. Patient not taking: Reported on 06/01/2020 05/31/20 06/30/20  Janith Lima, MD  tamsulosin (FLOMAX) 0.4 MG CAPS capsule Take 1 capsule (0.4 mg total) by mouth daily after supper. Patient not taking: Reported on 06/01/2020 05/31/20   Janith Lima, MD    Allergies    Nsaids, Other, Sulfa antibiotics, and Sulfasalazine  Review of Systems   Review of Systems  All other systems reviewed and are negative.   Physical Exam Updated Vital Signs BP 126/81 (BP Location: Right Arm)   Pulse 65   Temp 98.4 F (36.9 C) (Oral)   Resp 17   LMP  (LMP Unknown)   SpO2 100%   Physical Exam Vitals and nursing note reviewed.  Constitutional:      General: She is not in acute distress.    Appearance: She is well-developed.  HENT:     Head: Normocephalic and atraumatic.     Right Ear: External ear normal.     Left Ear:  External ear normal.  Eyes:     General: No scleral icterus.       Right eye: No discharge.        Left eye: No discharge.     Conjunctiva/sclera: Conjunctivae normal.  Neck:     Trachea: No tracheal deviation.  Cardiovascular:     Rate and Rhythm: Normal rate and regular rhythm.  Pulmonary:     Effort: Pulmonary effort is normal. No respiratory distress.     Breath sounds: Normal breath sounds. No stridor. No wheezing or rales.  Abdominal:     General: Bowel sounds are normal. There is no distension.     Palpations: Abdomen is soft.     Tenderness: There is abdominal tenderness. There is no guarding or rebound.  Musculoskeletal:        General: No tenderness.     Cervical back: Neck supple.  Skin:    General: Skin is warm and dry.     Findings: No rash.  Neurological:     Mental Status: She is alert.     Cranial Nerves: No cranial nerve deficit (no facial droop, extraocular movements intact, no slurred speech).     Sensory: No sensory deficit.     Motor: No abnormal muscle tone or seizure activity.     Coordination: Coordination normal.     ED Results / Procedures / Treatments   Labs (all labs ordered are listed, but only abnormal results are displayed) Labs Reviewed  BASIC METABOLIC PANEL - Abnormal; Notable for the following components:      Result Value   Glucose, Bld 143 (*)     Creatinine, Ser 1.08 (*)    All other components within normal limits  CBC  URINALYSIS, ROUTINE W REFLEX MICROSCOPIC     Radiology DG Abdomen 1 View  Result Date: 06/01/2020 CLINICAL DATA:  Ureteral stent EXAM: ABDOMEN - 1 VIEW COMPARISON:  None. FINDINGS: Left double-J nephroureteral stent. Superior pigtail is partially uncoiled and overlies the left kidney. Inferior pigtail overlies the bladder. Normal bowel gas pattern. Lumbar fusion hardware. IMPRESSION: Left double-J nephroureteral stent. Electronically Signed   By: Macy Mis M.D.   On: 06/01/2020 08:23   DG C-Arm 1-60 Min-No Report  Result Date: 05/31/2020 Fluoroscopy was utilized by the requesting physician.  No radiographic interpretation.    Procedures Procedures (including critical care time)  Medications Ordered in ED Medications  HYDROmorphone (DILAUDID) injection 1 mg (1 mg Intravenous Given 06/01/20 0752)  ondansetron (ZOFRAN) injection 4 mg (4 mg Intravenous Given 06/01/20 0755)  ketorolac (TORADOL) 30 MG/ML injection 30 mg (30 mg Intravenous Given 06/01/20 1034)    ED Course  I have reviewed the triage vital signs and the nursing notes.  Pertinent labs & imaging results that were available during my care of the patient were reviewed by me and considered in my medical decision making (see chart for details).  Clinical Course as of Jun 01 1033  Fri Jun 01, 2020  0919 Labs reviewed.  CBC metabolic panel normal   [JK]  0919 Ureteral stent appears to be in the appropriate position   [JK]  0933 Patient states pain has improved but she still feels that her kidney is swollen.     [WK]  0881 Discussed case with Dr Lovena Neighbours.  Recommends toradol and nsaid to assist with pain management.  OK to dc and urology will see today in follow up.   Discussed findings with patient.  She is concerned about obstruction of the stent  and will need another procedure.  She will contact urology office.   [JK]    Clinical Course  User Index [JK] Dorie Rank, MD   MDM Rules/Calculators/A&P                          Patient presents ED with complaints of persistent flank pain. Notes from her recent urology procedure were reviewed. Patient did have a stent placed. Patient also had an IVP. No hydronephrosis previously noted.. Patient continues to have pain despite her stenting procedure. She was given IV Dilaudid as well as Toradol. Had some improvement but she is still concerned about obstruction of the stent. I discussed the case with urology. No indication for stent removal at this time as it appears to be in the appropriate position. A dose of Toradol was recommended they will follow up with patient and to discuss further treatment.  Final Clinical Impression(s) / ED Diagnoses Final diagnoses:  Ureteral colic    Rx / DC Orders ED Discharge Orders         Ordered    naproxen (NAPROSYN) 375 MG tablet  2 times daily        06/01/20 1026           Dorie Rank, MD 06/01/20 1034  Pt spoke to Dr Abner Greenspan while she was in the ED.  He has ordered an ultrasound of the kidney.  He will see patient after the ultrasound and determine further treatment.   Dorie Rank, MD 06/01/20 1115  Ultrasound shows mild left hydronephrosis   Dorie Rank, MD 06/01/20 1300

## 2020-06-01 NOTE — Consult Note (Signed)
Urology Consult   Physician requesting consult: Dr. Dorie Rank  Reason for consult: Left flank pain  History of Present Illness: Anna Wilson is a 49 y.o. presents today with left-sided flank pains after left ureteral stent placed yesterday.  Briefly, she developed acute onset of left sided flank pain approximately 2 days ago. She states that this was similar to her previous ureteral stent pains. She has a long history of previous nephrolithiasis. She did have a history of refractory symptoms secondary to approximately 3 mm distal ureteral calculus.  She is also had a CT previously demonstrated small bilateral calculi.  She has a history of prior ureteroscopy in 2012, 2017 in 2021.  All these were associated with small size stones approximately 3 mm.   CT A/P obtained the office on 05/31/2020 revealed calculi demonstrated in the pelvis however is unclear if these calculi are actually within the ureter or phleboliths.  Given her left-sided flank pain with history of prior obstruction with very small calculi and her circumstance of her daughter getting married this weekend, we discussed offering cystoscopy, left retrograde pyelogram, left ureteral stent placement for pain management. She underwent cystoscopy, left retrograde pyelogram, left ureteral stent placement last night on 05/31/2020.  She states that since stent placement, she did have initial improvement in her pain however she woke this morning and left-sided discomfort. She denies pain that is worse with urination that she feels up in her left flank. She denies fevers or chills. She denies dysuria or hematuria. She has had some increased frequency and urgency. She was given Toradol with some improvement in her pain. She has not picked up her Percocet from last night.  KUB on 06/01/2020 reveals stent in appropriate position. Renal bladder ultrasound 06/01/2020 reveals mild hydronephrosis with a proximal 125 mL in the bladder. I think this hydrois  presumed due to reflux. She does not clinically appear to be obstructed.  We did discuss either removing ureteral stent given her stent discomfort or leaving stent in place for the weekend. She prefers to leave stent in place at this time.   Anna Wilson is a 49 y.o. female who presents with acute left-sided flank pain proximately 1 day.  Denies any nausea or emesis.  She denies fevers or chills.  She denies dysuria or hematuria.  Urinalysis in the office demonstrated 2+ blood, 10-20 red blood cells, no nitrates, no leukocytes.  She thinks she is having acute left-sided ureteral stone pain.   Past Medical History:  Diagnosis Date  . Anxiety   . Chest pain   . Esophageal spasm   . High cholesterol   . Hypertension   . Renal disorder     Past Surgical History:  Procedure Laterality Date  . BACK SURGERY    . CHOLECYSTECTOMY  2009  . CYSTOSCOPY W/ URETERAL STENT PLACEMENT Left 05/31/2020   Procedure: CYSTOSCOPY WITH RETROGRADE PYELOGRAM/URETERAL STENT PLACEMENT;  Surgeon: Janith Lima, MD;  Location: [facility];  Service: Urology;  Laterality: Left;  . CYSTOSCOPY/RETROGRADE/URETEROSCOPY/STONE EXTRACTION WITH BASKET Right 02/25/2020   Procedure: CYSTOSCOPY RETROGRADE/ URETEROSCOPY/STONE EXTRACTION WITH BASKET/URETERAL STENT PLACEMENT ;  Surgeon: Cleon Gustin, MD;  Location: [facility];  Service: Urology;  Laterality: Right;  . ENDOMETRIOSIS SURGERY  1992  . HERNIA REPAIR  2009   WITH TUMMY TUCK  . KNEE ALC Left 2005  . KNEE ARTHROSCOPY W/ ACL RECONSTRUCTION Left 2017   LEFT KNEE ACL TEAR  . KNEE SURGERY Left 2004   SCAR TISSUE REMOVAL  . LEFT  HEART CATH AND CORONARY ANGIOGRAPHY N/A 03/17/2017   Procedure: LEFT HEART CATH AND CORONARY ANGIOGRAPHY;  Surgeon: Burnell Blanks, MD;  Location: [facility];  Service: Cardiovascular;  Laterality: N/A;  . STENT FOR KIDNEY STONES  2014  . TOTAL ABDOMINAL HYSTERECTOMY  2017     Current Hospital Medications:  Home meds:   No current facility-administered medications on file prior to encounter.   Current Outpatient Medications on File Prior to Encounter  Medication Sig Dispense Refill  . Adalimumab 40 MG/0.8ML PNKT Inject 40 mg into the skin every 14 (fourteen) days.    . ADDERALL XR 20 MG 24 hr capsule Take 20 mg by mouth at bedtime.    Marland Kitchen amLODipine-valsartan (EXFORGE) 10-320 MG tablet Take 1 tablet by mouth daily.    . DULoxetine (CYMBALTA) 60 MG capsule Take 60 mg by mouth daily.    Marland Kitchen escitalopram (LEXAPRO) 20 MG tablet Take 20 mg by mouth daily.    . Multiple Vitamin (MULTIVITAMIN ADULT PO) Take 1 tablet by mouth daily.    . Omega-3 1000 MG CAPS Take 1,000 mg by mouth daily.    . Ondansetron HCl (ZOFRAN PO) Take by mouth. 1 tablet q6hrs prn nausea    . pantoprazole (PROTONIX) 40 MG tablet Take 40 mg by mouth 2 (two) times daily.    . Red Yeast Rice Extract 600 MG CAPS Take 600 mg by mouth at bedtime.    . SUMAtriptan (IMITREX) 100 MG tablet Take 100 mg by mouth every 2 (two) hours as needed for migraine or headache.     . topiramate (TOPAMAX) 50 MG tablet Take 50 mg by mouth 2 (two) times daily.    . cephALEXin (KEFLEX) 500 MG capsule Take 1 capsule (500 mg total) by mouth 2 (two) times daily for 3 days. (Patient not taking: Reported on 06/01/2020) 6 capsule 0     Scheduled Meds: Continuous Infusions: PRN Meds:.  Allergies:  Allergies  Allergen Reactions  . Nsaids Other (See Comments)    Ulcers  . Other Other (See Comments)    Ulcers  . Sulfa Antibiotics Other (See Comments)    Pt states "I bleed from my body orficese when I take sulfa drugs"  . Sulfasalazine Other (See Comments)    Pt states "I bleed from my body orficese when I take sulfa drugs"    Family History  Problem Relation Age of Onset  . Hypertension Mother   . Hypertension Father   . Hypothyroidism Brother   . Rheum arthritis Daughter   . Asthma Daughter   . Hypothyroidism Brother   . Asthma Son     Social History:   reports that she has quit smoking. She has never used smokeless tobacco. She reports current alcohol use. She reports that she does not use drugs.  ROS: A complete review of systems was performed.  All systems are negative except for pertinent findings as noted.  Physical Exam:  Vital signs in last 24 hours: Temp:  [98 F (36.7 C)-98.9 F (37.2 C)] 98.4 F (36.9 C) (10/22 1140) Pulse Rate:  [65-108] 67 (10/22 1245) Resp:  [11-19] 11 (10/22 1245) BP: (101-126)/(64-87) 103/64 (10/22 1245) SpO2:  [94 %-100 %] 94 % (10/22 1245) Weight:  [58.1 kg] 58.1 kg (10/21 1603) Constitutional:  Alert and oriented, No acute distress Cardiovascular: Regular rate and rhythm Respiratory: Normal respiratory effort, Lungs clear bilaterally GI: Abdomen is soft, nontender, nondistended, no abdominal masses GU: No CVA tenderness Neurologic: Grossly intact, no focal  deficits Psychiatric: Normal mood and affect  Laboratory Data:  Recent Labs    05/31/20 1600 06/01/20 0755  WBC 5.9 7.7  HGB 12.8 12.2  HCT 39.9 36.7  PLT 321 327    Recent Labs    05/31/20 1600 06/01/20 0755  NA 138 140  K 3.5 4.0  CL 106 109  GLUCOSE 86 143*  BUN 12 14  CALCIUM 9.4 9.7  CREATININE 1.02* 1.08*     Results for orders placed or performed during the hospital encounter of 06/01/20 (from the past 24 hour(s))  CBC     Status: None   Collection Time: 06/01/20  7:55 AM  Result Value Ref Range   WBC 7.7 4.0 - 10.5 K/uL   RBC 4.30 3.87 - 5.11 MIL/uL   Hemoglobin 12.2 12.0 - 15.0 g/dL   HCT 36.7 36 - 46 %   MCV 85.3 80.0 - 100.0 fL   MCH 28.4 26.0 - 34.0 pg   MCHC 33.2 30.0 - 36.0 g/dL   RDW 13.2 11.5 - 15.5 %   Platelets 327 150 - 400 K/uL   nRBC 0.0 0.0 - 0.2 %  Basic metabolic panel     Status: Abnormal   Collection Time: 06/01/20  7:55 AM  Result Value Ref Range   Sodium 140 135 - 145 mmol/L   Potassium 4.0 3.5 - 5.1 mmol/L   Chloride 109 98 - 111 mmol/L   CO2 22 22 - 32 mmol/L   Glucose, Bld 143  (H) 70 - 99 mg/dL   BUN 14 6 - 20 mg/dL   Creatinine, Ser 1.08 (H) 0.44 - 1.00 mg/dL   Calcium 9.7 8.9 - 10.3 mg/dL   GFR, Estimated >60 >60 mL/min   Anion gap 9 5 - 15  Urinalysis, Routine w reflex microscopic Urine, Clean Catch     Status: Abnormal   Collection Time: 06/01/20 12:10 PM  Result Value Ref Range   Color, Urine RED (A) YELLOW   APPearance TURBID (A) CLEAR   Specific Gravity, Urine  1.005 - 1.030    TEST NOT REPORTED DUE TO COLOR INTERFERENCE OF URINE PIGMENT   pH  5.0 - 8.0    TEST NOT REPORTED DUE TO COLOR INTERFERENCE OF URINE PIGMENT   Glucose, UA (A) NEGATIVE mg/dL    TEST NOT REPORTED DUE TO COLOR INTERFERENCE OF URINE PIGMENT   Hgb urine dipstick (A) NEGATIVE    TEST NOT REPORTED DUE TO COLOR INTERFERENCE OF URINE PIGMENT   Bilirubin Urine (A) NEGATIVE    TEST NOT REPORTED DUE TO COLOR INTERFERENCE OF URINE PIGMENT   Ketones, ur (A) NEGATIVE mg/dL    TEST NOT REPORTED DUE TO COLOR INTERFERENCE OF URINE PIGMENT   Protein, ur (A) NEGATIVE mg/dL    TEST NOT REPORTED DUE TO COLOR INTERFERENCE OF URINE PIGMENT   Nitrite (A) NEGATIVE    TEST NOT REPORTED DUE TO COLOR INTERFERENCE OF URINE PIGMENT   Leukocytes,Ua (A) NEGATIVE    TEST NOT REPORTED DUE TO COLOR INTERFERENCE OF URINE PIGMENT   RBC / HPF >50 (H) 0 - 5 RBC/hpf   WBC, UA 21-50 0 - 5 WBC/hpf   Bacteria, UA FEW (A) NONE SEEN   Recent Results (from the past 240 hour(s))  Respiratory Panel by RT PCR (Flu A&B, Covid) - Nasopharyngeal Swab     Status: None   Collection Time: 05/31/20  3:41 PM   Specimen: Nasopharyngeal Swab  Result Value Ref Range Status   SARS Coronavirus 2  by RT PCR NEGATIVE NEGATIVE Final    Comment: (NOTE) SARS-CoV-2 target nucleic acids are NOT DETECTED.  The SARS-CoV-2 RNA is generally detectable in upper respiratoy specimens during the acute phase of infection. The lowest concentration of SARS-CoV-2 viral copies this assay can detect is 131 copies/mL. A negative result does not  preclude SARS-Cov-2 infection and should not be used as the sole basis for treatment or other patient management decisions. A negative result may occur with  improper specimen collection/handling, submission of specimen other than nasopharyngeal swab, presence of viral mutation(s) within the areas targeted by this assay, and inadequate number of viral copies (<131 copies/mL). A negative result must be combined with clinical observations, patient history, and epidemiological information. The expected result is Negative.  Fact Sheet for Patients:  PinkCheek.be  Fact Sheet for Healthcare Providers:  GravelBags.it  This test is no t yet approved or cleared by the [place] FDA and  has been authorized for detection and/or diagnosis of SARS-CoV-2 by FDA under an Emergency Use Authorization (EUA). This EUA will remain  in effect (meaning this test can be used) for the duration of the COVID-19 declaration under Section 564(b)(1) of the Act, 21 U.S.C. section 360bbb-3(b)(1), unless the authorization is terminated or revoked sooner.     Influenza A by PCR NEGATIVE NEGATIVE Final   Influenza B by PCR NEGATIVE NEGATIVE Final    Comment: (NOTE) The Xpert Xpress SARS-CoV-2/FLU/RSV assay is intended as an aid in  the diagnosis of influenza from Nasopharyngeal swab specimens and  should not be used as a sole basis for treatment. Nasal washings and  aspirates are unacceptable for Xpert Xpress SARS-CoV-2/FLU/RSV  testing.  Fact Sheet for Patients: PinkCheek.be  Fact Sheet for Healthcare Providers: GravelBags.it  This test is not yet approved or cleared by the [place] FDA and  has been authorized for detection and/or diagnosis of SARS-CoV-2 by  FDA under an Emergency Use Authorization (EUA). This EUA will remain  in effect (meaning this test can be used) for the  duration of the  Covid-19 declaration under Section 564(b)(1) of the Act, 21  U.S.C. section 360bbb-3(b)(1), unless the authorization is  terminated or revoked. Performed at [facility], [place] [address], [place], [place] [address]     Renal Function: Recent Labs    05/31/20 1600 06/01/20 0755  CREATININE 1.02* 1.08*   Estimated Creatinine Clearance: 55.6 mL/min (A) (by C-G formula based on SCr of 1.08 mg/dL (H)).  Radiologic Imaging: DG Abdomen 1 View  Result Date: 06/01/2020 CLINICAL DATA:  Ureteral stent EXAM: ABDOMEN - 1 VIEW COMPARISON:  None. FINDINGS: Left double-J nephroureteral stent. Superior pigtail is partially uncoiled and overlies the left kidney. Inferior pigtail overlies the bladder. Normal bowel gas pattern. Lumbar fusion hardware. IMPRESSION: Left double-J nephroureteral stent. Electronically Signed   By: Macy Mis M.D.   On: 06/01/2020 08:23   US RENAL  Result Date: 06/01/2020 CLINICAL DATA:  Acute left flank pain. EXAM: RENAL / URINARY TRACT ULTRASOUND COMPLETE COMPARISON:  February 24, 2020. FINDINGS: Right Kidney: Renal measurements: 10.4 x 4.8 x 4.0 cm = volume: 105 mL. 1.8 cm simple cyst is noted in lower pole. Echogenicity within normal limits. No mass or hydronephrosis visualized. Left Kidney: Renal measurements: 10.6 x 5.2 x 4.3 cm = volume: 125 mL. Mild left hydronephrosis is noted. Ureteral stent is seen extending from renal pelvis to urinary bladder. Echogenicity within normal limits. No mass visualized. Bladder: Distal portion ureteral stent is seen in the  bladder lumen. No urinary bladder wall thickening is noted. Calculated prevoid volume of 125 mL. Patient reportedly did not need to void. Other: None. IMPRESSION: Mild left hydronephrosis is noted. Left ureteral stent is noted. Simple right renal cyst. Electronically Signed   By: Marijo Conception M.D.   On: 06/01/2020 12:07   DG C-Arm 1-60 Min-No Report  Result Date:  05/31/2020 Fluoroscopy was utilized by the requesting physician.  No radiographic interpretation.    I independently reviewed the above imaging studies.  Impression/Recommendation: 1. Left flank pain: CT A/P 05/31/2020 revealed calculi within her pelvis however difficult to determine whether these calculi are actually in her left ureter. Given the circumstances of prior obstruction with 2-3 mm stones in her daughter getting married this weekend, she desired to pursue left ureteral stent placement. Left stent was placed on 05/31/2020. She is not having left stent discomfort. KUB 06/01/2020 reveals an appropriate position. RBUS 06/01/2020 reveals mild hydronephrosis, likely reflux with her stone.  -Discussed options for management including bedside cystoscopy with stent removal versus keeping stent in place with symptomatic pain control. She is worried that she may become obstructed, so she elects for continued stent. -No need for inpatient admission. Prescribed pain control with Dilaudid, Flomax, Pyridium, Ditropan for bladder spasms. We will also give her a couple days of antibiotics. -Follow-up in the clinic for preop to treat her left sided renal stones. -All questions answered to her satisfaction. -Stable for discharge from GU standpoint.  Janith Lima 06/01/2020, 1:56 PM  [facility]  Pager: [phone]   CC: Marye Round, MD

## 2020-06-01 NOTE — ED Triage Notes (Signed)
Patient arrived stating last night she had a stent placed for a kidney stone. States she took 2 42m tabs of oxycodone with no relief. Reports she has had this in the past and the stent was blocked. Reports passing some blood clots.

## 2020-06-04 NOTE — Discharge Summary (Signed)
Date of admission: 05/31/2020  Date of discharge: 05/31/2020  Admission diagnosis: Left ureteral stone  Discharge diagnosis: Left ureteral stone  Secondary diagnoses: Left renal stone  History and Physical: For full details, please see admission history and physical. Briefly, Anna Wilson is a 49 y.o. year old patient with a left ureteral stone wit flank pain for left ureteral stent placement.   Hospital Course: She underwent cysto, left ureteral stent placement on 05/31/2020, recovered well in PACU and discharged home.  Laboratory values: No results for input(s): HGB, HCT in the last 72 hours. No results for input(s): CREATININE in the last 72 hours.  Disposition: Home  Discharge instruction: The patient was instructed to be ambulatory but told to refrain from heavy lifting, strenuous activity, or driving.   Discharge medications:  Allergies as of 05/31/2020      Reactions   Nsaids Other (See Comments)   Ulcers   Other Other (See Comments)   Ulcers   Sulfa Antibiotics Other (See Comments)   Pt states "I bleed from my body orficese when I take sulfa drugs"   Sulfasalazine Other (See Comments)   Pt states "I bleed from my body orficese when I take sulfa drugs"      Medication List    STOP taking these medications   oxyCODONE-acetaminophen 5-325 MG tablet Commonly known as: PERCOCET/ROXICET     TAKE these medications   Adalimumab 40 MG/0.8ML Pnkt Inject 40 mg into the skin every 14 (fourteen) days.   amLODipine-valsartan 10-320 MG tablet Commonly known as: EXFORGE Take 1 tablet by mouth daily.   DULoxetine 60 MG capsule Commonly known as: CYMBALTA Take 60 mg by mouth daily.   MULTIVITAMIN ADULT PO Take 1 tablet by mouth daily.   Omega-3 1000 MG Caps Take 1,000 mg by mouth daily.   pantoprazole 40 MG tablet Commonly known as: PROTONIX Take 40 mg by mouth 2 (two) times daily.   Red Yeast Rice Extract 600 MG Caps Take 600 mg by mouth at bedtime.    SUMAtriptan 100 MG tablet Commonly known as: IMITREX Take 100 mg by mouth every 2 (two) hours as needed for migraine or headache.   topiramate 50 MG tablet Commonly known as: TOPAMAX Take 50 mg by mouth 2 (two) times daily.     ASK your doctor about these medications   cephALEXin 500 MG capsule Commonly known as: KEFLEX Take 1 capsule (500 mg total) by mouth 2 (two) times daily for 3 days. Ask about: Should I take this medication?       Followup:   Follow-up Information    [facility] In 2 weeks.   Contact information: [address] [place] [facility]  Pager: [phone]

## 2020-06-05 DIAGNOSIS — G5601 Carpal tunnel syndrome, right upper limb: Secondary | ICD-10-CM | POA: Diagnosis not present

## 2020-06-05 DIAGNOSIS — G5602 Carpal tunnel syndrome, left upper limb: Secondary | ICD-10-CM | POA: Diagnosis not present

## 2020-06-05 DIAGNOSIS — M67911 Unspecified disorder of synovium and tendon, right shoulder: Secondary | ICD-10-CM | POA: Diagnosis not present

## 2020-06-05 DIAGNOSIS — M67912 Unspecified disorder of synovium and tendon, left shoulder: Secondary | ICD-10-CM | POA: Diagnosis not present

## 2020-06-06 DIAGNOSIS — M450 Ankylosing spondylitis of multiple sites in spine: Secondary | ICD-10-CM | POA: Diagnosis not present

## 2020-06-06 DIAGNOSIS — M25512 Pain in left shoulder: Secondary | ICD-10-CM | POA: Diagnosis not present

## 2020-06-06 DIAGNOSIS — M25511 Pain in right shoulder: Secondary | ICD-10-CM | POA: Diagnosis not present

## 2020-06-06 DIAGNOSIS — M6281 Muscle weakness (generalized): Secondary | ICD-10-CM | POA: Diagnosis not present

## 2020-06-07 ENCOUNTER — Other Ambulatory Visit: Payer: Self-pay | Admitting: Urology

## 2020-06-07 ENCOUNTER — Other Ambulatory Visit (HOSPITAL_COMMUNITY)
Admission: RE | Admit: 2020-06-07 | Discharge: 2020-06-07 | Disposition: A | Discharge status: Home / Self Care | Payer: No Typology Code available for payment source | Source: Ambulatory Visit | Attending: Urology | Admitting: Urology

## 2020-06-07 ENCOUNTER — Other Ambulatory Visit: Payer: Self-pay

## 2020-06-07 ENCOUNTER — Encounter (HOSPITAL_BASED_OUTPATIENT_CLINIC_OR_DEPARTMENT_OTHER): Payer: Self-pay | Admitting: Urology

## 2020-06-07 DIAGNOSIS — Z20822 Contact with and (suspected) exposure to covid-19: Secondary | ICD-10-CM | POA: Insufficient documentation

## 2020-06-07 DIAGNOSIS — Z01812 Encounter for preprocedural laboratory examination: Secondary | ICD-10-CM | POA: Insufficient documentation

## 2020-06-07 LAB — SARS CORONAVIRUS 2 (TAT 6-24 HRS): SARS Coronavirus 2: NEGATIVE

## 2020-06-07 NOTE — Progress Notes (Signed)
Spoke w/ via phone for pre-op interview--- PT Lab needs dos---- EKG             Lab results------ pt had CBC, BMP done 06-01-2020 results in epic COVID test ------ done today 06-07-2020 Arrive at ------- 1245 NPO after MN NO Solid Food.  Clear liquids from MN until--- 1145 Medications to take morning of surgery ----- Topamax, Cymbalta, Lexapro, Protonix, and if needed take imitrex, one type prescription pain pill, zofran Diabetic medication ----- n/a Patient Special Instructions ----- n/a Pre-Op special Istructions ----- n/a Patient verbalized understanding of instructions that were given at this phone interview. Patient denies shortness of breath, chest pain, fever, cough at this phone interview.   PCP:  Lennette Bihari NP Cardiologist :  Previous seen one time by Dr Johnsie Cancel note in epic 01-28-2017 Chest x-ray :  CTA chest 12-21-2016 [facility] EKG :  Epic 03-17-2017;  [facility] 07-06-2019 Echo :  06-14-2015 [facility] Stress test:  Nuclear 12-22-2016 [facility] Cardiac Cath :  03-17-2017 epic Activity level:   No issues w/ sob with any activity per pt Sleep Study/ CPAP :  NO Fasting Blood Sugar :      / Checks Blood Sugar -- times a day:    N/A Blood Thinner/ Instructions Maryjane Hurter Dose:  NO ASA / Instructions/ Last Dose :  NO

## 2020-06-11 ENCOUNTER — Encounter (HOSPITAL_BASED_OUTPATIENT_CLINIC_OR_DEPARTMENT_OTHER)
Admission: RE | Disposition: A | Discharge status: Home / Self Care | Payer: Self-pay | Source: Home / Self Care | Attending: Urology

## 2020-06-11 ENCOUNTER — Ambulatory Visit (HOSPITAL_BASED_OUTPATIENT_CLINIC_OR_DEPARTMENT_OTHER): Payer: No Typology Code available for payment source | Admitting: Anesthesiology

## 2020-06-11 ENCOUNTER — Ambulatory Visit (HOSPITAL_BASED_OUTPATIENT_CLINIC_OR_DEPARTMENT_OTHER)
Admission: RE | Admit: 2020-06-11 | Discharge: 2020-06-11 | Disposition: A | Discharge status: Home / Self Care | Payer: No Typology Code available for payment source | Attending: Urology | Admitting: Urology

## 2020-06-11 ENCOUNTER — Encounter (HOSPITAL_BASED_OUTPATIENT_CLINIC_OR_DEPARTMENT_OTHER): Payer: Self-pay | Admitting: Urology

## 2020-06-11 DIAGNOSIS — M797 Fibromyalgia: Secondary | ICD-10-CM | POA: Diagnosis not present

## 2020-06-11 DIAGNOSIS — Z8616 Personal history of COVID-19: Secondary | ICD-10-CM | POA: Insufficient documentation

## 2020-06-11 DIAGNOSIS — Z888 Allergy status to other drugs, medicaments and biological substances status: Secondary | ICD-10-CM | POA: Insufficient documentation

## 2020-06-11 DIAGNOSIS — Z87891 Personal history of nicotine dependence: Secondary | ICD-10-CM | POA: Insufficient documentation

## 2020-06-11 DIAGNOSIS — Z825 Family history of asthma and other chronic lower respiratory diseases: Secondary | ICD-10-CM | POA: Diagnosis not present

## 2020-06-11 DIAGNOSIS — I10 Essential (primary) hypertension: Secondary | ICD-10-CM | POA: Insufficient documentation

## 2020-06-11 DIAGNOSIS — Z87442 Personal history of urinary calculi: Secondary | ICD-10-CM | POA: Insufficient documentation

## 2020-06-11 DIAGNOSIS — Z8349 Family history of other endocrine, nutritional and metabolic diseases: Secondary | ICD-10-CM | POA: Insufficient documentation

## 2020-06-11 DIAGNOSIS — Z79899 Other long term (current) drug therapy: Secondary | ICD-10-CM | POA: Insufficient documentation

## 2020-06-11 DIAGNOSIS — I498 Other specified cardiac arrhythmias: Secondary | ICD-10-CM | POA: Diagnosis not present

## 2020-06-11 DIAGNOSIS — Z8261 Family history of arthritis: Secondary | ICD-10-CM | POA: Diagnosis not present

## 2020-06-11 DIAGNOSIS — Z886 Allergy status to analgesic agent status: Secondary | ICD-10-CM | POA: Insufficient documentation

## 2020-06-11 DIAGNOSIS — N200 Calculus of kidney: Secondary | ICD-10-CM | POA: Insufficient documentation

## 2020-06-11 DIAGNOSIS — Z90710 Acquired absence of both cervix and uterus: Secondary | ICD-10-CM | POA: Diagnosis not present

## 2020-06-11 DIAGNOSIS — Z981 Arthrodesis status: Secondary | ICD-10-CM | POA: Diagnosis not present

## 2020-06-11 DIAGNOSIS — Z8249 Family history of ischemic heart disease and other diseases of the circulatory system: Secondary | ICD-10-CM | POA: Insufficient documentation

## 2020-06-11 DIAGNOSIS — Z882 Allergy status to sulfonamides status: Secondary | ICD-10-CM | POA: Diagnosis not present

## 2020-06-11 DIAGNOSIS — Z9049 Acquired absence of other specified parts of digestive tract: Secondary | ICD-10-CM | POA: Diagnosis not present

## 2020-06-11 DIAGNOSIS — Z87440 Personal history of urinary (tract) infections: Secondary | ICD-10-CM | POA: Insufficient documentation

## 2020-06-11 DIAGNOSIS — Z841 Family history of disorders of kidney and ureter: Secondary | ICD-10-CM | POA: Diagnosis not present

## 2020-06-11 HISTORY — DX: Personal history of other diseases of the digestive system: Z87.19

## 2020-06-11 HISTORY — DX: Other intervertebral disc degeneration, lumbosacral region without mention of lumbar back pain or lower extremity pain: M51.379

## 2020-06-11 HISTORY — DX: Frequency of micturition: R35.0

## 2020-06-11 HISTORY — DX: Other intervertebral disc degeneration, lumbosacral region: M51.37

## 2020-06-11 HISTORY — DX: Unspecified osteoarthritis, unspecified site: M19.90

## 2020-06-11 HISTORY — DX: Personal history of urinary (tract) infections: Z87.440

## 2020-06-11 HISTORY — DX: Presence of spectacles and contact lenses: Z97.3

## 2020-06-11 HISTORY — DX: Carpal tunnel syndrome, bilateral upper limbs: G56.03

## 2020-06-11 HISTORY — DX: Calculus of kidney: N20.0

## 2020-06-11 HISTORY — DX: Hyperlipidemia, unspecified: E78.5

## 2020-06-11 HISTORY — DX: Ankylosing spondylitis of unspecified sites in spine: M45.9

## 2020-06-11 HISTORY — DX: Personal history of other specified conditions: Z87.898

## 2020-06-11 HISTORY — PX: CYSTOSCOPY/RETROGRADE/URETEROSCOPY/STONE EXTRACTION WITH BASKET: SHX5317

## 2020-06-11 HISTORY — DX: Personal history of urinary calculi: Z87.442

## 2020-06-11 HISTORY — DX: Migraine, unspecified, not intractable, without status migrainosus: G43.909

## 2020-06-11 SURGERY — CYSTOSCOPY, WITH CALCULUS REMOVAL USING BASKET
Anesthesia: General | Laterality: Left

## 2020-06-11 MED ORDER — ACETAMINOPHEN 500 MG PO TABS
1000.0000 mg | ORAL_TABLET | Freq: Once | ORAL | Status: AC
  Administered 2020-06-11: 1000 mg via ORAL

## 2020-06-11 MED ORDER — MIDAZOLAM HCL 2 MG/2ML IJ SOLN
INTRAMUSCULAR | Status: DC | PRN
  Administered 2020-06-11: 2 mg via INTRAVENOUS

## 2020-06-11 MED ORDER — PHENYLEPHRINE HCL (PRESSORS) 10 MG/ML IV SOLN
INTRAVENOUS | Status: DC | PRN
  Administered 2020-06-11 (×4): 80 ug via INTRAVENOUS

## 2020-06-11 MED ORDER — IOHEXOL 300 MG/ML  SOLN
INTRAMUSCULAR | Status: DC | PRN
  Administered 2020-06-11: 7 mL via URETHRAL

## 2020-06-11 MED ORDER — DEXAMETHASONE SODIUM PHOSPHATE 10 MG/ML IJ SOLN
INTRAMUSCULAR | Status: DC | PRN
  Administered 2020-06-11: 10 mg via INTRAVENOUS

## 2020-06-11 MED ORDER — ACETAMINOPHEN 500 MG PO TABS
ORAL_TABLET | ORAL | Status: AC
  Filled 2020-06-11: qty 2

## 2020-06-11 MED ORDER — SCOPOLAMINE 1 MG/3DAYS TD PT72
MEDICATED_PATCH | TRANSDERMAL | Status: AC
  Filled 2020-06-11: qty 1

## 2020-06-11 MED ORDER — BELLADONNA ALKALOIDS-OPIUM 16.2-60 MG RE SUPP
RECTAL | Status: AC
  Filled 2020-06-11: qty 1

## 2020-06-11 MED ORDER — BELLADONNA ALKALOIDS-OPIUM 16.2-60 MG RE SUPP
RECTAL | Status: DC | PRN
  Administered 2020-06-11: 1 via RECTAL

## 2020-06-11 MED ORDER — OXYCODONE HCL 5 MG PO TABS
ORAL_TABLET | ORAL | Status: AC
  Filled 2020-06-11: qty 1

## 2020-06-11 MED ORDER — FENTANYL CITRATE (PF) 100 MCG/2ML IJ SOLN
INTRAMUSCULAR | Status: AC
  Filled 2020-06-11: qty 2

## 2020-06-11 MED ORDER — PROMETHAZINE HCL 25 MG/ML IJ SOLN: 6.2500 mg | INTRAMUSCULAR | Status: DC | PRN

## 2020-06-11 MED ORDER — LIDOCAINE 2% (20 MG/ML) 5 ML SYRINGE
INTRAMUSCULAR | Status: AC
  Filled 2020-06-11: qty 5

## 2020-06-11 MED ORDER — OXYCODONE-ACETAMINOPHEN 5-325 MG PO TABS: 1.0000 | ORAL_TABLET | ORAL | 0 refills | Status: DC | PRN

## 2020-06-11 MED ORDER — ONDANSETRON HCL 4 MG/2ML IJ SOLN
INTRAMUSCULAR | Status: AC
  Filled 2020-06-11: qty 2

## 2020-06-11 MED ORDER — HYDROMORPHONE HCL 1 MG/ML IJ SOLN: 0.2500 mg | INTRAMUSCULAR | Status: DC | PRN

## 2020-06-11 MED ORDER — CEFAZOLIN SODIUM-DEXTROSE 2-4 GM/100ML-% IV SOLN
2.0000 g | Freq: Once | INTRAVENOUS | Status: AC
  Administered 2020-06-11: 2 g via INTRAVENOUS

## 2020-06-11 MED ORDER — ONDANSETRON HCL 4 MG/2ML IJ SOLN
INTRAMUSCULAR | Status: DC | PRN
  Administered 2020-06-11: 4 mg via INTRAVENOUS

## 2020-06-11 MED ORDER — MEPERIDINE HCL 25 MG/ML IJ SOLN: 6.2500 mg | INTRAMUSCULAR | Status: DC | PRN

## 2020-06-11 MED ORDER — MIDAZOLAM HCL 2 MG/2ML IJ SOLN
INTRAMUSCULAR | Status: AC
  Filled 2020-06-11: qty 2

## 2020-06-11 MED ORDER — LACTATED RINGERS IV SOLN: INTRAVENOUS | Status: DC

## 2020-06-11 MED ORDER — CEFAZOLIN SODIUM-DEXTROSE 2-4 GM/100ML-% IV SOLN
INTRAVENOUS | Status: AC
  Filled 2020-06-11: qty 100

## 2020-06-11 MED ORDER — DEXAMETHASONE SODIUM PHOSPHATE 10 MG/ML IJ SOLN
INTRAMUSCULAR | Status: AC
  Filled 2020-06-11: qty 1

## 2020-06-11 MED ORDER — OXYCODONE HCL 5 MG PO TABS
5.0000 mg | ORAL_TABLET | Freq: Once | ORAL | Status: AC | PRN
  Administered 2020-06-11: 5 mg via ORAL

## 2020-06-11 MED ORDER — FENTANYL CITRATE (PF) 100 MCG/2ML IJ SOLN
INTRAMUSCULAR | Status: DC | PRN
Start: 2020-06-11 — End: 2020-06-11
  Administered 2020-06-11 (×3): 25 ug via INTRAVENOUS
  Administered 2020-06-11 (×2): 50 ug via INTRAVENOUS
  Administered 2020-06-11: 25 ug via INTRAVENOUS

## 2020-06-11 MED ORDER — PROPOFOL 10 MG/ML IV BOLUS
INTRAVENOUS | Status: DC | PRN
  Administered 2020-06-11: 150 mg via INTRAVENOUS

## 2020-06-11 MED ORDER — SODIUM CHLORIDE 0.9 % IR SOLN
Status: DC | PRN
  Administered 2020-06-11: 3000 mL

## 2020-06-11 MED ORDER — CEPHALEXIN 500 MG PO CAPS: 500.0000 mg | ORAL_CAPSULE | Freq: Two times a day (BID) | ORAL | 0 refills | Status: AC

## 2020-06-11 MED ORDER — PROPOFOL 10 MG/ML IV BOLUS
INTRAVENOUS | Status: AC
  Filled 2020-06-11: qty 40

## 2020-06-11 MED ORDER — PHENYLEPHRINE HCL (PRESSORS) 10 MG/ML IV SOLN
INTRAVENOUS | Status: AC
  Filled 2020-06-11: qty 1

## 2020-06-11 MED ORDER — PHENYLEPHRINE 40 MCG/ML (10ML) SYRINGE FOR IV PUSH (FOR BLOOD PRESSURE SUPPORT)
PREFILLED_SYRINGE | INTRAVENOUS | Status: AC
  Filled 2020-06-11: qty 10

## 2020-06-11 MED ORDER — OXYCODONE HCL 5 MG/5ML PO SOLN: 5.0000 mg | Freq: Once | ORAL | Status: AC | PRN

## 2020-06-11 MED ORDER — LIDOCAINE HCL (CARDIAC) PF 100 MG/5ML IV SOSY
PREFILLED_SYRINGE | INTRAVENOUS | Status: DC | PRN
  Administered 2020-06-11: 60 mg via INTRATRACHEAL

## 2020-06-11 SURGICAL SUPPLY — 27 items
BAG DRAIN URO-CYSTO SKYTR STRL (DRAIN) ×2 IMPLANT
BAG DRN UROCATH (DRAIN) ×1
BASKET ZERO TIP NITINOL 2.4FR (BASKET) ×2 IMPLANT
BSKT STON RTRVL ZERO TP 2.4FR (BASKET) ×1
CATH INTERMIT  6FR 70CM (CATHETERS) IMPLANT
CATH SET URETHRAL DILATOR (CATHETERS) IMPLANT
CATH URET 5FR 28IN OPEN ENDED (CATHETERS) ×2 IMPLANT
CATH URET DUAL LUMEN 6-10FR 50 (CATHETERS) ×2 IMPLANT
CLOTH BEACON ORANGE TIMEOUT ST (SAFETY) ×2 IMPLANT
FIBER LASER FLEXIVA 365 (UROLOGICAL SUPPLIES) IMPLANT
FIBER LASER TRAC TIP (UROLOGICAL SUPPLIES) ×2 IMPLANT
GLOVE BIO SURGEON STRL SZ7.5 (GLOVE) ×2 IMPLANT
GOWN STRL REUS W/TWL LRG LVL3 (GOWN DISPOSABLE) ×2 IMPLANT
GUIDEWIRE STR DUAL SENSOR (WIRE) ×2 IMPLANT
GUIDEWIRE ZIPWRE .038 STRAIGHT (WIRE) ×2 IMPLANT
IV NS 1000ML (IV SOLUTION) ×2
IV NS 1000ML BAXH (IV SOLUTION) ×1 IMPLANT
IV NS IRRIG 3000ML ARTHROMATIC (IV SOLUTION) ×2 IMPLANT
KIT TURNOVER CYSTO (KITS) ×2 IMPLANT
MANIFOLD NEPTUNE II (INSTRUMENTS) ×2 IMPLANT
NS IRRIG 500ML POUR BTL (IV SOLUTION) ×2 IMPLANT
PACK CYSTO (CUSTOM PROCEDURE TRAY) ×2 IMPLANT
STENT URET 6FRX24 CONTOUR (STENTS) ×2 IMPLANT
SYR 10ML LL (SYRINGE) ×2 IMPLANT
TUBE CONNECTING 12X1/4 (SUCTIONS) ×2 IMPLANT
TUBE FEEDING 8FR 16IN STR KANG (MISCELLANEOUS) ×2 IMPLANT
TUBING UROLOGY SET (TUBING) ×2 IMPLANT

## 2020-06-11 NOTE — Anesthesia Preprocedure Evaluation (Addendum)
Anesthesia Evaluation  Patient identified by MRN, date of birth, ID band Patient awake    Reviewed: Allergy & Precautions, NPO status , Patient's Chart, lab work & pertinent test results  Airway Mallampati: II  TM Distance: >3 FB Neck ROM: Full    Dental no notable dental hx.    Pulmonary former smoker,  covid + 03/2020 Quit smoking 1995   Pulmonary exam normal breath sounds clear to auscultation       Cardiovascular hypertension, Pt. on medications Normal cardiovascular exam Rhythm:Regular Rate:Normal  Episode of chest pain 2018- stress, echo and cath all normal   Neuro/Psych  Headaches, PSYCHIATRIC DISORDERS Anxiety    GI/Hepatic Neg liver ROS, GERD  Controlled and Medicated,  Endo/Other  negative endocrine ROS  Renal/GU Renal diseaseRenal calculus  negative genitourinary   Musculoskeletal  (+) Arthritis , Rheumatoid disorders,  Ankylosing spondylitis   Abdominal   Peds  Hematology negative hematology ROS (+)   Anesthesia Other Findings   Reproductive/Obstetrics negative OB ROS S/p hysterectomy                            Anesthesia Physical Anesthesia Plan  ASA: II  Anesthesia Plan: General   Post-op Pain Management:    Induction: Intravenous  PONV Risk Score and Plan: 4 or greater and Ondansetron, Dexamethasone, Midazolam and Treatment may vary due to age or medical condition  Airway Management Planned: LMA  Additional Equipment: None  Intra-op Plan:   Post-operative Plan: Extubation in OR  Informed Consent: I have reviewed the patients History and Physical, chart, labs and discussed the procedure including the risks, benefits and alternatives for the proposed anesthesia with the patient or authorized representative who has indicated his/her understanding and acceptance.     Dental advisory given  Plan Discussed with: CRNA  Anesthesia Plan Comments:         Anesthesia Quick Evaluation

## 2020-06-11 NOTE — Op Note (Signed)
Operative Note  Preoperative diagnosis:  1.  Left renal stone  Postoperative diagnosis: 1.  Left renal stone  Procedure(s): 1.  Cystoscopy 2.  Left ureteroscopy with laser lithotripsy and basket traction of stone 3.  Left retrograde pyelogram 4.  Left ureteral stent exchange 5.  Fluoroscopy less than 30 minutes with intraoperative interpretation  Surgeon: Rexene Alberts, MD  Assistants:  None  Anesthesia:  General  Complications:  None  EBL:  minimal  Specimens: 1. * No specimens in log *  Drains/Catheters: 1.  6 French by 24 cm left ureteral stent  Intraoperative findings:   1. No stones identified within the ureter.  Small left upper pole renal stone approximately 3 mm.  An intraparenchymal stone noted within the left lower pole.  Successful fragmentation about extraction of stone.  Cystoscopy placement of left ureteral stent with coil within the renal pelvis and bladder respectively.  Indication:  Anna Wilson is a 49 y.o. female initially presented with left flank pain found to have a possible left ureteral stone.  She was taken back for ureteral stent placement and then had some left stent discomfort.  She was also found to have a left upper pole stone.  After thorough discussion with all relevant risk benefits and alternatives, she presents the operating today for definitive treatment of her left renal stone.  Description of procedure: After informed consent was obtained from the patient, the patient was identified and taken to the operating room and placed in the supine position.  General anesthesia was administered as well as perioperative IV antibiotics.  At the beginning of the case, a time-out was performed to properly identify the patient, the surgery to be performed, and the surgical site.  Sequential compression devices were applied to the lower extremities at the beginning of the case for DVT prophylaxis.  The patient was then placed in the dorsal lithotomy supine  position, prepped and draped in sterile fashion.  Preliminary scout fluoroscopy revealed that there was a small calcification area at the left renal pelvis, which corresponds to the stone found on the preoperative CT scan. We then passed the 21-French rigid cystoscope through the urethra and into the bladder under vision without any difficulty , noting a normal urethra without A systematic evaluation of the bladder revealed no evidence of any suspicious bladder lesions.  Ureteral orifices were in normal position.    The distal aspect of the ureteral stent was seen protruding from the leftureteral orifice.  We then used the alligator-tooth forceps and grasped the distal end of the ureteral stent and brought it out the urethral meatus while watching the proximal coil straighten out nicely on fluoroscopy. Through the ureteral stent, we then passed a 0.038 sensor wire up to the level of the renal pelvis.  The ureteral stent was then removed, leaving the sensor wire up the left ureter.    A semi-rigid ureteroscope was passed alongside the wire up the distal ureter which appeared normal.  We passed this up to the left renal pelvis.  There is no evidence of any ureteral stone.  A second 0.038 zip wire was passed under direct vision and the semirigid scope was removed. The flexible ureteroscope was advanced over the zip wire into the left renal collecting system. The collecting system was inspected. The calculus was identified at the left upper pole. Using the 200 micron holmium laser fiber, the stone was fragmented completely. A 2.2 Fr zero tip basket was used to remove the fragments under visual guidance.  With the ureteroscope in the kidney, a gentle pyelogram was performed to delineate the calyceal system and we evaluated the calyces systematically. We encountered no further stones. The rest of the stone fragments were very tiny and these were  irrigated away gently. The calyces were re-inspected and there were no  significant stone fragment residual.   We then withdrew the ureteroscope back down the ureter, noting no evidence of any stones along the course of the ureter.  Once the ureteroscope was removed, the Glidewire was backloaded through the rigid cystoscope, which was then advanced down the urethra and into the bladder. We then used the Glidewire under direct vision through the rigid cystoscope and under fluoroscopic guidance and passed up a 6-French, 24 cm double-pigtail ureteral stent up ureter, making sure that the proximal and distal ends coiled within the kidney and bladder respectively.  Note that we left a long tether string attached to the distal end of the ureteral stent and it exited the urethral meatus and was placed into the vagina. The cystoscope was then advanced back into the bladder under vision.  We were able to see the distal stent coiling nicely within the bladder.  The bladder was then emptied with irrigation solution.  The cystoscope was then removed.    The patient tolerated the procedure well and there was no complication. Patient was awoken from anesthesia and taken to the recovery room in stable condition. I was present and scrubbed for the entirety of the case.  Plan:  Patient will be discharged home.  She will will remove her ureteral stent in 3 days.  She will follow-up with me in 4 weeks for KUB and renal bladder ultrasound.  Matt R. [facility]  Pager: [phone]

## 2020-06-11 NOTE — Anesthesia Procedure Notes (Signed)
Procedure Name: LMA Insertion Date/Time: 06/11/2020 4:04 PM Performed by: Justice Rocher, CRNA Pre-anesthesia Checklist: Patient identified, Emergency Drugs available, Suction available, Patient being monitored and Timeout performed Patient Re-evaluated:Patient Re-evaluated prior to induction Oxygen Delivery Method: Circle system utilized Preoxygenation: Pre-oxygenation with 100% oxygen Induction Type: IV induction Ventilation: Mask ventilation without difficulty LMA: LMA inserted LMA Size: 4.0 Number of attempts: 1 Airway Equipment and Method: Bite block Placement Confirmation: positive ETCO2,  breath sounds checked- equal and bilateral and CO2 detector Tube secured with: Tape Dental Injury: Teeth and Oropharynx as per pre-operative assessment

## 2020-06-11 NOTE — Discharge Instructions (Signed)
   Activity:  You are encouraged to ambulate frequently (about every hour during waking hours) to help prevent blood clots from forming in your legs or lungs.  However, you should not engage in any heavy lifting (> 10-15 lbs), strenuous activity, or straining.   Diet: You should advance your diet as instructed by your physician.  It will be normal to have some bloating, nausea, and abdominal discomfort intermittently.   Prescriptions:  You will be provided a prescription for pain medication to take as needed.  If your pain is not severe enough to require the prescription pain medication, you may take extra strength Tylenol instead which will have less side effects.  You should also take a prescribed stool softener to avoid straining with bowel movements as the prescription pain medication may constipate you.   What to call us about: You should call the office [phone]) if you develop fever > 101 or develop persistent vomiting. Activity:  You are encouraged to ambulate frequently (about every hour during waking hours) to help prevent blood clots from forming in your legs or lungs.  However, you should not engage in any heavy lifting (> 10-15 lbs), strenuous activity, or straining.  You have a left ureteral stent attached to a string. Remove the stent on Thursday by pulling on string that is in your vagina.       Do not take any Tylenol until after 7:30 pm tonight.  Post Anesthesia Home Care Instructions  Activity: Get plenty of rest for the remainder of the day. A responsible individual must stay with you for 24 hours following the procedure.  For the next 24 hours, DO NOT: -Drive a car -Paediatric nurse -Drink alcoholic beverages -Take any medication unless instructed by your physician -Make any legal decisions or sign important papers.  Meals: Start with liquid foods such as gelatin or soup. Progress to regular foods as tolerated. Avoid greasy, spicy, heavy foods. If nausea  and/or vomiting occur, drink only clear liquids until the nausea and/or vomiting subsides. Call your physician if vomiting continues.  Special Instructions/Symptoms: Your throat may feel dry or sore from the anesthesia or the breathing tube placed in your throat during surgery. If this causes discomfort, gargle with warm salt water. The discomfort should disappear within 24 hours.

## 2020-06-11 NOTE — H&P (Signed)
Anna Wilson  MRN: [id number]  DOB: Sep 24, 1970, 49 year old Female  SSN:    PRIMARY CARE:    REFERRING:    PROVIDER:  Rexene Alberts, M.D.  LOCATION:  [facility] [phone]     --------------------------------------------------------------------------------   CC/HPI: Anna Wilson is a 49 y.o. female who presents with acute left-sided flank pain proximately 1 day. Denies any nausea or emesis. She denies fevers or chills. She denies dysuria or hematuria.     Urinalysis in the office demonstrated 2+ blood, 10-20 red blood cells, no nitrates, no leukocytes. She thinks she is having acute left-sided ureteral stone pain.     She does have a long history of previous urolithiasis. She did have a history of refractory symptoms secondary to approximately 3 mm distal ureteral calculus. She is also had a CT previously demonstrated small bilateral calculi. She has a history of prior ureteroscopy in 2012, 2017 in 2021. All these were associated with small size stones approximately 3 mm.     Her daughter is getting married this weekend and the rehearsal dinner for tomorrow. She is worried that she will have debilitating stone pain this weekend that would make her miss these events.     CT A/P obtained the office on 05/31/2020 reveals calculi demonstrated in the pelvis however is unclear if these calculi are actually within the ureter or phleboliths. Given her left-sided flank pain with history of prior obstruction with very small calculi and her circumstance of her daughter getting married this weekend, we discussed offering cystoscopy, left retrograde pyelogram, left ureteral stent placement for pain management. She understands that there may not be a stone within her ureter however she wishes stent placement today given the circumstances.     ALLERGIES: NSAIDs - Other Reaction, cant take due to history of bleeding ulcer sulfa - Other Reaction, "bleeding"    MEDICATIONS: Urocit-K 10 meq  (1,080 mg) tablet, extended release 2 tablet PO BID  Adderall Xr 30 mg capsule, ext release 24 hr  Amlodipine-Valsartan 10 mg-320 mg tablet  Docusate Sodium 250 mg capsule  Duloxetine Hcl 60 mg capsule,delayed release  Humira 40 mg/0.8 ml syringe kit  Lexapro 10 mg tablet  Multiple Vitamin  Omega 3  Pantoprazole Sodium 40 mg tablet, delayed release  Sumatriptan Succinate 100 mg tablet PRN  Topamax 50 mg tablet     GU PSH: Cystoscopy Insert Stent, Right - 02/25/2020 Hysterectomy Ureteroscopic stone removal, Right - 02/25/2020       PSH Notes: Tummy Tuck-2009   microdiscectomy-2020   laparoscopy for endometriosis-1994   NON-GU PSH: Cholecystectomy (laparoscopic) Hernia Repair Knee Arthroscopy/surgery Lumbar Spine Fusion     GU PMH: Renal calculus - 04/18/2020 Ureteral calculus - 04/18/2020, - 03/09/2020    NON-GU PMH: Acute gastric ulcer with hemorrhage Ankylosing spondylitis of unspecified sites in spine Anxiety Arthritis Attention-deficit hyperactivity disorder, unspecified type Fibromyalgia GERD Hypercholesterolemia Hypertension    FAMILY HISTORY: Asthma - Runs in Family Hypertension - Runs in Family Kidney Stones - Brother    Notes: 1 son; 2 daughters   SOCIAL HISTORY: Marital Status: Married Preferred Language: English Current Smoking Status: Patient does not smoke anymore. Has not smoked since 02/09/1995. Smoked for 7 years.   Tobacco Use Assessment Completed: Used Tobacco in last 30 days? Social Drinker.  Drinks 3 caffeinated drinks per day. Patient's occupation is/was disabled.    REVIEW OF SYSTEMS:    GU Review Female:   Patient denies frequent urination, hard to postpone urination, burning /pain  with urination, get up at night to urinate, leakage of urine, stream starts and stops, trouble starting your stream, have to strain to urinate, and being pregnant.  Gastrointestinal (Upper):   Patient denies nausea, vomiting, and indigestion/ heartburn.   Gastrointestinal (Lower):   Patient denies diarrhea and constipation.  Constitutional:   Patient denies fever, night sweats, weight loss, and fatigue.  Skin:   Patient denies skin rash/ lesion and itching.  Eyes:   Patient denies blurred vision and double vision.  Ears/ Nose/ Throat:   Patient denies sore throat and sinus problems.  Hematologic/Lymphatic:   Patient denies swollen glands and easy bruising.  Cardiovascular:   Patient denies leg swelling and chest pains.  Respiratory:   Patient denies cough and shortness of breath.  Endocrine:   Patient denies excessive thirst.  Musculoskeletal:   Patient denies back pain and joint pain.  Neurological:   Patient denies headaches and dizziness.  Psychologic:   Patient denies depression and anxiety.   VITAL SIGNS:      05/31/2020 01:31 PM  Weight 126 lb / 57.15 kg  Height 64 in / 162.56 cm  BP 114/69 mmHg  Pulse 97 /min  Temperature 98.2 F / 36.7 C  BMI 21.6 kg/m   MULTI-SYSTEM PHYSICAL EXAMINATION:    Constitutional: Well-nourished. No physical deformities. Normally developed. Good grooming.  Respiratory: No labored breathing, no use of accessory muscles.   Cardiovascular: Normal temperature, normal extremity pulses, no swelling, no varicosities.  Gastrointestinal: No mass, no tenderness, no rigidity, non obese abdomen. Mild L CVA tenderness.     Complexity of Data:  Source Of History:  Patient, Medical Record Summary  Records Review:   Previous Doctor Records, Previous Hospital Records, Previous Patient Records  Urine Test Review:   Urinalysis  X-Ray Review: C.T. Abdomen/Pelvis: Reviewed Films. Reviewed Report.     PROCEDURES:         C.T. ABD-Pelv w/o - 17001      Patient confirmed No Neulasta OnPro Device.           Urinalysis w/Scope Dipstick Dipstick Cont'd Micro  Color: Yellow Bilirubin: Neg mg/dL WBC/hpf: 0 - 5/hpf  Appearance: Clear Ketones: Neg mg/dL RBC/hpf: 10 - 20/hpf  Specific Gravity: 1.025 Blood: 2+  ery/uL Bacteria: NS (Not Seen)  pH: 6.0 Protein: Neg mg/dL Cystals: NS (Not Seen)  Glucose: Neg mg/dL Urobilinogen: 0.2 mg/dL Casts: NS (Not Seen)    Nitrites: Neg Trichomonas: Not Present    Leukocyte Esterase: Neg leu/uL Mucous: Not Present      Epithelial Cells: 0 - 5/hpf      Yeast: NS (Not Seen)      Sperm: Not Present    ASSESSMENT:      ICD-10 Details  1 GU:   Ureteral calculus - N20.1   2   Flank Pain - R10.84   3   Renal calculus - N20.0    PLAN:           Orders Labs CULTURE, URINE  X-Rays: C.T. Abdomen/Pelvis Without Contrast  X-Ray Notes: History:  Hematuria: Yes/No  Patient to see MD after exam: Yes/No  Previous exam: CT / IVP/ US/ KUB/ None  When:  Where:  Diabetic: Yes/ No  BUN/ Creatine:  Date of last BUN Creatinine:  Weight in pounds:  Allergy- Contrasts/ Shellfish: Yes/ No  Conflicting diabetic meds: Yes/ No  Oral contrast and instructions given to patient:   Prior Authorization #: NPCR ref # [id number]  Schedule         Document Letter(s):  Created for Patient: Clinical Summary         Notes:   1. Left flank pain, possibly due to distal left ureteral stone. CT A/P 05/31/2020 reveals calculi within her pelvis however difficult to determine whether these calculi are actually in her left ureter. As her daughter is getting married this weekend, she desires left ureteral stent placement so she would not have debilitating pain over the weekend. We will then plan to perform stage cystoscopy, left ureteroscopy, possible laser lithotripsy with stent exchange versus removal.     -The risks, benefits and alternatives of cystoscopy with left JJ stent placement was discussed with the patient. Risks include, but are not limited to: bleeding, urinary tract infection, ureteral injury, ureteral stricture disease, chronic pain, urinary symptoms, bladder injury, stent migration, the need for nephrostomy tube placement, MI, CVA, DVT, PE and  the inherent risks with general anesthesia. The patient voices understanding and wishes to proceed.   Also present are b/l stones L >R. Right sided are punctate and small in right lower pole. Largest on left is 11m. Will discussed staged L sided URS/LL. Will schedule.           Next Appointment:      Next Appointment: 05/31/2020 01:30 PM    Appointment Type: Office Visit Established Patient    Location: [facility] [phone]    Provider: MRexene Alberts M.D.    Reason for Visit: Movingi Kidney stone pain       Signed by MRexene Alberts M.D. on 05/31/20 at 9:33 PM (EDT  Urology Preoperative H&P   Chief Complaint: left flank pain  History of Present Illness: SJoycelin Wilson a 49y.o. female with left renal stone here for cysto, L URS/LL, stent exchange. No fevers or chills.    Past Medical History:  Diagnosis Date  . Ankylosing spondylitis ([facility]    rhemotology--- dr bAmil Amen  treated with humira injection  . Anxiety   . Carpal tunnel syndrome, bilateral   . DDD (degenerative disc disease), lumbosacral   . Frequency of urination   . History of 2019 novel coronavirus disease (COVID-19) 03/24/2020   per pt had mild symptoms that resolved in 10 days and lingering cough resolved few weeks later  . History of chest pain    in epic was referred to cardiology, dr nJohnsie Cancel(note 01-28-2017) by pt's pcp;  previous nuclear stress test negative for ischemia ef 69% (results in care everywhere 12-22-2016) and normal echo results 08-13-2014 in care everywhere;   pt had cardiac cath _0  03-17-2017 (in epic) no angiographic evidence of CAD and LVEF 65%, non-cardiac chest pain  . History of esophageal spasm    per pt hx chest pain from to no be non-cardiac but esophageal spasm and takes protonix which pt stated no spasm's taking protonix  . History of kidney stones   . History of peptic ulcer 2016  . History of recurrent UTIs   . Hyperlipidemia   . Hypertension    followed by pcp   . Migraines    followed by pcp  . OA (osteoarthritis)   . Renal calculus, left   . Wears glasses     Past Surgical History:  Procedure Laterality Date  . ANTERIOR CRUCIATE LIGAMENT (ACL) REVISION Left 03-09-2018  _1    arthroscopy and quadriceps tendon graft  . CHOLECYSTECTOMY OPEN  23235  AND UMBILICIAL HERNIA REPAIR AND ABDOMINOPLASTY  . CYSTOSCOPY  W/ URETERAL STENT PLACEMENT Left 05/31/2020   Procedure: CYSTOSCOPY WITH RETROGRADE PYELOGRAM/URETERAL STENT PLACEMENT;  Surgeon: Janith Lima, MD;  Location: [facility];  Service: Urology;  Laterality: Left;  . CYSTOSCOPY/RETROGRADE/URETEROSCOPY/STONE EXTRACTION WITH BASKET Right 02/25/2020   Procedure: CYSTOSCOPY RETROGRADE/ URETEROSCOPY/STONE EXTRACTION WITH BASKET/URETERAL STENT PLACEMENT ;  Surgeon: Cleon Gustin, MD;  Location: [facility];  Service: Urology;  Laterality: Right;  . CYSTOSCOPY/RETROGRADE/URETEROSCOPY/STONE EXTRACTION WITH BASKET  2012  . KNEE ARTHROSCOPY W/ ACL RECONSTRUCTION Left 2002  . KNEE ARTHROSCOPY W/ SYNOVECTOMY Left 07-15-2018  _0    lysis adhesions  . KNEE SURGERY Left 2004 and 2017   revision scar tissue  . LEFT HEART CATH AND CORONARY ANGIOGRAPHY N/A 03/17/2017   Procedure: LEFT HEART CATH AND CORONARY ANGIOGRAPHY;  Surgeon: Burnell Blanks, MD;  Location: [facility];  Service: Cardiovascular;  Laterality: N/A;  . LUMBAR SPINE SURGERY  07-15-2019  _1    L4--5 disectomy decompression;  L5--S1 laminectomy  . PELVIC LAPAROSCOPY  1992   for endometriosis  . POSTERIOR LUMBAR FUSION  12-20-2019  _2    L4--5 revision laminectomy and fusion  . TOTAL LAPAROSCOPIC HYSTERECTOMY WITH BILATERAL SALPINGO OOPHORECTOMY  02/26/2016    Allergies:  Allergies  Allergen Reactions  . Nsaids Other (See Comments)    Ulcers  . Sulfa Antibiotics Other (See Comments)    Pt states "I bleed from my body orficese when I take sulfa drugs"  . Sulfasalazine Other (See Comments)    Pt states "I bleed from  my body orficese when I take sulfa drugs"    Family History  Problem Relation Age of Onset  . Hypertension Mother   . Hypertension Father   . Hypothyroidism Brother   . Rheum arthritis Daughter   . Asthma Daughter   . Hypothyroidism Brother   . Asthma Son     Social History:  reports that she quit smoking about 26 years ago. Her smoking use included cigarettes. She quit after 6.00 years of use. She has never used smokeless tobacco. She reports current alcohol use. She reports that she does not use drugs.  ROS: A complete review of systems was performed.  All systems are negative except for pertinent findings as noted.  Physical Exam:  Vital signs in last 24 hours: Temp:  [98 F (36.7 C)] 98 F (36.7 C) (11/01 1334) Pulse Rate:  [86] 86 (11/01 1334) Resp:  [18] 18 (11/01 1334) BP: (117)/(81) 117/81 (11/01 1334) SpO2:  [97 %] 97 % (11/01 1334) Weight:  [59.8 kg] 59.8 kg (11/01 1334) Constitutional:  Alert and oriented, No acute distress Cardiovascular: Regular rate and rhythm Respiratory: Normal respiratory effort, Lungs clear bilaterally GI: Abdomen is soft, nontender, nondistended, no abdominal masses GU: No CVA tenderness Lymphatic: No lymphadenopathy Neurologic: Grossly intact, no focal deficits Psychiatric: Normal mood and affect  Laboratory Data:  No results for input(s): WBC, HGB, HCT, PLT in the last 72 hours.  No results for input(s): NA, K, CL, GLUCOSE, BUN, CALCIUM, CREATININE in the last 72 hours.  Invalid input(s): CO3   No results found for this or any previous visit (from the past 24 hour(s)). Recent Results (from the past 240 hour(s))  SARS CORONAVIRUS 2 (TAT 6-24 HRS) Nasopharyngeal Nasopharyngeal Swab     Status: None   Collection Time: 06/07/20 12:03 PM   Specimen: Nasopharyngeal Swab  Result Value Ref Range Status   SARS Coronavirus 2 NEGATIVE NEGATIVE Final    Comment: (NOTE) SARS-CoV-2 target nucleic acids are NOT DETECTED.  The  SARS-CoV-2  RNA is generally detectable in upper and lower respiratory specimens during the acute phase of infection. Negative results do not preclude SARS-CoV-2 infection, do not rule out co-infections with other pathogens, and should not be used as the sole basis for treatment or other patient management decisions. Negative results must be combined with clinical observations, patient history, and epidemiological information. The expected result is Negative.  Fact Sheet for Patients: SugarRoll.be  Fact Sheet for Healthcare Providers: https://www.woods-mathews.com/  This test is not yet approved or cleared by the [place] FDA and  has been authorized for detection and/or diagnosis of SARS-CoV-2 by FDA under an Emergency Use Authorization (EUA). This EUA will remain  in effect (meaning this test can be used) for the duration of the COVID-19 declaration under Se ction 564(b)(1) of the Act, 21 U.S.C. section 360bbb-3(b)(1), unless the authorization is terminated or revoked sooner.  Performed at [facility], [place] [address], [place], [place] [address]     Renal Function: No results for input(s): CREATININE in the last 168 hours. Estimated Creatinine Clearance: 55.6 mL/min (A) (by C-G formula based on SCr of 1.08 mg/dL (H)).  Radiologic Imaging: No results found.  I independently reviewed the above imaging studies.  Assessment and Plan Anna Wilson is a 49 y.o. female with left renal stone here for cysto, L URS/LL.  -The risks, benefits and alternatives of cystoscopy, left URS/LL, JJ stent placement was discussed with the patient.  Risks include, but are not limited to: bleeding, urinary tract infection, ureteral injury, ureteral stricture disease, chronic pain, urinary symptoms, bladder injury, stent migration, the need for nephrostomy tube placement, MI, CVA, DVT, PE and the inherent risks with general anesthesia.  The patient voices  understanding and wishes to proceed.   Matt R. Damica Gravlin MD 06/11/2020, 3:47 PM  [facility] Pager: [phone]): [phone]

## 2020-06-11 NOTE — Transfer of Care (Signed)
Immediate Anesthesia Transfer of Care Note  Patient: Anna Wilson  Procedure(s) Performed: Procedure(s) (LRB): CYSTOSCOPY/RETROGRADE/URETEROSCOPY/ LASER LITHOTRIPSY/ STONE EXTRACTION WITH BASKET/ LEFT STENT EXCHANGE (Left)  Patient Location: PACU  Anesthesia Type: General  Level of Consciousness: awake, sedated, patient cooperative and responds to stimulation  Airway & Oxygen Therapy: Patient Spontanous Breathing and Patient connected to [place] 02 and soft FM   Post-op Assessment: Report given to PACU RN, Post -op Vital signs reviewed and stable and Patient moving all extremities  Post vital signs: Reviewed and stable  Complications: No apparent anesthesia complications

## 2020-06-11 NOTE — Anesthesia Postprocedure Evaluation (Signed)
Anesthesia Post Note  Patient: Anna Wilson  Procedure(s) Performed: CYSTOSCOPY/RETROGRADE/URETEROSCOPY/ LASER LITHOTRIPSY/ STONE EXTRACTION WITH BASKET/ LEFT STENT EXCHANGE (Left )     Patient location during evaluation: Phase II Anesthesia Type: General Level of consciousness: awake and alert, patient cooperative and oriented Pain management: pain level controlled Vital Signs Assessment: post-procedure vital signs reviewed and stable Respiratory status: spontaneous breathing, respiratory function stable and nonlabored ventilation Cardiovascular status: blood pressure returned to baseline and stable Postop Assessment: no apparent nausea or vomiting, able to ambulate and adequate PO intake Anesthetic complications: no   No complications documented.  Last Vitals:  Vitals:   06/11/20 1731 06/11/20 1800  BP:  106/78  Pulse: (!) 103 84  Resp: 15 14  Temp:  36.7 C  SpO2: 97% 99%    Last Pain:  Vitals:   06/11/20 1800  TempSrc: Oral  PainSc: 3                  Shandell Giovanni,E. Leroy Pettway

## 2020-06-12 ENCOUNTER — Encounter (HOSPITAL_BASED_OUTPATIENT_CLINIC_OR_DEPARTMENT_OTHER): Payer: Self-pay | Admitting: Urology

## 2020-06-13 DIAGNOSIS — M25512 Pain in left shoulder: Secondary | ICD-10-CM | POA: Diagnosis not present

## 2020-06-13 DIAGNOSIS — M25511 Pain in right shoulder: Secondary | ICD-10-CM | POA: Diagnosis not present

## 2020-06-13 DIAGNOSIS — M450 Ankylosing spondylitis of multiple sites in spine: Secondary | ICD-10-CM | POA: Diagnosis not present

## 2020-06-13 DIAGNOSIS — M6281 Muscle weakness (generalized): Secondary | ICD-10-CM | POA: Diagnosis not present

## 2020-06-14 DIAGNOSIS — M4326 Fusion of spine, lumbar region: Secondary | ICD-10-CM | POA: Diagnosis not present

## 2020-06-14 DIAGNOSIS — I10 Essential (primary) hypertension: Secondary | ICD-10-CM | POA: Diagnosis not present

## 2020-06-18 DIAGNOSIS — M25512 Pain in left shoulder: Secondary | ICD-10-CM | POA: Diagnosis not present

## 2020-06-18 DIAGNOSIS — M25511 Pain in right shoulder: Secondary | ICD-10-CM | POA: Diagnosis not present

## 2020-06-18 DIAGNOSIS — M450 Ankylosing spondylitis of multiple sites in spine: Secondary | ICD-10-CM | POA: Diagnosis not present

## 2020-06-18 DIAGNOSIS — M6281 Muscle weakness (generalized): Secondary | ICD-10-CM | POA: Diagnosis not present

## 2020-06-20 DIAGNOSIS — M25512 Pain in left shoulder: Secondary | ICD-10-CM | POA: Diagnosis not present

## 2020-06-20 DIAGNOSIS — M450 Ankylosing spondylitis of multiple sites in spine: Secondary | ICD-10-CM | POA: Diagnosis not present

## 2020-06-20 DIAGNOSIS — M6281 Muscle weakness (generalized): Secondary | ICD-10-CM | POA: Diagnosis not present

## 2020-06-20 DIAGNOSIS — M25511 Pain in right shoulder: Secondary | ICD-10-CM | POA: Diagnosis not present

## 2020-06-25 DIAGNOSIS — R269 Unspecified abnormalities of gait and mobility: Secondary | ICD-10-CM | POA: Diagnosis not present

## 2020-06-25 DIAGNOSIS — M6281 Muscle weakness (generalized): Secondary | ICD-10-CM | POA: Diagnosis not present

## 2020-06-25 DIAGNOSIS — M5416 Radiculopathy, lumbar region: Secondary | ICD-10-CM | POA: Diagnosis not present

## 2020-06-25 DIAGNOSIS — M25551 Pain in right hip: Secondary | ICD-10-CM | POA: Diagnosis not present

## 2020-06-28 DIAGNOSIS — M25511 Pain in right shoulder: Secondary | ICD-10-CM | POA: Diagnosis not present

## 2020-06-28 DIAGNOSIS — M450 Ankylosing spondylitis of multiple sites in spine: Secondary | ICD-10-CM | POA: Diagnosis not present

## 2020-06-28 DIAGNOSIS — M6281 Muscle weakness (generalized): Secondary | ICD-10-CM | POA: Diagnosis not present

## 2020-06-28 DIAGNOSIS — M25512 Pain in left shoulder: Secondary | ICD-10-CM | POA: Diagnosis not present

## 2020-07-02 DIAGNOSIS — Z13220 Encounter for screening for lipoid disorders: Secondary | ICD-10-CM | POA: Diagnosis not present

## 2020-07-02 DIAGNOSIS — M5416 Radiculopathy, lumbar region: Secondary | ICD-10-CM | POA: Diagnosis not present

## 2020-07-02 DIAGNOSIS — N951 Menopausal and female climacteric states: Secondary | ICD-10-CM | POA: Diagnosis not present

## 2020-07-02 DIAGNOSIS — G8929 Other chronic pain: Secondary | ICD-10-CM | POA: Diagnosis not present

## 2020-07-02 DIAGNOSIS — Z0000 Encounter for general adult medical examination without abnormal findings: Secondary | ICD-10-CM | POA: Diagnosis not present

## 2020-07-03 DIAGNOSIS — M67911 Unspecified disorder of synovium and tendon, right shoulder: Secondary | ICD-10-CM | POA: Diagnosis not present

## 2020-07-03 DIAGNOSIS — M25512 Pain in left shoulder: Secondary | ICD-10-CM | POA: Diagnosis not present

## 2020-07-03 DIAGNOSIS — M25511 Pain in right shoulder: Secondary | ICD-10-CM | POA: Diagnosis not present

## 2020-07-03 DIAGNOSIS — M67912 Unspecified disorder of synovium and tendon, left shoulder: Secondary | ICD-10-CM | POA: Diagnosis not present

## 2020-07-03 DIAGNOSIS — R269 Unspecified abnormalities of gait and mobility: Secondary | ICD-10-CM | POA: Diagnosis not present

## 2020-07-03 DIAGNOSIS — M6281 Muscle weakness (generalized): Secondary | ICD-10-CM | POA: Diagnosis not present

## 2020-07-03 DIAGNOSIS — M5416 Radiculopathy, lumbar region: Secondary | ICD-10-CM | POA: Diagnosis not present

## 2020-07-03 DIAGNOSIS — M25551 Pain in right hip: Secondary | ICD-10-CM | POA: Diagnosis not present

## 2020-07-04 DIAGNOSIS — M25551 Pain in right hip: Secondary | ICD-10-CM | POA: Diagnosis not present

## 2020-07-04 DIAGNOSIS — M5416 Radiculopathy, lumbar region: Secondary | ICD-10-CM | POA: Diagnosis not present

## 2020-07-04 DIAGNOSIS — M450 Ankylosing spondylitis of multiple sites in spine: Secondary | ICD-10-CM | POA: Diagnosis not present

## 2020-07-04 DIAGNOSIS — M25511 Pain in right shoulder: Secondary | ICD-10-CM | POA: Diagnosis not present

## 2020-07-04 DIAGNOSIS — M6281 Muscle weakness (generalized): Secondary | ICD-10-CM | POA: Diagnosis not present

## 2020-07-04 DIAGNOSIS — R269 Unspecified abnormalities of gait and mobility: Secondary | ICD-10-CM | POA: Diagnosis not present

## 2020-07-04 DIAGNOSIS — M25512 Pain in left shoulder: Secondary | ICD-10-CM | POA: Diagnosis not present

## 2020-07-08 DIAGNOSIS — Z1212 Encounter for screening for malignant neoplasm of rectum: Secondary | ICD-10-CM | POA: Diagnosis not present

## 2020-07-08 DIAGNOSIS — Z1211 Encounter for screening for malignant neoplasm of colon: Secondary | ICD-10-CM | POA: Diagnosis not present

## 2020-07-09 DIAGNOSIS — M6281 Muscle weakness (generalized): Secondary | ICD-10-CM | POA: Diagnosis not present

## 2020-07-09 DIAGNOSIS — M25551 Pain in right hip: Secondary | ICD-10-CM | POA: Diagnosis not present

## 2020-07-09 DIAGNOSIS — M5416 Radiculopathy, lumbar region: Secondary | ICD-10-CM | POA: Diagnosis not present

## 2020-07-09 DIAGNOSIS — R269 Unspecified abnormalities of gait and mobility: Secondary | ICD-10-CM | POA: Diagnosis not present

## 2020-07-10 DIAGNOSIS — M6281 Muscle weakness (generalized): Secondary | ICD-10-CM | POA: Diagnosis not present

## 2020-07-10 DIAGNOSIS — M25512 Pain in left shoulder: Secondary | ICD-10-CM | POA: Diagnosis not present

## 2020-07-10 DIAGNOSIS — M450 Ankylosing spondylitis of multiple sites in spine: Secondary | ICD-10-CM | POA: Diagnosis not present

## 2020-07-10 DIAGNOSIS — M25511 Pain in right shoulder: Secondary | ICD-10-CM | POA: Diagnosis not present

## 2020-07-11 DIAGNOSIS — M6281 Muscle weakness (generalized): Secondary | ICD-10-CM | POA: Diagnosis not present

## 2020-07-11 DIAGNOSIS — M25551 Pain in right hip: Secondary | ICD-10-CM | POA: Diagnosis not present

## 2020-07-11 DIAGNOSIS — R269 Unspecified abnormalities of gait and mobility: Secondary | ICD-10-CM | POA: Diagnosis not present

## 2020-07-11 DIAGNOSIS — N200 Calculus of kidney: Secondary | ICD-10-CM | POA: Diagnosis not present

## 2020-07-11 DIAGNOSIS — M5416 Radiculopathy, lumbar region: Secondary | ICD-10-CM | POA: Diagnosis not present

## 2020-07-12 DIAGNOSIS — M25512 Pain in left shoulder: Secondary | ICD-10-CM | POA: Diagnosis not present

## 2020-07-12 DIAGNOSIS — M25511 Pain in right shoulder: Secondary | ICD-10-CM | POA: Diagnosis not present

## 2020-07-16 DIAGNOSIS — R269 Unspecified abnormalities of gait and mobility: Secondary | ICD-10-CM | POA: Diagnosis not present

## 2020-07-16 DIAGNOSIS — M5416 Radiculopathy, lumbar region: Secondary | ICD-10-CM | POA: Diagnosis not present

## 2020-07-16 DIAGNOSIS — M6281 Muscle weakness (generalized): Secondary | ICD-10-CM | POA: Diagnosis not present

## 2020-07-16 DIAGNOSIS — M25551 Pain in right hip: Secondary | ICD-10-CM | POA: Diagnosis not present

## 2020-07-18 DIAGNOSIS — M25551 Pain in right hip: Secondary | ICD-10-CM | POA: Diagnosis not present

## 2020-07-18 DIAGNOSIS — M6281 Muscle weakness (generalized): Secondary | ICD-10-CM | POA: Diagnosis not present

## 2020-07-18 DIAGNOSIS — M5416 Radiculopathy, lumbar region: Secondary | ICD-10-CM | POA: Diagnosis not present

## 2020-07-18 DIAGNOSIS — R269 Unspecified abnormalities of gait and mobility: Secondary | ICD-10-CM | POA: Diagnosis not present

## 2020-07-19 DIAGNOSIS — M19211 Secondary osteoarthritis, right shoulder: Secondary | ICD-10-CM | POA: Diagnosis not present

## 2020-07-19 DIAGNOSIS — M19011 Primary osteoarthritis, right shoulder: Secondary | ICD-10-CM | POA: Diagnosis not present

## 2020-07-19 DIAGNOSIS — M6281 Muscle weakness (generalized): Secondary | ICD-10-CM | POA: Diagnosis not present

## 2020-07-19 DIAGNOSIS — M5416 Radiculopathy, lumbar region: Secondary | ICD-10-CM | POA: Diagnosis not present

## 2020-07-19 DIAGNOSIS — Z6820 Body mass index (BMI) 20.0-20.9, adult: Secondary | ICD-10-CM | POA: Diagnosis not present

## 2020-07-19 DIAGNOSIS — G5601 Carpal tunnel syndrome, right upper limb: Secondary | ICD-10-CM | POA: Diagnosis not present

## 2020-07-19 DIAGNOSIS — M19212 Secondary osteoarthritis, left shoulder: Secondary | ICD-10-CM | POA: Diagnosis not present

## 2020-07-19 DIAGNOSIS — M25511 Pain in right shoulder: Secondary | ICD-10-CM | POA: Diagnosis not present

## 2020-07-19 DIAGNOSIS — R269 Unspecified abnormalities of gait and mobility: Secondary | ICD-10-CM | POA: Diagnosis not present

## 2020-07-19 DIAGNOSIS — M4326 Fusion of spine, lumbar region: Secondary | ICD-10-CM | POA: Diagnosis not present

## 2020-07-23 DIAGNOSIS — R269 Unspecified abnormalities of gait and mobility: Secondary | ICD-10-CM | POA: Diagnosis not present

## 2020-07-23 DIAGNOSIS — Z6822 Body mass index (BMI) 22.0-22.9, adult: Secondary | ICD-10-CM | POA: Diagnosis not present

## 2020-07-23 DIAGNOSIS — N941 Unspecified dyspareunia: Secondary | ICD-10-CM | POA: Diagnosis not present

## 2020-07-23 DIAGNOSIS — M5416 Radiculopathy, lumbar region: Secondary | ICD-10-CM | POA: Diagnosis not present

## 2020-07-23 DIAGNOSIS — M25551 Pain in right hip: Secondary | ICD-10-CM | POA: Diagnosis not present

## 2020-07-23 DIAGNOSIS — M6281 Muscle weakness (generalized): Secondary | ICD-10-CM | POA: Diagnosis not present

## 2020-07-26 DIAGNOSIS — R269 Unspecified abnormalities of gait and mobility: Secondary | ICD-10-CM | POA: Diagnosis not present

## 2020-07-26 DIAGNOSIS — M5416 Radiculopathy, lumbar region: Secondary | ICD-10-CM | POA: Diagnosis not present

## 2020-07-26 DIAGNOSIS — M25551 Pain in right hip: Secondary | ICD-10-CM | POA: Diagnosis not present

## 2020-07-26 DIAGNOSIS — M6281 Muscle weakness (generalized): Secondary | ICD-10-CM | POA: Diagnosis not present

## 2020-08-11 HISTORY — PX: OTHER SURGICAL HISTORY: SHX169

## 2020-08-11 HISTORY — PX: SHOULDER ARTHROSCOPY: SHX128

## 2020-11-12 DIAGNOSIS — M25512 Pain in left shoulder: Secondary | ICD-10-CM | POA: Diagnosis not present

## 2020-11-12 DIAGNOSIS — Z4789 Encounter for other orthopedic aftercare: Secondary | ICD-10-CM | POA: Diagnosis not present

## 2020-11-16 DIAGNOSIS — Z6820 Body mass index (BMI) 20.0-20.9, adult: Secondary | ICD-10-CM | POA: Diagnosis not present

## 2020-11-16 DIAGNOSIS — M7072 Other bursitis of hip, left hip: Secondary | ICD-10-CM | POA: Diagnosis not present

## 2020-11-16 DIAGNOSIS — M4326 Fusion of spine, lumbar region: Secondary | ICD-10-CM | POA: Diagnosis not present

## 2020-11-16 DIAGNOSIS — M4726 Other spondylosis with radiculopathy, lumbar region: Secondary | ICD-10-CM | POA: Diagnosis not present

## 2020-11-21 DIAGNOSIS — M6281 Muscle weakness (generalized): Secondary | ICD-10-CM | POA: Diagnosis not present

## 2020-11-21 DIAGNOSIS — Z9889 Other specified postprocedural states: Secondary | ICD-10-CM | POA: Diagnosis not present

## 2020-11-21 DIAGNOSIS — G540 Brachial plexus disorders: Secondary | ICD-10-CM | POA: Diagnosis not present

## 2020-11-24 ENCOUNTER — Emergency Department (HOSPITAL_COMMUNITY): Payer: No Typology Code available for payment source

## 2020-11-24 ENCOUNTER — Encounter (HOSPITAL_COMMUNITY): Payer: Self-pay | Admitting: Emergency Medicine

## 2020-11-24 ENCOUNTER — Other Ambulatory Visit: Payer: Self-pay

## 2020-11-24 ENCOUNTER — Emergency Department (HOSPITAL_COMMUNITY)
Admission: EM | Admit: 2020-11-24 | Discharge: 2020-11-25 | Disposition: A | Discharge status: Home / Self Care | Payer: No Typology Code available for payment source | Attending: Emergency Medicine | Admitting: Emergency Medicine

## 2020-11-24 DIAGNOSIS — G43809 Other migraine, not intractable, without status migrainosus: Secondary | ICD-10-CM

## 2020-11-24 DIAGNOSIS — Z8616 Personal history of COVID-19: Secondary | ICD-10-CM | POA: Diagnosis not present

## 2020-11-24 DIAGNOSIS — M542 Cervicalgia: Secondary | ICD-10-CM

## 2020-11-24 DIAGNOSIS — Z87891 Personal history of nicotine dependence: Secondary | ICD-10-CM | POA: Insufficient documentation

## 2020-11-24 DIAGNOSIS — I10 Essential (primary) hypertension: Secondary | ICD-10-CM | POA: Diagnosis not present

## 2020-11-24 DIAGNOSIS — Z79899 Other long term (current) drug therapy: Secondary | ICD-10-CM | POA: Diagnosis not present

## 2020-11-24 DIAGNOSIS — Z043 Encounter for examination and observation following other accident: Secondary | ICD-10-CM | POA: Diagnosis not present

## 2020-11-24 LAB — CBC WITH DIFFERENTIAL/PLATELET
Abs Immature Granulocytes: 0.14 10*3/uL — ABNORMAL HIGH (ref 0.00–0.07)
Basophils Absolute: 0.1 10*3/uL (ref 0.0–0.1)
Basophils Relative: 1 %
Eosinophils Absolute: 0.1 10*3/uL (ref 0.0–0.5)
Eosinophils Relative: 1 %
HCT: 42.7 % (ref 36.0–46.0)
Hemoglobin: 13.8 g/dL (ref 12.0–15.0)
Immature Granulocytes: 1 %
Lymphocytes Relative: 20 %
Lymphs Abs: 2.5 10*3/uL (ref 0.7–4.0)
MCH: 28.7 pg (ref 26.0–34.0)
MCHC: 32.3 g/dL (ref 30.0–36.0)
MCV: 88.8 fL (ref 80.0–100.0)
Monocytes Absolute: 0.9 10*3/uL (ref 0.1–1.0)
Monocytes Relative: 7 %
Neutro Abs: 8.9 10*3/uL — ABNORMAL HIGH (ref 1.7–7.7)
Neutrophils Relative %: 70 %
Platelets: 412 10*3/uL — ABNORMAL HIGH (ref 150–400)
RBC: 4.81 MIL/uL (ref 3.87–5.11)
RDW: 14.1 % (ref 11.5–15.5)
WBC: 12.6 10*3/uL — ABNORMAL HIGH (ref 4.0–10.5)
nRBC: 0 % (ref 0.0–0.2)

## 2020-11-24 LAB — BASIC METABOLIC PANEL
Anion gap: 9 (ref 5–15)
BUN: 15 mg/dL (ref 6–20)
CO2: 22 mmol/L (ref 22–32)
Calcium: 9.9 mg/dL (ref 8.9–10.3)
Chloride: 110 mmol/L (ref 98–111)
Creatinine, Ser: 1.01 mg/dL — ABNORMAL HIGH (ref 0.44–1.00)
GFR, Estimated: >60 mL/min (ref >60)
Glucose, Bld: 108 mg/dL — ABNORMAL HIGH (ref 70–99)
Potassium: 3.6 mmol/L (ref 3.5–5.1)
Sodium: 141 mmol/L (ref 135–145)

## 2020-11-24 NOTE — ED Triage Notes (Signed)
Pt had R shoulder surgery in January and L shoulder surgery in March.  States she fell going up stairs and then down stairs on 3/27.  Reports neck pain and headache since fall.  Took Prednisone pack without improvement.  Also reports R jaw pain and pressure going into her head when she swallows.  Pain worse when lying on sitting.

## 2020-11-24 NOTE — ED Triage Notes (Signed)
Emergency Medicine Provider Triage Evaluation Note  Anna Wilson , a 50 y.o. female  was evaluated in triage.  Pt complains of R sided neck and jaw pain since fall on 3/27. Prior bilateral shoulders surgery. Taking muscle relaxer and pain medication.  Review of Systems  Positive: Neck pain Negative: fever  Physical Exam  LMP  (LMP Unknown)  Gen:   Awake, no distress   HEENT:  Atraumatic  Resp:  Normal effort  Cardiac:  Normal rate  Abd:   Nondistended, nontender  MSK:   Moves extremities without difficulty  Neuro:  Speech clear   Medical Decision Making  Medically screening exam initiated at 4:54 PM.  Appropriate orders placed.  Anna Wilson was informed that the remainder of the evaluation will be completed by another provider, this initial triage assessment does not replace that evaluation, and the importance of remaining in the ED until their evaluation is complete.  Clinical Impression  Will order CT cervical spine to start due to injury.   Anna Heady, PA-C 11/24/20 1659

## 2020-11-25 MED ORDER — METOCLOPRAMIDE HCL 5 MG/ML IJ SOLN
10.0000 mg | Freq: Once | INTRAMUSCULAR | Status: AC
  Administered 2020-11-25: 10 mg via INTRAVENOUS
  Filled 2020-11-25: qty 2

## 2020-11-25 MED ORDER — KETOROLAC TROMETHAMINE 30 MG/ML IJ SOLN
30.0000 mg | Freq: Once | INTRAMUSCULAR | Status: AC
  Administered 2020-11-25: 30 mg via INTRAVENOUS
  Filled 2020-11-25: qty 1

## 2020-11-25 NOTE — ED Provider Notes (Signed)
[facility] EMERGENCY DEPARTMENT Provider Note  CSN: [id number] Arrival date & time: 11/24/20 1610    History Chief Complaint  Patient presents with  . Neck Pain  . Headache    HPI  Anna Wilson is a 50 y.o. female with history of migraines, low back DDD, ankylosing spondylitis, has had bilateral shoulder/carpal tunnel surgeries in recent weeks. On 3/27 she was walking up the stairs at her house when she stumbled, fell forward then tumbled down the stairs. She did not have any LOC but noted increased neck and shoulder pains afterwards. She was seen at her orthopedists office where shoulder imaging was negative. She was also seen at her spine doctor's office where they thought her continued pain was secondary to massage she was getting as part of her post-op PT. Patient reports her shoulder pain has improved but she continues to have moderate aching R sided neck pain and R sided migraine symptoms. She has completed a course of steroids about a week ago and is continuing to take tizanidine and norco without improvement. She states when she is lying on her left side it feels like a water balloon is pressing on her neck. Pain is not associated with fever, nausea or vomiting. She has not had numbness tingling in arms or legs.    Past Medical History:  Diagnosis Date  . Ankylosing spondylitis [facility])    rhemotology--- dr Amil Amen,  treated with humira injection  . Anxiety   . Carpal tunnel syndrome, bilateral   . DDD (degenerative disc disease), lumbosacral   . Frequency of urination   . History of 2019 novel coronavirus disease (COVID-19) 03/24/2020   per pt had mild symptoms that resolved in 10 days and lingering cough resolved few weeks later  . History of chest pain    in epic was referred to cardiology, dr Johnsie Cancel (note 01-28-2017) by pt's pcp;  previous nuclear stress test negative for ischemia ef 69% (results in care everywhere 12-22-2016) and normal echo results 08-13-2014 in care  everywhere;   pt had cardiac cath @[facility]  03-17-2017 (in epic) no angiographic evidence of CAD and LVEF 65%, non-cardiac chest pain  . History of esophageal spasm    per pt hx chest pain from to no be non-cardiac but esophageal spasm and takes protonix which pt stated no spasm's taking protonix  . History of kidney stones   . History of peptic ulcer 2016  . History of recurrent UTIs   . Hyperlipidemia   . Hypertension    followed by pcp  . Migraines    followed by pcp  . OA (osteoarthritis)   . Renal calculus, left   . Wears glasses     Past Surgical History:  Procedure Laterality Date  . ANTERIOR CRUCIATE LIGAMENT (ACL) REVISION Left 03-09-2018  @[facility]    arthroscopy and quadriceps tendon graft  . CHOLECYSTECTOMY OPEN  8127   AND UMBILICIAL HERNIA REPAIR AND ABDOMINOPLASTY  . CYSTOSCOPY W/ URETERAL STENT PLACEMENT Left 05/31/2020   Procedure: CYSTOSCOPY WITH RETROGRADE PYELOGRAM/URETERAL STENT PLACEMENT;  Surgeon: Janith Lima, MD;  Location: [facility];  Service: Urology;  Laterality: Left;  . CYSTOSCOPY/RETROGRADE/URETEROSCOPY/STONE EXTRACTION WITH BASKET Right 02/25/2020   Procedure: CYSTOSCOPY RETROGRADE/ URETEROSCOPY/STONE EXTRACTION WITH BASKET/URETERAL STENT PLACEMENT ;  Surgeon: Cleon Gustin, MD;  Location: [facility];  Service: Urology;  Laterality: Right;  . CYSTOSCOPY/RETROGRADE/URETEROSCOPY/STONE EXTRACTION WITH BASKET  2012  . CYSTOSCOPY/RETROGRADE/URETEROSCOPY/STONE EXTRACTION WITH BASKET Left 06/11/2020   Procedure: CYSTOSCOPY/RETROGRADE/URETEROSCOPY/ LASER LITHOTRIPSY/ STONE EXTRACTION WITH BASKET/ LEFT STENT EXCHANGE;  Surgeon: Janith Lima, MD;  Location: [facility];  Service: Urology;  Laterality: Left;  ONLY NEEDS 45 MIN  . KNEE ARTHROSCOPY W/ ACL RECONSTRUCTION Left 2002  . KNEE ARTHROSCOPY W/ SYNOVECTOMY Left 07-15-2018  @[facility]    lysis adhesions  . KNEE SURGERY Left 2004 and 2017   revision scar tissue  . LEFT HEART CATH AND CORONARY ANGIOGRAPHY N/A  03/17/2017   Procedure: LEFT HEART CATH AND CORONARY ANGIOGRAPHY;  Surgeon: Burnell Blanks, MD;  Location: [facility];  Service: Cardiovascular;  Laterality: N/A;  . LUMBAR SPINE SURGERY  07-15-2019  @[facility]    L4--5 disectomy decompression;  L5--S1 laminectomy  . PELVIC LAPAROSCOPY  1992   for endometriosis  . POSTERIOR LUMBAR FUSION  12-20-2019  @[facility]    L4--5 revision laminectomy and fusion  . TOTAL LAPAROSCOPIC HYSTERECTOMY WITH BILATERAL SALPINGO OOPHORECTOMY  02/26/2016    Family History  Problem Relation Age of Onset  . Hypertension Mother   . Hypertension Father   . Hypothyroidism Brother   . Rheum arthritis Daughter   . Asthma Daughter   . Hypothyroidism Brother   . Asthma Son     Social History   Tobacco Use  . Smoking status: Former Smoker    Years: 6.00    Types: Cigarettes    Quit date: 06/07/1994    Years since quitting: 26.4  . Smokeless tobacco: Never Used  Vaping Use  . Vaping Use: Never used  Substance Use Topics  . Alcohol use: Yes    Comment: occasional  . Drug use: No     Home Medications Prior to Admission medications   Medication Sig Start Date End Date Taking? Authorizing Provider  Adalimumab 40 MG/0.8ML PNKT Inject 40 mg into the skin every 14 (fourteen) days.    Yes [provider]  ADDERALL XR 20 MG 24 hr capsule Take 20 mg by mouth daily.  04/18/20  Yes [provider]  amLODipine-valsartan (EXFORGE) 10-320 MG tablet Take 1 tablet by mouth daily.  01/26/20  Yes [provider]  docusate sodium (COLACE) 250 MG capsule Take 250 mg by mouth at bedtime.   Yes [provider]  DULoxetine (CYMBALTA) 60 MG capsule Take 60 mg by mouth daily. 01/22/20  Yes [provider]  escitalopram (LEXAPRO) 20 MG tablet Take 20 mg by mouth daily. 04/27/20  Yes [provider]  Multiple Vitamin (MULTIVITAMIN ADULT PO) Take 1 tablet by mouth at bedtime.    Yes [provider]  Omega-3 1000 MG  CAPS Take 1,000 mg by mouth at bedtime.    Yes [provider]  Ondansetron HCl (ZOFRAN PO) Take 1 tablet by mouth every 8 (eight) hours as needed (nausea/vomiting).   Yes [provider]  oxyCODONE-acetaminophen (PERCOCET) 5-325 MG tablet Take 1 tablet by mouth every 4 (four) hours as needed for up to 6 doses for severe pain. 06/11/20  Yes Janith Lima, MD  pantoprazole (PROTONIX) 40 MG tablet Take 40 mg by mouth 2 (two) times daily.   Yes [provider]  phenazopyridine (PYRIDIUM) 95 MG tablet Take 95 mg by mouth 3 (three) times daily as needed for pain. AZO brand   Yes [provider]  pravastatin (PRAVACHOL) 20 MG tablet Take 20 mg by mouth daily.   Yes [provider]  Probiotic Product (PROBIOTIC DAILY PO) Take 1 capsule by mouth daily. 62 billion   Yes [provider]  Red Yeast Rice Extract 600 MG CAPS Take 600 mg  by mouth at bedtime.   Yes [provider]  SUMAtriptan (IMITREX) 100 MG tablet Take 100 mg by mouth every 2 (two) hours as needed for migraine or headache.  05/26/16  Yes [provider]  topiramate (TOPAMAX) 50 MG tablet Take 50 mg by mouth 2 (two) times daily. 12/14/19  Yes [provider]  Turmeric (QC TUMERIC COMPLEX PO) Take 144 mg by mouth daily.   Yes [provider]     Allergies    Nsaids, Sulfa antibiotics, and Sulfasalazine   Review of Systems   Review of Systems A comprehensive review of systems was completed and negative except as noted in HPI.    Physical Exam BP 140/89 (BP Location: Right Arm)   Pulse 82   Temp 98.1 F (36.7 C) (Oral)   Resp 19   LMP  (LMP Unknown)   SpO2 100%   Physical Exam Vitals and nursing note reviewed.  Constitutional:      Appearance: Normal appearance.  HENT:     Head: Normocephalic and atraumatic.     Nose: Nose normal.     Mouth/Throat:     Mouth: Mucous membranes are moist.  Eyes:     Extraocular Movements: Extraocular  movements intact.     Conjunctiva/sclera: Conjunctivae normal.  Cardiovascular:     Rate and Rhythm: Normal rate.  Pulmonary:     Effort: Pulmonary effort is normal.     Breath sounds: Normal breath sounds.  Abdominal:     General: Abdomen is flat.     Palpations: Abdomen is soft.     Tenderness: There is no abdominal tenderness.  Musculoskeletal:        General: No swelling. Normal range of motion.     Cervical back: Neck supple. Tenderness (R paraspinal cervical area just under the occiput) present.  Skin:    General: Skin is warm and dry.  Neurological:     General: No focal deficit present.     Mental Status: She is alert.     Cranial Nerves: No cranial nerve deficit.     Sensory: No sensory deficit.     Motor: No weakness.  Psychiatric:        Mood and Affect: Mood normal.      ED Results / Procedures / Treatments   Labs (all labs ordered are listed, but only abnormal results are displayed) Labs Reviewed  BASIC METABOLIC PANEL - Abnormal; Notable for the following components:      Result Value   Glucose, Bld 108 (*)    Creatinine, Ser 1.01 (*)    All other components within normal limits  CBC WITH DIFFERENTIAL/PLATELET - Abnormal; Notable for the following components:   WBC 12.6 (*)    Platelets 412 (*)    Neutro Abs 8.9 (*)    Abs Immature Granulocytes 0.14 (*)    All other components within normal limits    EKG None  Radiology CT Cervical Spine Wo Contrast  Result Date: 11/24/2020 CLINICAL DATA:  Right shoulder surgery in January left shoulder surgery in March. Status post fall EXAM: CT CERVICAL SPINE WITHOUT CONTRAST TECHNIQUE: Multidetector CT imaging of the cervical spine was performed without intravenous contrast. Multiplanar CT image reconstructions were also generated. COMPARISON:  X-ray cervical spine 05/29/2016 FINDINGS: Alignment: Straightening of the normal cervical lordosis. Skull base and vertebrae: No acute fracture. No aggressive appearing focal  osseous lesion or focal pathologic process. Soft tissues and spinal canal: No prevertebral fluid or swelling. No visible canal hematoma.  Upper chest: Unremarkable. Other: None. IMPRESSION: No acute displaced fracture or traumatic listhesis of the cervical spine. Electronically Signed   By: Iven Finn M.D.   On: 11/24/2020 18:38    Procedures Procedures  Medications Ordered in the ED Medications  ketorolac (TORADOL) 30 MG/ML injection 30 mg (30 mg Intravenous Given 11/25/20 0118)  metoCLOPramide (REGLAN) injection 10 mg (10 mg Intravenous Given 11/25/20 0118)     MDM Rules/Calculators/A&P MDM Labs and CT ordered in triage are negative. She is having what sounds like MSK neck pain and an exacerbation of her migraine. Will give a dose of Toradol/Reglan for both and reassess for improvement.  ED Course  I have reviewed the triage vital signs and the nursing notes.  Pertinent labs & imaging results that were available during my care of the patient were reviewed by me and considered in my medical decision making (see chart for details).  Clinical Course as of 11/25/20 0201  Sun Nov 25, 2020  0158 Patient reports neck pain and headache improved after Toradol/Reglan. Suspect her symptoms are mostly due to migraine, maybe some residual neck muscle spasm, but no signs of acute bony spine or cord injury. Plan discharge with outpatient follow up.  [CS]    Clinical Course User Index [CS] Truddie Hidden, MD    Final Clinical Impression(s) / ED Diagnoses Final diagnoses:  Other migraine without status migrainosus, not intractable  Neck pain    Rx / DC Orders ED Discharge Orders    None       Truddie Hidden, MD 11/25/20 0201

## 2020-11-26 DIAGNOSIS — M6281 Muscle weakness (generalized): Secondary | ICD-10-CM | POA: Diagnosis not present

## 2020-11-26 DIAGNOSIS — Z9889 Other specified postprocedural states: Secondary | ICD-10-CM | POA: Diagnosis not present

## 2020-11-26 DIAGNOSIS — G540 Brachial plexus disorders: Secondary | ICD-10-CM | POA: Diagnosis not present

## 2020-11-27 DIAGNOSIS — G43909 Migraine, unspecified, not intractable, without status migrainosus: Secondary | ICD-10-CM | POA: Diagnosis not present

## 2020-11-27 DIAGNOSIS — N3000 Acute cystitis without hematuria: Secondary | ICD-10-CM | POA: Diagnosis not present

## 2020-11-27 DIAGNOSIS — Z6823 Body mass index (BMI) 23.0-23.9, adult: Secondary | ICD-10-CM | POA: Diagnosis not present

## 2020-11-28 DIAGNOSIS — G540 Brachial plexus disorders: Secondary | ICD-10-CM | POA: Diagnosis not present

## 2020-11-28 DIAGNOSIS — M6281 Muscle weakness (generalized): Secondary | ICD-10-CM | POA: Diagnosis not present

## 2020-11-28 DIAGNOSIS — Z9889 Other specified postprocedural states: Secondary | ICD-10-CM | POA: Diagnosis not present

## 2020-11-29 ENCOUNTER — Encounter (HOSPITAL_COMMUNITY): Payer: Self-pay

## 2020-11-29 ENCOUNTER — Emergency Department (HOSPITAL_COMMUNITY): Payer: No Typology Code available for payment source

## 2020-11-29 ENCOUNTER — Inpatient Hospital Stay (HOSPITAL_COMMUNITY)
Admission: EM | Admit: 2020-11-29 | Discharge: 2020-12-01 | DRG: 872 | Disposition: A | Discharge status: Home / Self Care | Payer: No Typology Code available for payment source | Attending: Internal Medicine | Admitting: Internal Medicine

## 2020-11-29 DIAGNOSIS — E785 Hyperlipidemia, unspecified: Secondary | ICD-10-CM | POA: Diagnosis not present

## 2020-11-29 DIAGNOSIS — E872 Acidosis: Secondary | ICD-10-CM | POA: Diagnosis not present

## 2020-11-29 DIAGNOSIS — I700 Atherosclerosis of aorta: Secondary | ICD-10-CM | POA: Diagnosis not present

## 2020-11-29 DIAGNOSIS — Z87891 Personal history of nicotine dependence: Secondary | ICD-10-CM

## 2020-11-29 DIAGNOSIS — N281 Cyst of kidney, acquired: Secondary | ICD-10-CM | POA: Diagnosis not present

## 2020-11-29 DIAGNOSIS — Z8616 Personal history of COVID-19: Secondary | ICD-10-CM

## 2020-11-29 DIAGNOSIS — N179 Acute kidney failure, unspecified: Secondary | ICD-10-CM | POA: Diagnosis present

## 2020-11-29 DIAGNOSIS — Z8711 Personal history of peptic ulcer disease: Secondary | ICD-10-CM

## 2020-11-29 DIAGNOSIS — Z20822 Contact with and (suspected) exposure to covid-19: Secondary | ICD-10-CM | POA: Diagnosis present

## 2020-11-29 DIAGNOSIS — F32A Depression, unspecified: Secondary | ICD-10-CM | POA: Diagnosis present

## 2020-11-29 DIAGNOSIS — Z882 Allergy status to sulfonamides status: Secondary | ICD-10-CM

## 2020-11-29 DIAGNOSIS — Z87442 Personal history of urinary calculi: Secondary | ICD-10-CM

## 2020-11-29 DIAGNOSIS — R519 Headache, unspecified: Secondary | ICD-10-CM | POA: Diagnosis not present

## 2020-11-29 DIAGNOSIS — K219 Gastro-esophageal reflux disease without esophagitis: Secondary | ICD-10-CM | POA: Diagnosis not present

## 2020-11-29 DIAGNOSIS — E860 Dehydration: Secondary | ICD-10-CM | POA: Diagnosis not present

## 2020-11-29 DIAGNOSIS — G43909 Migraine, unspecified, not intractable, without status migrainosus: Secondary | ICD-10-CM | POA: Diagnosis present

## 2020-11-29 DIAGNOSIS — I10 Essential (primary) hypertension: Secondary | ICD-10-CM | POA: Diagnosis present

## 2020-11-29 DIAGNOSIS — N200 Calculus of kidney: Secondary | ICD-10-CM | POA: Diagnosis not present

## 2020-11-29 DIAGNOSIS — R55 Syncope and collapse: Secondary | ICD-10-CM | POA: Diagnosis not present

## 2020-11-29 DIAGNOSIS — N3000 Acute cystitis without hematuria: Secondary | ICD-10-CM | POA: Diagnosis not present

## 2020-11-29 DIAGNOSIS — Z8249 Family history of ischemic heart disease and other diseases of the circulatory system: Secondary | ICD-10-CM

## 2020-11-29 DIAGNOSIS — D649 Anemia, unspecified: Secondary | ICD-10-CM | POA: Diagnosis present

## 2020-11-29 DIAGNOSIS — E876 Hypokalemia: Secondary | ICD-10-CM | POA: Diagnosis not present

## 2020-11-29 DIAGNOSIS — I959 Hypotension, unspecified: Secondary | ICD-10-CM

## 2020-11-29 DIAGNOSIS — M5137 Other intervertebral disc degeneration, lumbosacral region: Secondary | ICD-10-CM | POA: Diagnosis present

## 2020-11-29 DIAGNOSIS — R6510 Systemic inflammatory response syndrome (SIRS) of non-infectious origin without acute organ dysfunction: Secondary | ICD-10-CM

## 2020-11-29 DIAGNOSIS — R42 Dizziness and giddiness: Secondary | ICD-10-CM | POA: Diagnosis not present

## 2020-11-29 DIAGNOSIS — A419 Sepsis, unspecified organism: Principal | ICD-10-CM | POA: Diagnosis present

## 2020-11-29 DIAGNOSIS — F419 Anxiety disorder, unspecified: Secondary | ICD-10-CM | POA: Diagnosis not present

## 2020-11-29 DIAGNOSIS — Z886 Allergy status to analgesic agent status: Secondary | ICD-10-CM

## 2020-11-29 DIAGNOSIS — Z87440 Personal history of urinary (tract) infections: Secondary | ICD-10-CM | POA: Diagnosis not present

## 2020-11-29 DIAGNOSIS — I951 Orthostatic hypotension: Secondary | ICD-10-CM | POA: Diagnosis present

## 2020-11-29 DIAGNOSIS — F411 Generalized anxiety disorder: Secondary | ICD-10-CM | POA: Diagnosis not present

## 2020-11-29 DIAGNOSIS — N390 Urinary tract infection, site not specified: Secondary | ICD-10-CM | POA: Diagnosis not present

## 2020-11-29 DIAGNOSIS — M459 Ankylosing spondylitis of unspecified sites in spine: Secondary | ICD-10-CM | POA: Diagnosis not present

## 2020-11-29 DIAGNOSIS — Z79899 Other long term (current) drug therapy: Secondary | ICD-10-CM

## 2020-11-29 LAB — CBC WITH DIFFERENTIAL/PLATELET
Abs Immature Granulocytes: 0.04 10*3/uL (ref 0.00–0.07)
Basophils Absolute: 0.1 10*3/uL (ref 0.0–0.1)
Basophils Relative: 1 %
Eosinophils Absolute: 0.2 10*3/uL (ref 0.0–0.5)
Eosinophils Relative: 2 %
HCT: 38.3 % (ref 36.0–46.0)
Hemoglobin: 11.9 g/dL — ABNORMAL LOW (ref 12.0–15.0)
Immature Granulocytes: 1 %
Lymphocytes Relative: 29 %
Lymphs Abs: 2.3 10*3/uL (ref 0.7–4.0)
MCH: 28.1 pg (ref 26.0–34.0)
MCHC: 31.1 g/dL (ref 30.0–36.0)
MCV: 90.5 fL (ref 80.0–100.0)
Monocytes Absolute: 0.5 10*3/uL (ref 0.1–1.0)
Monocytes Relative: 7 %
Neutro Abs: 4.8 10*3/uL (ref 1.7–7.7)
Neutrophils Relative %: 60 %
Platelets: 328 10*3/uL (ref 150–400)
RBC: 4.23 MIL/uL (ref 3.87–5.11)
RDW: 14.4 % (ref 11.5–15.5)
WBC: 7.8 10*3/uL (ref 4.0–10.5)
nRBC: 0 % (ref 0.0–0.2)

## 2020-11-29 LAB — URINALYSIS, ROUTINE W REFLEX MICROSCOPIC
Bilirubin Urine: NEGATIVE
Glucose, UA: NEGATIVE mg/dL
Hgb urine dipstick: NEGATIVE
Ketones, ur: NEGATIVE mg/dL
Nitrite: NEGATIVE
Protein, ur: 30 mg/dL — AB
Specific Gravity, Urine: 1.011 (ref 1.005–1.030)
pH: 5 (ref 5.0–8.0)

## 2020-11-29 LAB — COMPREHENSIVE METABOLIC PANEL
ALT: 11 U/L (ref 0–44)
AST: 14 U/L — ABNORMAL LOW (ref 15–41)
Albumin: 4 g/dL (ref 3.5–5.0)
Alkaline Phosphatase: 57 U/L (ref 38–126)
Anion gap: 10 (ref 5–15)
BUN: 31 mg/dL — ABNORMAL HIGH (ref 6–20)
CO2: 21 mmol/L — ABNORMAL LOW (ref 22–32)
Calcium: 9.9 mg/dL (ref 8.9–10.3)
Chloride: 108 mmol/L (ref 98–111)
Creatinine, Ser: 2.37 mg/dL — ABNORMAL HIGH (ref 0.44–1.00)
GFR, Estimated: 25 mL/min — ABNORMAL LOW (ref >60)
Glucose, Bld: 96 mg/dL (ref 70–99)
Potassium: 4.3 mmol/L (ref 3.5–5.1)
Sodium: 139 mmol/L (ref 135–145)
Total Bilirubin: 0.4 mg/dL (ref 0.3–1.2)
Total Protein: 7.1 g/dL (ref 6.5–8.1)

## 2020-11-29 LAB — TSH: TSH: 2.218 u[IU]/mL (ref 0.350–4.500)

## 2020-11-29 LAB — LACTIC ACID, PLASMA: Lactic Acid, Venous: 1 mmol/L (ref 0.5–1.9)

## 2020-11-29 LAB — LIPASE, BLOOD: Lipase: 49 U/L (ref 11–51)

## 2020-11-29 LAB — TROPONIN I (HIGH SENSITIVITY): Troponin I (High Sensitivity): <2 ng/L (ref <18)

## 2020-11-29 MED ORDER — LACTATED RINGERS IV BOLUS
1000.0000 mL | Freq: Once | INTRAVENOUS | Status: AC
  Administered 2020-11-29: 1000 mL via INTRAVENOUS

## 2020-11-29 MED ORDER — ONDANSETRON HCL 4 MG/2ML IJ SOLN
4.0000 mg | Freq: Once | INTRAMUSCULAR | Status: AC
  Administered 2020-11-29: 4 mg via INTRAVENOUS
  Filled 2020-11-29: qty 2

## 2020-11-29 MED ORDER — SODIUM CHLORIDE 0.9 % IV SOLN
1.0000 g | Freq: Once | INTRAVENOUS | Status: AC
  Administered 2020-11-29: 1 g via INTRAVENOUS
  Filled 2020-11-29: qty 10

## 2020-11-29 MED ORDER — ACETAMINOPHEN 325 MG PO TABS
650.0000 mg | ORAL_TABLET | Freq: Once | ORAL | Status: AC
  Administered 2020-11-29: 650 mg via ORAL
  Filled 2020-11-29: qty 2

## 2020-11-29 NOTE — H&P (Signed)
History and Physical    PLEASE NOTE THAT DRAGON DICTATION SOFTWARE WAS USED IN THE CONSTRUCTION OF THIS NOTE.   Anna Wilson GNF:[id number] DOB: Jan 02, 1971 DOA: 11/29/2020  PCP: Elenore Paddy, NP Patient coming from: home   I have personally briefly reviewed patient's old medical records in [place]  Chief Complaint: Dizziness  HPI: Anna Wilson is a 50 y.o. female with medical history significant for hypertension, hyperlipidemia, recurrent urinary tract infections, generalized anxiety disorder, who is admitted to [facility] on 11/29/2020 with acute cystitis after presenting from home to [facility] ED complaining of dizziness.   The patient reports 1 day of dizziness/lightheadedness, worsening when rising from a seated to standing position.  She reports that she checked her blood pressure at home earlier today, noting the systolic BP to be in the 84O mmHg, which she states is relative to her baseline systolic blood pressures in the 120s while on valsartan/Norvasc.  She denies any recent changes to her home antihypertensive medication regimen, including no additions to this regimen or modifications to the associated doses or frequencies.   She reports that she has been experiencing 4 days of dysuria, leading to diagnosis of urinary tract infection as an outpatient on 11/27/2020, for which she was prescribed ciprofloxacin.  She reports good ensuing compliance with his antibiotic, but noted ensuing development of after mentioned dizziness.  She reports associated decline in appetite resulting in slight decline in oral intake, but denies any associated nausea, vomiting, diarrhea, melena, or hematochezia.  She reports lower abdominal discomfort that is intermittent and crampy in nature, worsening with palpation.  She reports that this abdominal discomfort is very similar in intensity, location, and quality relative to abdominal discomfort that she has experienced at the time of previous urinary  tract infections.  Denies any recent trauma, hemoptysis, lower extremity edema, calf tenderness, or lower extremity erythema.  Denies any recent chest pain, palpitations, diaphoresis, shortness of breath, cough, wheezing, or syncope.  Denies any recent neck stiffness, rhinitis, rhinorrhea, sore throat.  No recent known COVID-19 exposures.     ED Course:  Vital signs in the ED were notable for the following: Temperature max 98.7, heart rate 60-75; initial blood pressure noted to be 87/63, which is improved to 98/68 following IV antibiotics as well as IV fluids, as further described below; respiratory rate 12-19, oxygen saturation 99 to 100% on room air.  Labs were notable for the following: CMP was notable for the following: Sodium 139, potassium 4.3, bicarbonate 21, anion gap 10, BUN 31, creatinine 2.37 relative to most recent prior creatinine data point of 1.01 on 11/24/2020, glucose 96, and liver enzymes were found to be within normal limits.  High-sensitivity troponin I x1 was found to be less than 2.  CBC notable for white blood cell count of 7800, hemoglobin 11.9.  Lactic acid 1.0.  Urinalysis was notable for 21-50 white blood cells, moderate leukocyte esterase, was positive for hyaline cast, and showed 30 protein.  Nasopharyngeal COVID-19 PCR performed in the ED today, with result currently pending.  Blood cultures x2 were collected prior to initiation of any antibiotics.  Presenting EKG showed sinus rhythm with heart rate 65, normal intervals, and no evidence of T wave or ST changes, including no evidence of ST elevation.  Chest x-ray showed no evidence of acute cardiopulmonary process, including no evidence of pneumothorax.  CT abdomen/pelvis without contrast showed bilateral nonobstructive nephrolithiasis without evidence of associated hydronephrosis, and overall showed no evidence of acute intra-abdominal process.  While in the ED, the following were administered: Tylenol 650 mg p.o. x1,  Rocephin 1 g IV x1, and 2 L LR bolus.       Review of Systems: As per HPI otherwise 10 point review of systems negative.   Past Medical History:  Diagnosis Date  . Ankylosing spondylitis [facility])    rhemotology--- dr Amil Amen,  treated with humira injection  . Anxiety   . Carpal tunnel syndrome, bilateral   . DDD (degenerative disc disease), lumbosacral   . Frequency of urination   . History of 2019 novel coronavirus disease (COVID-19) 03/24/2020   per pt had mild symptoms that resolved in 10 days and lingering cough resolved few weeks later  . History of chest pain    in epic was referred to cardiology, dr Johnsie Cancel (note 01-28-2017) by pt's pcp;  previous nuclear stress test negative for ischemia ef 69% (results in care everywhere 12-22-2016) and normal echo results 08-13-2014 in care everywhere;   pt had cardiac cath @[facility]  03-17-2017 (in epic) no angiographic evidence of CAD and LVEF 65%, non-cardiac chest pain  . History of esophageal spasm    per pt hx chest pain from to no be non-cardiac but esophageal spasm and takes protonix which pt stated no spasm's taking protonix  . History of kidney stones   . History of peptic ulcer 2016  . History of recurrent UTIs   . Hyperlipidemia   . Hypertension    followed by pcp  . Migraines    followed by pcp  . OA (osteoarthritis)   . Renal calculus, left   . Wears glasses     Past Surgical History:  Procedure Laterality Date  . ANTERIOR CRUCIATE LIGAMENT (ACL) REVISION Left 03-09-2018  @[facility]    arthroscopy and quadriceps tendon graft  . CHOLECYSTECTOMY OPEN  3664   AND UMBILICIAL HERNIA REPAIR AND ABDOMINOPLASTY  . CYSTOSCOPY W/ URETERAL STENT PLACEMENT Left 05/31/2020   Procedure: CYSTOSCOPY WITH RETROGRADE PYELOGRAM/URETERAL STENT PLACEMENT;  Surgeon: Janith Lima, MD;  Location: [facility];  Service: Urology;  Laterality: Left;  . CYSTOSCOPY/RETROGRADE/URETEROSCOPY/STONE EXTRACTION WITH BASKET Right 02/25/2020   Procedure: CYSTOSCOPY  RETROGRADE/ URETEROSCOPY/STONE EXTRACTION WITH BASKET/URETERAL STENT PLACEMENT ;  Surgeon: Cleon Gustin, MD;  Location: [facility];  Service: Urology;  Laterality: Right;  . CYSTOSCOPY/RETROGRADE/URETEROSCOPY/STONE EXTRACTION WITH BASKET  2012  . CYSTOSCOPY/RETROGRADE/URETEROSCOPY/STONE EXTRACTION WITH BASKET Left 06/11/2020   Procedure: CYSTOSCOPY/RETROGRADE/URETEROSCOPY/ LASER LITHOTRIPSY/ STONE EXTRACTION WITH BASKET/ LEFT STENT EXCHANGE;  Surgeon: Janith Lima, MD;  Location: [facility];  Service: Urology;  Laterality: Left;  ONLY NEEDS 45 MIN  . KNEE ARTHROSCOPY W/ ACL RECONSTRUCTION Left 2002  . KNEE ARTHROSCOPY W/ SYNOVECTOMY Left 07-15-2018  @[facility]    lysis adhesions  . KNEE SURGERY Left 2004 and 2017   revision scar tissue  . LEFT HEART CATH AND CORONARY ANGIOGRAPHY N/A 03/17/2017   Procedure: LEFT HEART CATH AND CORONARY ANGIOGRAPHY;  Surgeon: Burnell Blanks, MD;  Location: [place] [facility];  Service: Cardiovascular;  Laterality: N/A;  . LUMBAR SPINE SURGERY  07-15-2019  @[facility]    L4--5 disectomy decompression;  L5--S1 laminectomy  . PELVIC LAPAROSCOPY  1992   for endometriosis  . POSTERIOR LUMBAR FUSION  12-20-2019  @[facility]    L4--5 revision laminectomy and fusion  . TOTAL LAPAROSCOPIC HYSTERECTOMY WITH BILATERAL SALPINGO OOPHORECTOMY  02/26/2016    Social History:  reports that she quit smoking about 26 years ago. Her smoking use included cigarettes. She quit after 6.00 years of use. She has never  used smokeless tobacco. She reports current alcohol use. She reports that she does not use drugs.   Allergies  Allergen Reactions  . Nsaids Other (See Comments)    Ulcers  . Sulfa Antibiotics Other (See Comments)    Pt states "I bleed from my body orficese when I take sulfa drugs"  . Sulfasalazine Other (See Comments)    Pt states "I bleed from my body orficese when I take sulfa drugs"    Family History  Problem Relation Age of Onset  . Hypertension  Mother   . Hypertension Father   . Hypothyroidism Brother   . Rheum arthritis Daughter   . Asthma Daughter   . Hypothyroidism Brother   . Asthma Son      Prior to Admission medications   Medication Sig Start Date End Date Taking? Authorizing Provider  Adalimumab 40 MG/0.8ML PNKT Inject 40 mg into the skin every 14 (fourteen) days.     [provider]  ADDERALL XR 20 MG 24 hr capsule Take 20 mg by mouth daily.  04/18/20   [provider]  amLODipine-valsartan (EXFORGE) 10-320 MG tablet Take 1 tablet by mouth daily.  01/26/20   [provider]  docusate sodium (COLACE) 250 MG capsule Take 250 mg by mouth at bedtime.    [provider]  DULoxetine (CYMBALTA) 60 MG capsule Take 60 mg by mouth daily. 01/22/20   [provider]  escitalopram (LEXAPRO) 20 MG tablet Take 20 mg by mouth daily. 04/27/20   [provider]  Multiple Vitamin (MULTIVITAMIN ADULT PO) Take 1 tablet by mouth at bedtime.     [provider]  Omega-3 1000 MG CAPS Take 1,000 mg by mouth at bedtime.     [provider]  Ondansetron HCl (ZOFRAN PO) Take 1 tablet by mouth every 8 (eight) hours as needed (nausea/vomiting).    [provider]  oxyCODONE-acetaminophen (PERCOCET) 5-325 MG tablet Take 1 tablet by mouth every 4 (four) hours as needed for up to 6 doses for severe pain. 06/11/20   Janith Lima, MD  pantoprazole (PROTONIX) 40 MG tablet Take 40 mg by mouth 2 (two) times daily.    [provider]  phenazopyridine (PYRIDIUM) 95 MG tablet Take 95 mg by mouth 3 (three) times daily as needed for pain. AZO brand    [provider]  pravastatin (PRAVACHOL) 20 MG tablet Take 20 mg by mouth daily.    [provider]  Probiotic Product (PROBIOTIC DAILY PO) Take 1 capsule by mouth daily. 62 billion    [provider]  Red Yeast Rice Extract 600 MG CAPS Take 600 mg by mouth at bedtime.    [provider]   SUMAtriptan (IMITREX) 100 MG tablet Take 100 mg by mouth every 2 (two) hours as needed for migraine or headache.  05/26/16   [provider]  topiramate (TOPAMAX) 50 MG tablet Take 50 mg by mouth 2 (two) times daily. 12/14/19   [provider]  Turmeric (QC TUMERIC COMPLEX PO) Take 144 mg by mouth daily.    [provider]     Objective    Physical Exam: Vitals:   11/29/20 2130 11/29/20 2200 11/29/20 2230 11/29/20 2345  BP: (!) 89/59 91/62 93/64  95/64  Pulse: 60 64 72 71  Resp: 19 17 15 12   Temp:      TempSrc:      SpO2: 100% 100% 99% 96%  Weight:      Height:  General: appears to be stated age; alert, oriented Skin: warm, dry, no rash Head:  AT/Roe Mouth:  Oral mucosa membranes appear dry, normal dentition Neck: supple; trachea midline Heart:  RRR; did not appreciate any M/R/G Lungs: CTAB, did not appreciate any wheezes, rales, or rhonchi Abdomen: + BS; soft, ND, NT Vascular: 2+ pedal pulses b/l; 2+ radial pulses b/l Extremities: no peripheral edema, no muscle wasting Neuro: strength and sensation intact in upper and lower extremities b/l     Labs on Admission: I have personally reviewed following labs and imaging studies  CBC: Recent Labs  Lab 11/24/20 1712 11/29/20 2057  WBC 12.6* 7.8  NEUTROABS 8.9* 4.8  HGB 13.8 11.9*  HCT 42.7 38.3  MCV 88.8 90.5  PLT 412* 233   Basic Metabolic Panel: Recent Labs  Lab 11/24/20 1712 11/29/20 2057  NA 141 139  K 3.6 4.3  CL 110 108  CO2 22 21*  GLUCOSE 108* 96  BUN 15 31*  CREATININE 1.01* 2.37*  CALCIUM 9.9 9.9   GFR: Estimated Creatinine Clearance: 24.8 mL/min (A) (by C-G formula based on SCr of 2.37 mg/dL (H)). Liver Function Tests: Recent Labs  Lab 11/29/20 2057  AST 14*  ALT 11  ALKPHOS 57  BILITOT 0.4  PROT 7.1  ALBUMIN 4.0   Recent Labs  Lab 11/29/20 2057  LIPASE 49   No results for input(s): AMMONIA in the last 168 hours. Coagulation Profile: No results  for input(s): INR, PROTIME in the last 168 hours. Cardiac Enzymes: No results for input(s): CKTOTAL, CKMB, CKMBINDEX, TROPONINI in the last 168 hours. BNP (last 3 results) No results for input(s): PROBNP in the last 8760 hours. HbA1C: No results for input(s): HGBA1C in the last 72 hours. CBG: No results for input(s): GLUCAP in the last 168 hours. Lipid Profile: No results for input(s): CHOL, HDL, LDLCALC, TRIG, CHOLHDL, LDLDIRECT in the last 72 hours. Thyroid Function Tests: Recent Labs    11/29/20 2125  TSH 2.218   Anemia Panel: No results for input(s): VITAMINB12, FOLATE, FERRITIN, TIBC, IRON, RETICCTPCT in the last 72 hours. Urine analysis:    Component Value Date/Time   COLORURINE YELLOW 11/29/2020 2130   APPEARANCEUR HAZY (A) 11/29/2020 2130   LABSPEC 1.011 11/29/2020 2130   PHURINE 5.0 11/29/2020 2130   GLUCOSEU NEGATIVE 11/29/2020 2130   HGBUR NEGATIVE 11/29/2020 2130   BILIRUBINUR NEGATIVE 11/29/2020 2130   [place] NEGATIVE 11/29/2020 2130   PROTEINUR 30 (A) 11/29/2020 2130   NITRITE NEGATIVE 11/29/2020 2130   LEUKOCYTESUR MODERATE (A) 11/29/2020 2130    Radiological Exams on Admission: CT ABDOMEN PELVIS WO CONTRAST  Result Date: 11/29/2020 CLINICAL DATA:  Left lower quadrant abdominal pain, dizziness, recently diagnosed with UTI EXAM: CT ABDOMEN AND PELVIS WITHOUT CONTRAST TECHNIQUE: Multidetector CT imaging of the abdomen and pelvis was performed following the standard protocol without IV contrast. COMPARISON:  Renal ultrasound 07/11/2020 FINDINGS: Lower chest: Lung bases are clear. Normal heart size. No pericardial effusion. Hepatobiliary: No visible worrisome focal liver abnormality is seen within the limitations of this unenhanced CT. Normal hepatic attenuation. Smooth liver surface contour. Prior cholecystectomy without significant biliary ductal dilatation or visible intraductal gallstone. Pancreas: No pancreatic ductal dilatation or surrounding inflammatory  changes. Spleen: Normal in size. No concerning splenic lesions. Adrenals/Urinary Tract: Normal adrenals. Kidneys are symmetric in size and normally located. Small fluid attenuation parapelvic cyst in the lower pole right kidney measuring approximately 2 cm in size. No concerning visible or contour deforming renal lesions. Bilateral nonobstructive nephrolithiasis. No  frank hydronephrosis or obstructive urolith. Urinary bladder is unremarkable. Stomach/Bowel: Distal esophagus, stomach and duodenal sweep are unremarkable. No small bowel wall thickening or dilatation. No evidence of obstruction. A normal appendix is visualized. No colonic dilatation or wall thickening. Vascular/Lymphatic: Atherosclerotic calcifications within the abdominal aorta and branch vessels. No aneurysm or ectasia. No enlarged abdominopelvic lymph nodes. Reproductive: Urinary bladder is unremarkable. Other: No abdominal wall hernia or abnormality. No abdominopelvic ascites. Musculoskeletal: L4-5 PLIF without acute hardware complication. No acute osseous abnormality or suspicious osseous lesion. Musculature is normal and symmetric. IMPRESSION: 1. No acute intra-abdominal process to provide cause for patient's symptoms. 2. Bilateral nonobstructive nephrolithiasis. No obstructive uropathy, hydronephrosis or other acute urinary tract abnormality. 3. Prior cholecystectomy. 4. L4-5 PLIF without acute hardware complication. 5. Aortic Atherosclerosis (ICD10-I70.0). Electronically Signed   By: Lovena Le M.D.   On: 11/29/2020 23:19   CT Head Wo Contrast  Result Date: 11/29/2020 CLINICAL DATA:  Headaches and dizziness EXAM: CT HEAD WITHOUT CONTRAST TECHNIQUE: Contiguous axial images were obtained from the base of the skull through the vertex without intravenous contrast. COMPARISON:  01/05/2019 FINDINGS: Brain: No evidence of acute infarction, hemorrhage, hydrocephalus, extra-axial collection or mass lesion/mass effect. Vascular: No hyperdense vessel  or unexpected calcification. Skull: Normal. Negative for fracture or focal lesion. Sinuses/Orbits: No acute finding. Other: None. IMPRESSION: No acute intracranial abnormality noted. Electronically Signed   By: Inez Catalina M.D.   On: 11/29/2020 23:15   DG Chest Portable 1 View  Result Date: 11/29/2020 CLINICAL DATA:  Recent syncopal episode EXAM: PORTABLE CHEST 1 VIEW COMPARISON:  01/05/2019 FINDINGS: The heart size and mediastinal contours are within normal limits. Both lungs are clear. The visualized skeletal structures are unremarkable. IMPRESSION: No active disease. Electronically Signed   By: Inez Catalina M.D.   On: 11/29/2020 21:45     EKG: Independently reviewed, with result as described above.    Assessment/Plan    Jasiel Apachito is a 50 y.o. female with medical history significant for hypertension, hyperlipidemia, recurrent urinary tract infections, generalized anxiety disorder, who is admitted to [facility] on 11/29/2020 with acute cystitis after presenting from home to [facility] ED complaining of dizziness.    Principal Problem:   Acute cystitis Active Problems:   GERD (gastroesophageal reflux disease)   Hyperlipidemia   Hypotension   AKI (acute kidney injury) ([place])   Anxiety    #) Urinary tract infection: Diagnosis on the basis of 4 days of dysuria, with persistence of symptoms in spite of 2 days of outpatient ciprofloxacin, with presenting urinalysis still demonstrating evidence of significant pyuria.  Of note, patient does not meet SIRS criteria to be considered septic at this time.  CT abdomen/pelvis showed no evidence of pyelonephritis.  Consequently, patient's urinary tract infection appears most consistent with acute cystitis.  Continue IV fluids, as further described below.  Blood cultures x2 were collected prior to initiation of Rocephin in the ED.   Plan: IV fluids, as further described below.  Monitor for results blood cultures x2 collected in the ED today prior to  initiation of antibiotics.  Add on urine culture.  Continue Rocephin that was initiated in the ED today.  Hold home ciprofloxacin.  Repeat CBC with differential in the morning.      #) Hypotension: Improving with IV fluids.  Initial blood pressure noted to be in the systolic 56L, relative to patient's baseline in the systolic 875I mmHg.Marland Kitchen  Blood pressure now in the high 90s, with MAP greater than 65 following  initiation of Rocephin as well as 2 L of LR bolus.  Suspect contribution from intravascular depletion due to mild dehydration as a result of patient's report of recent decline in oral intake as well as a potential contribution from acute cystitis.  No evidence of additional underlying infectious process at this time, including chest x-ray showing no evidence of pneumonia.  Of note, screening nasopharyngeal COVID-19 PCR checked in the ED today is currently pending.  Clinically, presentation is much less suggestive of acute pulmonary embolism.  ACS is also felt to be less likely, in the absence of any chest pain, as well as presenting EKG showing no evidence of acute ischemic changes, while high-sensitivity troponin IV x1 was found to be nonelevated.  We will repeat high-sensitivity troponin I, which could possibly demonstrate interval increase to 2 diffuse hypoperfusion as a consequence of systemic hypotension.  No evidence of acute bleed.  No additional evidence of obstructive hypotension.  Due to associated P4 50 implications, it is possible that recent initiation of ciprofloxacin may have altered systemic metabolism of patient's home medications, contributing to her presenting hypotension.  Plan: Lactated Ringer's at 125 cc/h.  Monitor strict I's and O's and daily weights.  Monitor on telemetry.  Monitor continuous pulse oximetry.  Work-up and management presenting urinary tract infection, including IV antibiotics, as above.  Repeat high-sensitivity troponin I, as above.  Repeat CBC in the morning.   Hold home valsartan and Norvasc.       #) Acute Kidney Injury: Presenting serum creatinine noted to be 2.37 relative to most recent prior value of 1.01 on 11/24/2020.  Suspect that this is prerenal in nature with associated multifactorial contributions stemming from dehydration due to recent decline in p.o. intake, as further noted above, as well as suspected contribution from diminished renal perfusion as a result of systemic hypotension, as further detailed above.  There is also a potential pharmacologic contribution from outpatient valsartan as well as potential alteration of metabolism of outpatient medications in the setting of recently initiated ciprofloxacin with its P4 50 implications.  Urinalysis with microscopy demonstrated the presence of hyaline cast, consistent with dehydration.   Plan: monitor strict I's & O's and daily weights. Attempt to avoid nephrotoxic agents. Refrain from NSAIDs. Repeat BMP in the morning. Check serum magnesium level.  Check serum phosphorus and ionized calcium levels.  Check random urine sodium and random urine creatinine.  Add on random urine protein to creatinine ratio given the presence of 30 protein found in presenting urinalysis.  Lactated Ringer's, as above.  Hold home valsartan.     #) Hyperlipidemia: On pravastatin as an outpatient.  Plan: Continue home statin.      #) Generalized anxiety disorder: On Cymbalta as an outpatient.  We will continue patient's home Cymbalta so as to prevent any contribution to her initial dizziness from a discontinuation syndrome from missed doses of home Cymbalta.  Plan: Continue home Cymbalta, as above.     #) GERD: On Protonix as an outpatient.  Plan: Continue on PPI.      DVT prophylaxis: scd's  Code Status: Full code Family Communication: Patient's case was discussed with her husband, who was present at bedside Disposition Plan: Per Rounding Team Consults called: none  Admission status:  Inpatient; PCU      Of note, this patient was added by me to the following Admit List/Treatment Team: [email or web address].      PLEASE NOTE THAT DRAGON DICTATION SOFTWARE WAS USED IN THE CONSTRUCTION OF THIS NOTE.  Rhetta Mura DO [facility] Pager [phone] From [place]   11/29/2020, 11:57 PM

## 2020-11-29 NOTE — ED Provider Notes (Signed)
MSE was initiated and I personally evaluated the patient and placed orders (if any) at  8:49 PM on November 29, 2020.  The patient appears stable so that the remainder of the MSE may be completed by another provider.   Patient placed in Quick Look pathway, seen and evaluated   Chief Complaint: dizziness, lethargy, fatigue   HPI:   50 y.o. F who presents for evaluation of lethargy, fatigue, dizziness that began today. Repotrs that she has felt like she had no energy and has been sleeping all day. She took her BP and noted it to be low. She reports she has had some occasional chest pain and abdominal pain. No fevers. She was recently diagnosed with UTI and started on Cipro.   ROS: fatigue, CP, abd pain, dizziness.   Physical Exam:   Gen: No distress  Neuro: Awake and Alert  Skin: Warm    Focused Exam: abd is soft, non-distended. Diffuse tenderness. RRR. Lungs CTAB.   8:51 PM: Notified charge RN that patient should be moved patient to the main ED immediately.    Initiation of care has begun. The patient has been counseled on the process, plan, and necessity for staying for the completion/evaluation, and the remainder of the medical screening examination    Desma Mcgregor 11/29/20 2147    Lucrezia Starch, MD 11/29/20 2224

## 2020-11-29 NOTE — ED Triage Notes (Signed)
Pt came in with c/o dizziness. She states hat she was diagnosed with a UTI Tue. She started taking Cipro yesterday. She states she took her second dose of Cipro at 0900 this morning. The dizziness and low bp started soon after taking the Cipro. Pt states her BP at home was 70s/40s. Current BP 89/63.

## 2020-11-29 NOTE — ED Provider Notes (Signed)
[place] DEPT Provider Note   CSN: [id number] Arrival date & time: 11/29/20  2028     History Chief Complaint  Patient presents with  . Dizziness    Anna Wilson is a 50 y.o. female.  Presents to ER with concern for dizziness, lightheadedness.  Patient reports that she was diagnosed with UTI on Tuesday, started taking Cipro yesterday.  Second dose of Cipro was taken this morning.  States that she had lightheadedness throughout the day today.  Worse with any significant movement.  Improved with rest.  Also states that she is having some abdominal discomfort, worse in her lower abdomen.  Also is currently having a headache.  No chest pain.  No difficulty in breathing.  States that her blood pressure at home was in the 56O systolic.  No fevers.Marland Kitchen  HPI     Past Medical History:  Diagnosis Date  . Ankylosing spondylitis [facility])    rhemotology--- dr Amil Amen,  treated with humira injection  . Anxiety   . Carpal tunnel syndrome, bilateral   . DDD (degenerative disc disease), lumbosacral   . Frequency of urination   . History of 2019 novel coronavirus disease (COVID-19) 03/24/2020   per pt had mild symptoms that resolved in 10 days and lingering cough resolved few weeks later  . History of chest pain    in epic was referred to cardiology, dr Johnsie Cancel (note 01-28-2017) by pt's pcp;  previous nuclear stress test negative for ischemia ef 69% (results in care everywhere 12-22-2016) and normal echo results 08-13-2014 in care everywhere;   pt had cardiac cath @[facility]  03-17-2017 (in epic) no angiographic evidence of CAD and LVEF 65%, non-cardiac chest pain  . History of esophageal spasm    per pt hx chest pain from to no be non-cardiac but esophageal spasm and takes protonix which pt stated no spasm's taking protonix  . History of kidney stones   . History of peptic ulcer 2016  . History of recurrent UTIs   . Hyperlipidemia   . Hypertension    followed by pcp  .  Migraines    followed by pcp  . OA (osteoarthritis)   . Renal calculus, left   . Wears glasses     Patient Active Problem List   Diagnosis Date Noted  . Ankylosing spondylitis ([place]) 05/31/2020  . Attention deficit hyperactivity disorder (ADHD) 05/31/2020  . Benign essential HTN 02/24/2020  . Nephrolithiasis 02/24/2020  . GERD (gastroesophageal reflux disease) 02/24/2020  . Hyperlipidemia 02/24/2020  . Pyelonephritis 02/24/2020  . Precordial pain     Past Surgical History:  Procedure Laterality Date  . ANTERIOR CRUCIATE LIGAMENT (ACL) REVISION Left 03-09-2018  @[facility]    arthroscopy and quadriceps tendon graft  . CHOLECYSTECTOMY OPEN  1308   AND UMBILICIAL HERNIA REPAIR AND ABDOMINOPLASTY  . CYSTOSCOPY W/ URETERAL STENT PLACEMENT Left 05/31/2020   Procedure: CYSTOSCOPY WITH RETROGRADE PYELOGRAM/URETERAL STENT PLACEMENT;  Surgeon: Janith Lima, MD;  Location: [facility];  Service: Urology;  Laterality: Left;  . CYSTOSCOPY/RETROGRADE/URETEROSCOPY/STONE EXTRACTION WITH BASKET Right 02/25/2020   Procedure: CYSTOSCOPY RETROGRADE/ URETEROSCOPY/STONE EXTRACTION WITH BASKET/URETERAL STENT PLACEMENT ;  Surgeon: Cleon Gustin, MD;  Location: [facility];  Service: Urology;  Laterality: Right;  . CYSTOSCOPY/RETROGRADE/URETEROSCOPY/STONE EXTRACTION WITH BASKET  2012  . CYSTOSCOPY/RETROGRADE/URETEROSCOPY/STONE EXTRACTION WITH BASKET Left 06/11/2020   Procedure: CYSTOSCOPY/RETROGRADE/URETEROSCOPY/ LASER LITHOTRIPSY/ STONE EXTRACTION WITH BASKET/ LEFT STENT EXCHANGE;  Surgeon: Janith Lima, MD;  Location: [facility];  Service: Urology;  Laterality: Left;  ONLY NEEDS  2 MIN  . KNEE ARTHROSCOPY W/ ACL RECONSTRUCTION Left 2002  . KNEE ARTHROSCOPY W/ SYNOVECTOMY Left 07-15-2018  @[facility]    lysis adhesions  . KNEE SURGERY Left 2004 and 2017   revision scar tissue  . LEFT HEART CATH AND CORONARY ANGIOGRAPHY N/A 03/17/2017   Procedure: LEFT HEART CATH AND CORONARY ANGIOGRAPHY;  Surgeon:  Burnell Blanks, MD;  Location: [facility];  Service: Cardiovascular;  Laterality: N/A;  . LUMBAR SPINE SURGERY  07-15-2019  @[facility]    L4--5 disectomy decompression;  L5--S1 laminectomy  . PELVIC LAPAROSCOPY  1992   for endometriosis  . POSTERIOR LUMBAR FUSION  12-20-2019  @[facility]    L4--5 revision laminectomy and fusion  . TOTAL LAPAROSCOPIC HYSTERECTOMY WITH BILATERAL SALPINGO OOPHORECTOMY  02/26/2016     OB History   No obstetric history on file.     Family History  Problem Relation Age of Onset  . Hypertension Mother   . Hypertension Father   . Hypothyroidism Brother   . Rheum arthritis Daughter   . Asthma Daughter   . Hypothyroidism Brother   . Asthma Son     Social History   Tobacco Use  . Smoking status: Former Smoker    Years: 6.00    Types: Cigarettes    Quit date: 06/07/1994    Years since quitting: 26.4  . Smokeless tobacco: Never Used  Vaping Use  . Vaping Use: Never used  Substance Use Topics  . Alcohol use: Yes    Comment: occasional  . Drug use: No    Home Medications Prior to Admission medications   Medication Sig Start Date End Date Taking? Authorizing Provider  Adalimumab 40 MG/0.8ML PNKT Inject 40 mg into the skin every 14 (fourteen) days.     [provider]  ADDERALL XR 20 MG 24 hr capsule Take 20 mg by mouth daily.  04/18/20   [provider]  amLODipine-valsartan (EXFORGE) 10-320 MG tablet Take 1 tablet by mouth daily.  01/26/20   [provider]  docusate sodium (COLACE) 250 MG capsule Take 250 mg by mouth at bedtime.    [provider]  DULoxetine (CYMBALTA) 60 MG capsule Take 60 mg by mouth daily. 01/22/20   [provider]  escitalopram (LEXAPRO) 20 MG tablet Take 20 mg by mouth daily. 04/27/20   [provider]  Multiple Vitamin (MULTIVITAMIN ADULT PO) Take 1 tablet by mouth at bedtime.     [provider]  Omega-3 1000 MG CAPS Take 1,000 mg by mouth at bedtime.      [provider]  Ondansetron HCl (ZOFRAN PO) Take 1 tablet by mouth every 8 (eight) hours as needed (nausea/vomiting).    [provider]  oxyCODONE-acetaminophen (PERCOCET) 5-325 MG tablet Take 1 tablet by mouth every 4 (four) hours as needed for up to 6 doses for severe pain. 06/11/20   Janith Lima, MD  pantoprazole (PROTONIX) 40 MG tablet Take 40 mg by mouth 2 (two) times daily.    [provider]  phenazopyridine (PYRIDIUM) 95 MG tablet Take 95 mg by mouth 3 (three) times daily as needed for pain. AZO brand    [provider]  pravastatin (PRAVACHOL) 20 MG tablet Take 20 mg by mouth daily.    [provider]  Probiotic Product (PROBIOTIC DAILY PO) Take 1 capsule by mouth daily. 62 billion    [provider]  Red Yeast Rice Extract 600 MG CAPS Take 600 mg by mouth at bedtime.  [provider]  SUMAtriptan (IMITREX) 100 MG tablet Take 100 mg by mouth every 2 (two) hours as needed for migraine or headache.  05/26/16   [provider]  topiramate (TOPAMAX) 50 MG tablet Take 50 mg by mouth 2 (two) times daily. 12/14/19   [provider]  Turmeric (QC TUMERIC COMPLEX PO) Take 144 mg by mouth daily.    [provider]    Allergies    Nsaids, Sulfa antibiotics, and Sulfasalazine  Review of Systems   Review of Systems  Constitutional: Positive for fatigue. Negative for chills and fever.  HENT: Negative for ear pain and sore throat.   Eyes: Negative for pain and visual disturbance.  Respiratory: Negative for cough and shortness of breath.   Cardiovascular: Negative for chest pain and palpitations.  Gastrointestinal: Positive for abdominal pain and nausea. Negative for vomiting.  Genitourinary: Negative for dysuria and hematuria.  Musculoskeletal: Negative for arthralgias and back pain.  Skin: Negative for color change and rash.  Neurological: Positive for light-headedness. Negative for seizures and  syncope.  All other systems reviewed and are negative.   Physical Exam Updated Vital Signs BP 93/64   Pulse 72   Temp 98.7 F (37.1 C) (Oral)   Resp 15   Ht 5' 3.5" (1.613 m)   Wt 61.2 kg   LMP  (LMP Unknown)   SpO2 99%   BMI 23.54 kg/m   Physical Exam Vitals and nursing note reviewed.  Constitutional:      General: She is not in acute distress.    Appearance: She is well-developed.  HENT:     Head: Normocephalic and atraumatic.  Eyes:     Conjunctiva/sclera: Conjunctivae normal.  Cardiovascular:     Rate and Rhythm: Normal rate and regular rhythm.     Heart sounds: No murmur heard.   Pulmonary:     Effort: Pulmonary effort is normal. No respiratory distress.     Breath sounds: Normal breath sounds.  Abdominal:     Palpations: Abdomen is soft.     Tenderness: There is no abdominal tenderness.     Comments: Tenderness to palpation noted in left lower quadrant  Musculoskeletal:     Cervical back: Neck supple.  Skin:    General: Skin is warm and dry.  Neurological:     Mental Status: She is alert.     ED Results / Procedures / Treatments   Labs (all labs ordered are listed, but only abnormal results are displayed) Labs Reviewed  COMPREHENSIVE METABOLIC PANEL - Abnormal; Notable for the following components:      Result Value   CO2 21 (*)    BUN 31 (*)    Creatinine, Ser 2.37 (*)    AST 14 (*)    GFR, Estimated 25 (*)    All other components within normal limits  CBC WITH DIFFERENTIAL/PLATELET - Abnormal; Notable for the following components:   Hemoglobin 11.9 (*)    All other components within normal limits  URINALYSIS, ROUTINE W REFLEX MICROSCOPIC - Abnormal; Notable for the following components:   APPearance HAZY (*)    Protein, ur 30 (*)    Leukocytes,Ua MODERATE (*)    Bacteria, UA FEW (*)    All other components within normal limits  CULTURE, BLOOD (ROUTINE X 2)  CULTURE, BLOOD (ROUTINE X 2)  LACTIC ACID, PLASMA  LIPASE, BLOOD  TSH  TROPONIN  I (HIGH SENSITIVITY)    EKG EKG Interpretation  Date/Time:  Thursday November 29 2020 21:07:26  EDT Ventricular Rate:  65 PR Interval:  174 QRS Duration: 100 QT Interval:  407 QTC Calculation: 424 R Axis:   61 Text Interpretation: Sinus rhythm RSR' in V1 or V2, right VCD or RVH Confirmed by Madalyn Rob [phone]) on 11/29/2020 9:15:37 PM   Radiology DG Chest Portable 1 View  Result Date: 11/29/2020 CLINICAL DATA:  Recent syncopal episode EXAM: PORTABLE CHEST 1 VIEW COMPARISON:  01/05/2019 FINDINGS: The heart size and mediastinal contours are within normal limits. Both lungs are clear. The visualized skeletal structures are unremarkable. IMPRESSION: No active disease. Electronically Signed   By: Inez Catalina M.D.   On: 11/29/2020 21:45    Procedures Procedures   Medications Ordered in ED Medications  lactated ringers bolus 1,000 mL (0 mLs Intravenous Stopped 11/29/20 2224)  ondansetron (ZOFRAN) injection 4 mg (4 mg Intravenous Given 11/29/20 2134)  lactated ringers bolus 1,000 mL (1,000 mLs Intravenous New Bag/Given 11/29/20 2211)    ED Course  I have reviewed the triage vital signs and the nursing notes.  Pertinent labs & imaging results that were available during my care of the patient were reviewed by me and considered in my medical decision making (see chart for details).    MDM Rules/Calculators/A&P                         50 year old lady presenting to ER with concern for lightheadedness, low blood pressure.  On presentation, patient noted to have significantly low blood pressure however she actually was well-appearing.  Not toxic appearing. Afebrile.  Obtain broad work-up. Lab work noted for acute kidney injury. Lactate nl, trop undetectable.  Suspect medication side effect versus dehydration.  BP improved after receiving fluid boluses.  CT abdomen pelvis ordered to further evaluate the abdominal discomfort and tenderness noted on exam.  Given her reported headache also check  CT head.  While awaiting CTs, signed out to Dr. Nicholes Stairs.  Anticipate admission once CTs have been completed.  Final Clinical Impression(s) / ED Diagnoses Final diagnoses:  AKI (acute kidney injury) ([place])  Hypotension, unspecified hypotension type    Rx / DC Orders ED Discharge Orders    None       Lucrezia Starch, MD 11/29/20 2239

## 2020-11-30 ENCOUNTER — Other Ambulatory Visit: Payer: Self-pay

## 2020-11-30 ENCOUNTER — Encounter (HOSPITAL_COMMUNITY): Payer: Self-pay | Admitting: Internal Medicine

## 2020-11-30 DIAGNOSIS — F419 Anxiety disorder, unspecified: Secondary | ICD-10-CM | POA: Diagnosis present

## 2020-11-30 DIAGNOSIS — N179 Acute kidney failure, unspecified: Secondary | ICD-10-CM | POA: Diagnosis present

## 2020-11-30 DIAGNOSIS — I951 Orthostatic hypotension: Secondary | ICD-10-CM

## 2020-11-30 DIAGNOSIS — I959 Hypotension, unspecified: Secondary | ICD-10-CM | POA: Diagnosis present

## 2020-11-30 DIAGNOSIS — A419 Sepsis, unspecified organism: Principal | ICD-10-CM

## 2020-11-30 DIAGNOSIS — N390 Urinary tract infection, site not specified: Secondary | ICD-10-CM

## 2020-11-30 LAB — CBC WITH DIFFERENTIAL/PLATELET
Abs Immature Granulocytes: 0.03 10*3/uL (ref 0.00–0.07)
Basophils Absolute: 0 10*3/uL (ref 0.0–0.1)
Basophils Relative: 0 %
Eosinophils Absolute: 0.1 10*3/uL (ref 0.0–0.5)
Eosinophils Relative: 2 %
HCT: 35.5 % — ABNORMAL LOW (ref 36.0–46.0)
Hemoglobin: 11.1 g/dL — ABNORMAL LOW (ref 12.0–15.0)
Immature Granulocytes: 0 %
Lymphocytes Relative: 22 %
Lymphs Abs: 1.7 10*3/uL (ref 0.7–4.0)
MCH: 28.1 pg (ref 26.0–34.0)
MCHC: 31.3 g/dL (ref 30.0–36.0)
MCV: 89.9 fL (ref 80.0–100.0)
Monocytes Absolute: 0.7 10*3/uL (ref 0.1–1.0)
Monocytes Relative: 9 %
Neutro Abs: 5.1 10*3/uL (ref 1.7–7.7)
Neutrophils Relative %: 67 %
Platelets: 248 10*3/uL (ref 150–400)
RBC: 3.95 MIL/uL (ref 3.87–5.11)
RDW: 13.9 % (ref 11.5–15.5)
WBC: 7.7 10*3/uL (ref 4.0–10.5)
nRBC: 0 % (ref 0.0–0.2)

## 2020-11-30 LAB — COMPREHENSIVE METABOLIC PANEL
ALT: 10 U/L (ref 0–44)
AST: 17 U/L (ref 15–41)
Albumin: 3.5 g/dL (ref 3.5–5.0)
Alkaline Phosphatase: 52 U/L (ref 38–126)
Anion gap: 8 (ref 5–15)
BUN: 29 mg/dL — ABNORMAL HIGH (ref 6–20)
CO2: 21 mmol/L — ABNORMAL LOW (ref 22–32)
Calcium: 9.3 mg/dL (ref 8.9–10.3)
Chloride: 112 mmol/L — ABNORMAL HIGH (ref 98–111)
Creatinine, Ser: 1.93 mg/dL — ABNORMAL HIGH (ref 0.44–1.00)
GFR, Estimated: 32 mL/min — ABNORMAL LOW (ref >60)
Glucose, Bld: 95 mg/dL (ref 70–99)
Potassium: 3.6 mmol/L (ref 3.5–5.1)
Sodium: 141 mmol/L (ref 135–145)
Total Bilirubin: 0.5 mg/dL (ref 0.3–1.2)
Total Protein: 6.1 g/dL — ABNORMAL LOW (ref 6.5–8.1)

## 2020-11-30 LAB — RESP PANEL BY RT-PCR (FLU A&B, COVID) ARPGX2
Influenza A by PCR: NEGATIVE
Influenza B by PCR: NEGATIVE
SARS Coronavirus 2 by RT PCR: NEGATIVE

## 2020-11-30 LAB — TROPONIN I (HIGH SENSITIVITY): Troponin I (High Sensitivity): <2 ng/L (ref <18)

## 2020-11-30 LAB — SODIUM, URINE, RANDOM: Sodium, Ur: 35 mmol/L

## 2020-11-30 LAB — PHOSPHORUS: Phosphorus: 4.5 mg/dL (ref 2.5–4.6)

## 2020-11-30 LAB — PROTEIN / CREATININE RATIO, URINE
Creatinine, Urine: 122.24 mg/dL
Protein Creatinine Ratio: 0.2 mg/mg{Cre} — ABNORMAL HIGH (ref 0.00–0.15)
Total Protein, Urine: 25 mg/dL

## 2020-11-30 LAB — MAGNESIUM: Magnesium: 1.8 mg/dL (ref 1.7–2.4)

## 2020-11-30 LAB — CREATININE, URINE, RANDOM: Creatinine, Urine: 124.89 mg/dL

## 2020-11-30 MED ORDER — ACETAMINOPHEN 325 MG PO TABS
650.0000 mg | ORAL_TABLET | Freq: Four times a day (QID) | ORAL | Status: DC | PRN
  Administered 2020-11-30: 650 mg via ORAL
  Filled 2020-11-30: qty 2

## 2020-11-30 MED ORDER — SODIUM CHLORIDE 0.9 % IV SOLN
1.0000 g | INTRAVENOUS | Status: DC
  Administered 2020-11-30: 1 g via INTRAVENOUS
  Filled 2020-11-30: qty 1
  Filled 2020-11-30: qty 10

## 2020-11-30 MED ORDER — FENTANYL CITRATE (PF) 100 MCG/2ML IJ SOLN
25.0000 ug | INTRAMUSCULAR | Status: DC | PRN
  Administered 2020-11-30: 25 ug via INTRAVENOUS
  Filled 2020-11-30: qty 2

## 2020-11-30 MED ORDER — SODIUM CHLORIDE 0.9 % IV BOLUS
1000.0000 mL | Freq: Once | INTRAVENOUS | Status: AC
  Administered 2020-11-30: 1000 mL via INTRAVENOUS

## 2020-11-30 MED ORDER — ACETAMINOPHEN 650 MG RE SUPP: 650.0000 mg | Freq: Four times a day (QID) | RECTAL | Status: DC | PRN

## 2020-11-30 MED ORDER — UBROGEPANT 50 MG PO TABS: 50.0000 mg | ORAL_TABLET | Freq: Every day | ORAL | Status: DC | PRN

## 2020-11-30 MED ORDER — DULOXETINE HCL 60 MG PO CPEP
60.0000 mg | ORAL_CAPSULE | Freq: Every day | ORAL | Status: DC
  Administered 2020-11-30 – 2020-12-01 (×2): 60 mg via ORAL
  Filled 2020-11-30 (×2): qty 1

## 2020-11-30 MED ORDER — ESCITALOPRAM OXALATE 20 MG PO TABS
20.0000 mg | ORAL_TABLET | Freq: Every day | ORAL | Status: DC
  Administered 2020-11-30 – 2020-12-01 (×2): 20 mg via ORAL
  Filled 2020-11-30 (×2): qty 1

## 2020-11-30 MED ORDER — SUMATRIPTAN SUCCINATE 50 MG PO TABS
100.0000 mg | ORAL_TABLET | ORAL | Status: DC | PRN
  Filled 2020-11-30: qty 2

## 2020-11-30 MED ORDER — PRAVASTATIN SODIUM 20 MG PO TABS
20.0000 mg | ORAL_TABLET | Freq: Every day | ORAL | Status: DC
  Administered 2020-11-30 – 2020-12-01 (×2): 20 mg via ORAL
  Filled 2020-11-30 (×2): qty 1

## 2020-11-30 MED ORDER — PANTOPRAZOLE SODIUM 40 MG PO TBEC
40.0000 mg | DELAYED_RELEASE_TABLET | Freq: Two times a day (BID) | ORAL | Status: DC
  Administered 2020-12-01: 40 mg via ORAL
  Filled 2020-11-30: qty 1

## 2020-11-30 MED ORDER — TOPIRAMATE 25 MG PO TABS
50.0000 mg | ORAL_TABLET | Freq: Two times a day (BID) | ORAL | Status: DC
  Administered 2020-11-30 – 2020-12-01 (×3): 50 mg via ORAL
  Filled 2020-11-30 (×3): qty 2

## 2020-11-30 MED ORDER — LACTATED RINGERS IV SOLN: INTRAVENOUS | Status: DC

## 2020-11-30 NOTE — ED Notes (Signed)
Vitals updated. Maryclare Bean, RN messaged.

## 2020-11-30 NOTE — Progress Notes (Signed)
PROGRESS NOTE    Anna Wilson  ZOX:[id number] DOB: 02-11-1971 DOA: 11/29/2020 PCP: Elenore Paddy, NP  Brief Narrative:  Patient is a 50 year old Caucasian female with a past medical history significant for but not limited to hypertension, hyperlipidemia, recurrent urinary tract infections, generalized anxiety disorder as well as ankylosing spondylitis and other comorbidities who presented to [facility] ED complaining of dizziness.  She reported 1 day of dizziness and lightheadedness worsening when rising from a seated to standing position.  Blood pressure was checked and noted to be in the 70 systolic and denies any recent changes to her home antihypertensive medications.  She has been experiencing 4 days of dysuria, leading diagnosis of UTI as an outpatient on 419 which she was prescribed ciprofloxacin.  She states that she was able to take 2 doses of Cipro and had compliance but then started having the dizziness.  She notes a decline in oral intake that is slight but denied any nausea, vomiting diarrhea.  Continues to complain of some lower abdominal discomfort that is intermittent and crampy in nature and also complains of some burning.  She attributes these type of symptoms similar to her previous UTIs.  She was brought to the ED and admitted with sepsis secondary to urinary tract infection.  In the ED she was given acetaminophen, IV Rocephin and 2 L of lactated Ringer boluses.  Currently she is being treated for and evaluated for the management of sepsis secondary UTI and orthostatic hypotension  Assessment & Plan:   Principal Problem:   Acute cystitis Active Problems:   GERD (gastroesophageal reflux disease)   Hyperlipidemia   Hypotension   AKI (acute kidney injury) ([place])   Anxiety  Sepsis 2/2 Acute Cystitis/UTI poA -Diagnosis on the basis of 4 days of dysuria, with persistence of symptoms in spite of 2 days of outpatient ciprofloxacin, with presenting urinalysis still demonstrating  evidence of significant pyuria.   -Of note, patient does not meet SIRS criteria to be considered septic at this time.   -CT abdomen/pelvis showed no evidence of pyelonephritis but did show bilateral nonobstructive nephrolithiasis without any evidence of any associated hydronephrosis -Consequently, patient's urinary tract infection appears most consistent with acute cystitis.  -Blood cultures x2 were collected prior to initiation of Rocephin in the ED.  -Urinalysis done and showed a hazy appearance with moderate leukocytes, negative nitrates, few bacteria seen, 0-5 squamous epithelial cells, 0-5 RBCs per high-power field, 21-50 WBCs and urine culture and blood cultures are still pending -C/w Empiric Abx with IV Ceftriaxone 1 g q24h -Patient received 2 Liters of LR boluses and placed on maintenance IVF with LR at 125 mL/hr; will give an additional 1 L normal saline bolus today -Follow Temperature Curve and WBC Trend   Orthostatic Hypotension -Feels dizzy when rising from a seated to standing position -Blood pressure noted to be on the lower side and she has acute renal failure and likely in setting of dehydration from UTI -We will need orthostatic vital signs in the morning -IV fluid hydration as above -We will obtain PT OT evaluation if not improving  Hypotension but has a Hx of HTN -Holding Antihypertensives of Amlodipine-Valsartan 10-320 mg po Daily  -Given another 1 Liter Bolus -Continue to Monitor BP per Protocol  -Last BP was 126/80  GERD/Hx of Peptic Ulcer -C/w Pantoprazole 40 mg po BID  Hyperlipidemia -C/w Pravastatin 20 mg po Daily  Anxiety/GAD and Depression -C/w Duloxetine 60 mg po Daily and Escitaloparm 20 mg po Daily   Migraines -  Lorrin Goodell at [facility] but not on hospital formulary; Will substitute with Sumatriptan until patient can bring in Home Supply of Ubrogepant 50-100 mg po Daily Migraine   Ankylosing Spondylitis/DDD/OA  -Takes Adalimumab 40 mg into the skin q14  Days and will hold -Judicious use of Oxycodone-Acetaminophen 1 tab po [id number]   AKI Metabolic Acidosis -Patient's BUN/Cr went from 31/2.37 on Admission and is now 29/1.93 -Urine Total Protein was 25, Protein/Cr Ratio was 0.20, Urine Na+ was 35 and Ur Cr was 124.89 -C/w  -Has a Small acidosis with a CO2 of 21, AG of 8, Chloride Level of 112 -Strict I's and O's and Daily Weights  -Avoid Nephrotoxic Medications, Contrast Dyes, Hypotension, and Renally Adjust Medications -Continue to Monitor and Trend Renal Fxn and Repeat CMP in the AM  Normocytic Anemia, Unclear Etiology -Patient's Hgb/Hct went from 11.9/38.3 -> 11.1/35.5 -Check Anemia Panel in the AM -Continue to Monitor for S/Sx of Bleeding; Currently no overt bleeding noted -Repeat CBC in the AM   DVT prophylaxis: SCDs; Will add Heparin 5,000 sq q8h Code Status: FULL CODE  Family Communication: Discussed with daughter at bedside Disposition Plan: Pending further clinical improvement and evaluation of her UTI  Status is: Inpatient  Remains inpatient appropriate because:Unsafe d/c plan, IV treatments appropriate due to intensity of illness or inability to take PO and Inpatient level of care appropriate due to severity of illness   Dispo: The patient is from: Home              Anticipated d/c is to: Home              Patient currently is not medically stable to d/c.   Difficult to place patient No  Consultants:   None   Procedures: None  Antimicrobials:  Anti-infectives (From admission, onward)   Start     Dose/Rate Route Frequency Ordered Stop   11/30/20 1900  cefTRIAXone (ROCEPHIN) 1 g in sodium chloride 0.9 % 100 mL IVPB        1 g 200 mL/hr over 30 Minutes Intravenous Every 24 hours 11/30/20 0013     11/29/20 2300  cefTRIAXone (ROCEPHIN) 1 g in sodium chloride 0.9 % 100 mL IVPB        1 g 200 mL/hr over 30 Minutes Intravenous  Once 11/29/20 2247 11/29/20 2348        Subjective: Seen and examined at bedside  states that she still not feeling as well.  No nausea or vomiting.  Continues to have some burning and discomfort in her urine.  States he has had multiple urinary tract infections and follows up with Dr. Abner Greenspan in urology because of them.  States that she only took 2 doses of Cipro.  She is complaining of a headache now.  No other concerns or complaints at this time.  Objective: Vitals:   11/30/20 0100 11/30/20 0230 11/30/20 0323 11/30/20 0500  BP: 104/69 100/66 105/69   Pulse: 73 70 64   Resp: 16 14 18    Temp:  97.8 F (36.6 C) 97.7 F (36.5 C)   TempSrc:  Oral    SpO2: 97% 97% 100%   Weight:    65.6 kg  Height:    5' 4"  (1.626 m)    Intake/Output Summary (Last 24 hours) at 11/30/2020 0805 Last data filed at 11/30/2020 0600 Gross per 24 hour  Intake 2353.04 ml  Output 500 ml  Net 1853.04 ml   Filed Weights   11/29/20 2037 11/30/20 0500  Weight:  61.2 kg 65.6 kg   Examination: Physical Exam:  Constitutional: WN/WD Caucasian female currently in no acute distress appears calm Eyes: Lids and conjunctivae normal, sclerae anicteric  ENMT: External Ears, Nose appear normal. Grossly normal hearing.  Neck: Appears normal, supple, no cervical masses, normal ROM, no appreciable thyromegaly; no JVD Respiratory: Diminished to auscultation bilaterally with coarse breath sounds, no wheezing, rales, rhonchi or crackles. Normal respiratory effort and patient is not tachypenic. No accessory muscle use.  Unlabored breathing Cardiovascular: RRR, no murmurs / rubs / gallops. S1 and S2 auscultated.  No extremity edema Abdomen: Soft, mildly tender to palpation lower abdomen, non-distended.  Bowel sounds positive.  GU: Deferred. Musculoskeletal: No clubbing / cyanosis of digits/nails. No joint deformity upper and lower extremities.  Skin: No rashes, lesions, ulcers on limited skin evaluation. No induration; Warm and dry.  Neurologic: CN 2-12 grossly intact with no focal deficits. Romberg sign and  cerebellar reflexes not assessed.  Psychiatric: Normal judgment and insight. Alert and oriented x 3. Normal mood and appropriate affect.   Data Reviewed: I have personally reviewed following labs and imaging studies  CBC: Recent Labs  Lab 11/24/20 1712 11/29/20 2057 11/30/20 0500  WBC 12.6* 7.8 7.7  NEUTROABS 8.9* 4.8 5.1  HGB 13.8 11.9* 11.1*  HCT 42.7 38.3 35.5*  MCV 88.8 90.5 89.9  PLT 412* 328 426   Basic Metabolic Panel: Recent Labs  Lab 11/24/20 1712 11/29/20 2057 11/30/20 0500  NA 141 139 141  K 3.6 4.3 3.6  CL 110 108 112*  CO2 22 21* 21*  GLUCOSE 108* 96 95  BUN 15 31* 29*  CREATININE 1.01* 2.37* 1.93*  CALCIUM 9.9 9.9 9.3  MG  --   --  1.8  PHOS  --   --  4.5   GFR: Estimated Creatinine Clearance: 31.1 mL/min (A) (by C-G formula based on SCr of 1.93 mg/dL (H)). Liver Function Tests: Recent Labs  Lab 11/29/20 2057 11/30/20 0500  AST 14* 17  ALT 11 10  ALKPHOS 57 52  BILITOT 0.4 0.5  PROT 7.1 6.1*  ALBUMIN 4.0 3.5   Recent Labs  Lab 11/29/20 2057  LIPASE 49   No results for input(s): AMMONIA in the last 168 hours. Coagulation Profile: No results for input(s): INR, PROTIME in the last 168 hours. Cardiac Enzymes: No results for input(s): CKTOTAL, CKMB, CKMBINDEX, TROPONINI in the last 168 hours. BNP (last 3 results) No results for input(s): PROBNP in the last 8760 hours. HbA1C: No results for input(s): HGBA1C in the last 72 hours. CBG: No results for input(s): GLUCAP in the last 168 hours. Lipid Profile: No results for input(s): CHOL, HDL, LDLCALC, TRIG, CHOLHDL, LDLDIRECT in the last 72 hours. Thyroid Function Tests: Recent Labs    11/29/20 2125  TSH 2.218   Anemia Panel: No results for input(s): VITAMINB12, FOLATE, FERRITIN, TIBC, IRON, RETICCTPCT in the last 72 hours. Sepsis Labs: Recent Labs  Lab 11/29/20 2057  LATICACIDVEN 1.0    Recent Results (from the past 240 hour(s))  Resp Panel by RT-PCR (Flu A&B, Covid)  Nasopharyngeal Swab     Status: None   Collection Time: 11/29/20 11:37 PM   Specimen: Nasopharyngeal Swab; Nasopharyngeal(NP) swabs in vial transport medium  Result Value Ref Range Status   SARS Coronavirus 2 by RT PCR NEGATIVE NEGATIVE Final    Comment: (NOTE) SARS-CoV-2 target nucleic acids are NOT DETECTED.  The SARS-CoV-2 RNA is generally detectable in upper respiratory specimens during the acute phase of infection. The lowest  concentration of SARS-CoV-2 viral copies this assay can detect is 138 copies/mL. A negative result does not preclude SARS-Cov-2 infection and should not be used as the sole basis for treatment or other patient management decisions. A negative result may occur with  improper specimen collection/handling, submission of specimen other than nasopharyngeal swab, presence of viral mutation(s) within the areas targeted by this assay, and inadequate number of viral copies(<138 copies/mL). A negative result must be combined with clinical observations, patient history, and epidemiological information. The expected result is Negative.  Fact Sheet for Patients:  EntrepreneurPulse.com.au  Fact Sheet for Healthcare Providers:  IncredibleEmployment.be  This test is no t yet approved or cleared by the [place] FDA and  has been authorized for detection and/or diagnosis of SARS-CoV-2 by FDA under an Emergency Use Authorization (EUA). This EUA will remain  in effect (meaning this test can be used) for the duration of the COVID-19 declaration under Section 564(b)(1) of the Act, 21 U.S.C.section 360bbb-3(b)(1), unless the authorization is terminated  or revoked sooner.       Influenza A by PCR NEGATIVE NEGATIVE Final   Influenza B by PCR NEGATIVE NEGATIVE Final    Comment: (NOTE) The Xpert Xpress SARS-CoV-2/FLU/RSV plus assay is intended as an aid in the diagnosis of influenza from Nasopharyngeal swab specimens and should not be  used as a sole basis for treatment. Nasal washings and aspirates are unacceptable for Xpert Xpress SARS-CoV-2/FLU/RSV testing.  Fact Sheet for Patients: EntrepreneurPulse.com.au  Fact Sheet for Healthcare Providers: IncredibleEmployment.be  This test is not yet approved or cleared by the [place] FDA and has been authorized for detection and/or diagnosis of SARS-CoV-2 by FDA under an Emergency Use Authorization (EUA). This EUA will remain in effect (meaning this test can be used) for the duration of the COVID-19 declaration under Section 564(b)(1) of the Act, 21 U.S.C. section 360bbb-3(b)(1), unless the authorization is terminated or revoked.  Performed at [facility], [place] [address], [place], [place] [address]    RN Pressure Injury Documentation:     Estimated body mass index is 24.82 kg/m as calculated from the following:   Height as of this encounter: 5' 4"  (1.626 m).   Weight as of this encounter: 65.6 kg.  Malnutrition Type:   Malnutrition Characteristics:   Nutrition Interventions:    Radiology Studies: CT ABDOMEN PELVIS WO CONTRAST  Result Date: 11/29/2020 CLINICAL DATA:  Left lower quadrant abdominal pain, dizziness, recently diagnosed with UTI EXAM: CT ABDOMEN AND PELVIS WITHOUT CONTRAST TECHNIQUE: Multidetector CT imaging of the abdomen and pelvis was performed following the standard protocol without IV contrast. COMPARISON:  Renal ultrasound 07/11/2020 FINDINGS: Lower chest: Lung bases are clear. Normal heart size. No pericardial effusion. Hepatobiliary: No visible worrisome focal liver abnormality is seen within the limitations of this unenhanced CT. Normal hepatic attenuation. Smooth liver surface contour. Prior cholecystectomy without significant biliary ductal dilatation or visible intraductal gallstone. Pancreas: No pancreatic ductal dilatation or surrounding inflammatory changes. Spleen: Normal in  size. No concerning splenic lesions. Adrenals/Urinary Tract: Normal adrenals. Kidneys are symmetric in size and normally located. Small fluid attenuation parapelvic cyst in the lower pole right kidney measuring approximately 2 cm in size. No concerning visible or contour deforming renal lesions. Bilateral nonobstructive nephrolithiasis. No frank hydronephrosis or obstructive urolith. Urinary bladder is unremarkable. Stomach/Bowel: Distal esophagus, stomach and duodenal sweep are unremarkable. No small bowel wall thickening or dilatation. No evidence of obstruction. A normal appendix is visualized. No colonic dilatation or wall thickening. Vascular/Lymphatic:  Atherosclerotic calcifications within the abdominal aorta and branch vessels. No aneurysm or ectasia. No enlarged abdominopelvic lymph nodes. Reproductive: Urinary bladder is unremarkable. Other: No abdominal wall hernia or abnormality. No abdominopelvic ascites. Musculoskeletal: L4-5 PLIF without acute hardware complication. No acute osseous abnormality or suspicious osseous lesion. Musculature is normal and symmetric. IMPRESSION: 1. No acute intra-abdominal process to provide cause for patient's symptoms. 2. Bilateral nonobstructive nephrolithiasis. No obstructive uropathy, hydronephrosis or other acute urinary tract abnormality. 3. Prior cholecystectomy. 4. L4-5 PLIF without acute hardware complication. 5. Aortic Atherosclerosis (ICD10-I70.0). Electronically Signed   By: Lovena Le M.D.   On: 11/29/2020 23:19   CT Head Wo Contrast  Result Date: 11/29/2020 CLINICAL DATA:  Headaches and dizziness EXAM: CT HEAD WITHOUT CONTRAST TECHNIQUE: Contiguous axial images were obtained from the base of the skull through the vertex without intravenous contrast. COMPARISON:  01/05/2019 FINDINGS: Brain: No evidence of acute infarction, hemorrhage, hydrocephalus, extra-axial collection or mass lesion/mass effect. Vascular: No hyperdense vessel or unexpected  calcification. Skull: Normal. Negative for fracture or focal lesion. Sinuses/Orbits: No acute finding. Other: None. IMPRESSION: No acute intracranial abnormality noted. Electronically Signed   By: Inez Catalina M.D.   On: 11/29/2020 23:15   DG Chest Portable 1 View  Result Date: 11/29/2020 CLINICAL DATA:  Recent syncopal episode EXAM: PORTABLE CHEST 1 VIEW COMPARISON:  01/05/2019 FINDINGS: The heart size and mediastinal contours are within normal limits. Both lungs are clear. The visualized skeletal structures are unremarkable. IMPRESSION: No active disease. Electronically Signed   By: Inez Catalina M.D.   On: 11/29/2020 21:45   Scheduled Meds: . DULoxetine  60 mg Oral Daily  . [START ON 12/01/2020] pantoprazole  40 mg Oral BID  . pravastatin  20 mg Oral Daily   Continuous Infusions: . cefTRIAXone (ROCEPHIN)  IV    . lactated ringers 125 mL/hr at 11/30/20 0108    LOS: 1 day   Kerney Elbe, DO [facility] PAGER is on [place]  If 7PM-7AM, please contact night-coverage [email or web address]

## 2020-12-01 LAB — COMPREHENSIVE METABOLIC PANEL
ALT: 10 U/L (ref 0–44)
AST: 17 U/L (ref 15–41)
Albumin: 3.5 g/dL (ref 3.5–5.0)
Alkaline Phosphatase: 55 U/L (ref 38–126)
Anion gap: 10 (ref 5–15)
BUN: 17 mg/dL (ref 6–20)
CO2: 22 mmol/L (ref 22–32)
Calcium: 9.1 mg/dL (ref 8.9–10.3)
Chloride: 110 mmol/L (ref 98–111)
Creatinine, Ser: 0.96 mg/dL (ref 0.44–1.00)
GFR, Estimated: >60 mL/min (ref >60)
Glucose, Bld: 79 mg/dL (ref 70–99)
Potassium: 3.2 mmol/L — ABNORMAL LOW (ref 3.5–5.1)
Sodium: 142 mmol/L (ref 135–145)
Total Bilirubin: 0.3 mg/dL (ref 0.3–1.2)
Total Protein: 6.6 g/dL (ref 6.5–8.1)

## 2020-12-01 LAB — URINE CULTURE: Culture: <10000 — AB

## 2020-12-01 LAB — CBC WITH DIFFERENTIAL/PLATELET
Abs Immature Granulocytes: 0.02 10*3/uL (ref 0.00–0.07)
Basophils Absolute: 0 10*3/uL (ref 0.0–0.1)
Basophils Relative: 1 %
Eosinophils Absolute: 0.2 10*3/uL (ref 0.0–0.5)
Eosinophils Relative: 3 %
HCT: 36 % (ref 36.0–46.0)
Hemoglobin: 11.2 g/dL — ABNORMAL LOW (ref 12.0–15.0)
Immature Granulocytes: 0 %
Lymphocytes Relative: 36 %
Lymphs Abs: 2.1 10*3/uL (ref 0.7–4.0)
MCH: 28.4 pg (ref 26.0–34.0)
MCHC: 31.1 g/dL (ref 30.0–36.0)
MCV: 91.1 fL (ref 80.0–100.0)
Monocytes Absolute: 0.5 10*3/uL (ref 0.1–1.0)
Monocytes Relative: 8 %
Neutro Abs: 3.1 10*3/uL (ref 1.7–7.7)
Neutrophils Relative %: 52 %
Platelets: 274 10*3/uL (ref 150–400)
RBC: 3.95 MIL/uL (ref 3.87–5.11)
RDW: 13.7 % (ref 11.5–15.5)
WBC: 5.9 10*3/uL (ref 4.0–10.5)
nRBC: 0 % (ref 0.0–0.2)

## 2020-12-01 LAB — PHOSPHORUS: Phosphorus: 3.3 mg/dL (ref 2.5–4.6)

## 2020-12-01 LAB — CALCIUM, IONIZED: Calcium, Ionized, Serum: 5 mg/dL (ref 4.5–5.6)

## 2020-12-01 LAB — MAGNESIUM: Magnesium: 1.8 mg/dL (ref 1.7–2.4)

## 2020-12-01 MED ORDER — POTASSIUM CHLORIDE CRYS ER 20 MEQ PO TBCR
40.0000 meq | EXTENDED_RELEASE_TABLET | Freq: Two times a day (BID) | ORAL | Status: DC
  Administered 2020-12-01: 40 meq via ORAL
  Filled 2020-12-01: qty 2

## 2020-12-01 MED ORDER — ONDANSETRON HCL 4 MG PO TABS: 4.0000 mg | ORAL_TABLET | Freq: Three times a day (TID) | ORAL | 0 refills | Status: AC | PRN

## 2020-12-01 MED ORDER — AMLODIPINE BESYLATE-VALSARTAN 5-160 MG PO TABS: 1.0000 | ORAL_TABLET | Freq: Every day | ORAL | 0 refills | Status: DC

## 2020-12-01 MED ORDER — CEFDINIR 300 MG PO CAPS: 300.0000 mg | ORAL_CAPSULE | Freq: Two times a day (BID) | ORAL | 0 refills | Status: AC

## 2020-12-01 MED ORDER — CEFDINIR 300 MG PO CAPS
300.0000 mg | ORAL_CAPSULE | Freq: Two times a day (BID) | ORAL | Status: DC
  Filled 2020-12-01: qty 1

## 2020-12-01 MED ORDER — SODIUM CHLORIDE 0.9 % IV SOLN: INTRAVENOUS | Status: DC

## 2020-12-01 MED ORDER — SODIUM CHLORIDE 0.9 % IV BOLUS
1000.0000 mL | Freq: Once | INTRAVENOUS | Status: AC
  Administered 2020-12-01: 1000 mL via INTRAVENOUS

## 2020-12-01 NOTE — Plan of Care (Signed)
  Problem: Clinical Measurements: Goal: Diagnostic test results will improve Outcome: Progressing   Problem: Activity: Goal: Risk for activity intolerance will decrease Outcome: Progressing   Problem: Education: Goal: Knowledge of General Education information will improve Description: Including pain rating scale, medication(s)/side effects and non-pharmacologic comfort measures Outcome: Completed/Met   Problem: Nutrition: Goal: Adequate nutrition will be maintained Outcome: Completed/Met   Problem: Elimination: Goal: Will not experience complications related to bowel motility Outcome: Completed/Met   Problem: Skin Integrity: Goal: Risk for impaired skin integrity will decrease Outcome: Completed/Met

## 2020-12-01 NOTE — Plan of Care (Signed)
  Problem: Health Behavior/Discharge Planning: Goal: Ability to manage health-related needs will improve Outcome: Adequate for Discharge   Problem: Clinical Measurements: Goal: Ability to maintain clinical measurements within normal limits will improve Outcome: Adequate for Discharge Goal: Will remain free from infection Outcome: Adequate for Discharge Goal: Diagnostic test results will improve 12/01/2020 1658 by Lennie Hummer, RN Outcome: Adequate for Discharge 12/01/2020 1449 by Lennie Hummer, RN Outcome: Progressing Goal: Cardiovascular complication will be avoided Outcome: Adequate for Discharge   Problem: Activity: Goal: Risk for activity intolerance will decrease 12/01/2020 1658 by Lennie Hummer, RN Outcome: Adequate for Discharge 12/01/2020 1449 by Lennie Hummer, RN Outcome: Progressing   Problem: Coping: Goal: Level of anxiety will decrease Outcome: Adequate for Discharge   Problem: Elimination: Goal: Will not experience complications related to urinary retention Outcome: Adequate for Discharge   Problem: Pain Managment: Goal: General experience of comfort will improve Outcome: Adequate for Discharge   Problem: Safety: Goal: Ability to remain free from injury will improve Outcome: Adequate for Discharge

## 2020-12-01 NOTE — Evaluation (Signed)
Clinical/Bedside Swallow Evaluation Patient Details  Name: Anna Wilson MRN: [id number] Date of Birth: Aug 18, 1970  Today's Date: 12/01/2020 Time: SLP Start Time (ACUTE ONLY): 49 SLP Stop Time (ACUTE ONLY): 1630 SLP Time Calculation (min) (ACUTE ONLY): 15 min  Past Medical History:  Past Medical History:  Diagnosis Date  . Ankylosing spondylitis [facility])    rhemotology--- dr Amil Amen,  treated with humira injection  . Anxiety   . Carpal tunnel syndrome, bilateral   . DDD (degenerative disc disease), lumbosacral   . Frequency of urination   . History of 2019 novel coronavirus disease (COVID-19) 03/24/2020   per pt had mild symptoms that resolved in 10 days and lingering cough resolved few weeks later  . History of chest pain    in epic was referred to cardiology, dr Johnsie Cancel (note 01-28-2017) by pt's pcp;  previous nuclear stress test negative for ischemia ef 69% (results in care everywhere 12-22-2016) and normal echo results 08-13-2014 in care everywhere;   pt had cardiac cath @[facility]  03-17-2017 (in epic) no angiographic evidence of CAD and LVEF 65%, non-cardiac chest pain  . History of esophageal spasm    per pt hx chest pain from to no be non-cardiac but esophageal spasm and takes protonix which pt stated no spasm's taking protonix  . History of kidney stones   . History of peptic ulcer 2016  . History of recurrent UTIs   . Hyperlipidemia   . Hypertension    followed by pcp  . Migraines    followed by pcp  . OA (osteoarthritis)   . Renal calculus, left   . Wears glasses    Past Surgical History:  Past Surgical History:  Procedure Laterality Date  . ANTERIOR CRUCIATE LIGAMENT (ACL) REVISION Left 03-09-2018  @[facility]    arthroscopy and quadriceps tendon graft  . CHOLECYSTECTOMY OPEN  4098   AND UMBILICIAL HERNIA REPAIR AND ABDOMINOPLASTY  . CYSTOSCOPY W/ URETERAL STENT PLACEMENT Left 05/31/2020   Procedure: CYSTOSCOPY WITH RETROGRADE PYELOGRAM/URETERAL STENT PLACEMENT;  Surgeon: Janith Lima, MD;  Location: [facility];  Service: Urology;  Laterality: Left;  . CYSTOSCOPY/RETROGRADE/URETEROSCOPY/STONE EXTRACTION WITH BASKET Right 02/25/2020   Procedure: CYSTOSCOPY RETROGRADE/ URETEROSCOPY/STONE EXTRACTION WITH BASKET/URETERAL STENT PLACEMENT ;  Surgeon: Cleon Gustin, MD;  Location: [facility];  Service: Urology;  Laterality: Right;  . CYSTOSCOPY/RETROGRADE/URETEROSCOPY/STONE EXTRACTION WITH BASKET  2012  . CYSTOSCOPY/RETROGRADE/URETEROSCOPY/STONE EXTRACTION WITH BASKET Left 06/11/2020   Procedure: CYSTOSCOPY/RETROGRADE/URETEROSCOPY/ LASER LITHOTRIPSY/ STONE EXTRACTION WITH BASKET/ LEFT STENT EXCHANGE;  Surgeon: Janith Lima, MD;  Location: [facility];  Service: Urology;  Laterality: Left;  ONLY NEEDS 45 MIN  . KNEE ARTHROSCOPY W/ ACL RECONSTRUCTION Left 2002  . KNEE ARTHROSCOPY W/ SYNOVECTOMY Left 07-15-2018  @[facility]    lysis adhesions  . KNEE SURGERY Left 2004 and 2017   revision scar tissue  . LEFT HEART CATH AND CORONARY ANGIOGRAPHY N/A 03/17/2017   Procedure: LEFT HEART CATH AND CORONARY ANGIOGRAPHY;  Surgeon: Burnell Blanks, MD;  Location: [facility];  Service: Cardiovascular;  Laterality: N/A;  . LUMBAR SPINE SURGERY  07-15-2019  @[facility]    L4--5 disectomy decompression;  L5--S1 laminectomy  . PELVIC LAPAROSCOPY  1992   for endometriosis  . POSTERIOR LUMBAR FUSION  12-20-2019  @[facility]    L4--5 revision laminectomy and fusion  . TOTAL LAPAROSCOPIC HYSTERECTOMY WITH BILATERAL SALPINGO OOPHORECTOMY  02/26/2016   HPI:  Patient is a 50 y.o. female with PMH: HTN, HLD, recurrent UTI, generalized anxiety disorder, migraines, DDD, peptic ulcer, GERD who presented to  the hospital with acute cystits after presenting from home to [facility] ED c/o dizziness/lightheadedness which worsened when rising from seated or standing position. On 4/22 in late PM, she had incident where she felt she could not breathe after taking sip of water and feeling that she was having  spasming in throat, with duration of incident lasting 4-5 seconds and resolving completely.   Assessment / Plan / Recommendation Clinical Impression  Patient presents with an oropharyngeal swallow that appears WNL. 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 Patient did report that she has had chest pain and feelings of esophageal spasming further down but this was the first time it occured at level of her throat. She denied having any difficulty with increased spasming of esophagus when drinking cold drinks, but she did state that when the incident occured yesterday afternoon, the water she drank had been cold. Patient reports h/o taking 32m Protonix twice daily but that she still does have reflux symptoms, such as feeling of burning in throat. SLP is recommending patient monitor for any more occurances of esophageal or laryngeal spasming and consult with her PCP. SLP Visit Diagnosis: Dysphagia, unspecified (R13.10)    Aspiration Risk  No limitations    Diet Recommendation Regular;Thin liquid   Liquid Administration via: Straw;Cup Medication Administration: Whole meds with liquid Supervision: Patient able to self feed Postural Changes: Remain upright for at least 30 minutes after po intake    Other  Recommendations Oral Care Recommendations: Oral care BID   Follow up Recommendations None;Other (comment)      Frequency and Duration   N/A         Prognosis   N/A     Swallow Study   General Date of Onset: 11/29/20 HPI: Patient is a 50y.o. female with PMH: HTN, HLD, recurrent UTI, generalized anxiety disorder, migraines, DDD, peptic ulcer, GERD who presented to the hospital with acute cystits [facility]  d[facility]/lightheadedness which worsened when rising from seated or standing position. On 4/22 in late PM, she had incident where she felt she could not breathe after taking sip of water and feeling that she was having spasming in throat, with duration of incident lasting 4-5 seconds and resolving completely. Type of Study: Bedside Swallow Evaluation Previous Swallow Assessment: None found Diet Prior to this Study: Regular;Thin liquids Temperature Spikes Noted: No Respiratory Status: Room air History of Recent Intubation: No Behavior/Cognition: Alert;Cooperative;Pleasant mood Oral Cavity Assessment: Within Functional Limits Oral Care Completed by SLP: No Oral Cavity - Dentition: Adequate natural dentition Vision: Functional for self-feeding Self-Feeding Abilities: Able to feed self Patient Positioning: Upright in bed Baseline Vocal Quality: Normal Volitional Cough: Strong Volitional Swallow: Able to elicit    Oral/Motor/Sensory Function Overall Oral Motor/Sensory Function: Within functional limits   Ice Chips Ice chips: Not tested   Thin Liquid Thin Liquid: Within functional limits    Nectar Thick     Honey Thick     Puree Puree: Not tested   Solid     Solid: Not tested      JSonia Baller MA, CCC-SLP Speech Therapy

## 2020-12-01 NOTE — Discharge Summary (Signed)
Physician Discharge Summary  Dandrea Medders ZHY:[id number] DOB: 1971/03/14 DOA: 11/29/2020  PCP: Elenore Paddy, NP  Admit date: 11/29/2020 Discharge date: 12/01/2020  Admitted From: Home Disposition: Home  Recommendations for Outpatient Follow-up:  1. Follow up with PCP in 1-2 weeks 2. Follow-up with Urology within 1 to 2 weeks 3. Follow-up with Rheumatology in 1 to 2 weeks 4. Please obtain CMP/CBC, Mag, Phos in one week 5. Please follow up on the following pending results:  Home Health: No Equipment/Devices: None   Discharge Condition: Stable  CODE STATUS: FULL CODE  Diet recommendation: Heart Healthy   Brief/Interim Summary: Patient is a 50 year old Caucasian female with a past medical history significant for but not limited to hypertension, hyperlipidemia, recurrent urinary tract infections, generalized anxiety disorder as well as ankylosing spondylitis and other comorbidities who presented to [facility] complaining of dizziness.  She reported 1 day of dizziness and lightheadedness worsening when rising from a seated to standing position.  Blood pressure was checked and noted to be in the 70 systolic and denies any recent changes to her home antihypertensive medications.  She has been experiencing 4 days of dysuria, leading diagnosis of UTI as an outpatient on 419 which she was prescribed ciprofloxacin.  She states that she was able to take 2 doses of Cipro and had compliance but then started having the dizziness.  She notes a decline in oral intake that is slight but denied any nausea, vomiting diarrhea.  Continues to complain of some lower abdominal discomfort that is intermittent and crampy in nature and also complains of some burning.  She attributes these type of symptoms similar to her previous UTIs.  She was brought to the ED and admitted with sepsis secondary to urinary tract infection.  In the ED she was given acetaminophen, IV Rocephin and 2 L of lactated Ringer boluses.   Currently she is being treated for and evaluated for the management of sepsis secondary UTI and orthostatic hypotension  She improved with IV hydration and she was given another 1 L bolus this morning.  She is not orthostatic on repeat vital signs.  She improved significantly and was deemed stable to be discharged and her antibiotics were changed to p.o. cefdinir at discharge for total 7 days.  Unfortunately urine culture <10,000 CFU of insignificant growth. Her AKI resolved and she was deemed stable to D/C.   Discharge Diagnoses:  Principal Problem:   Acute cystitis Active Problems:   GERD (gastroesophageal reflux disease)   Hyperlipidemia   Hypotension   AKI (acute kidney injury) ([place])   Anxiety  Sepsis 2/2 Acute Cystitis/UTI poA, improving -Diagnosis on the basis of 4 days of dysuria, with persistence of symptoms in spite of 2 days of outpatient ciprofloxacin, with presenting urinalysis still demonstrating evidence of significant pyuria. -Of note, patient does not meet SIRS criteria to be considered septic at this time.  -CT abdomen/pelvis showed no evidence of pyelonephritis but did show bilateral nonobstructive nephrolithiasis without any evidence of any associated hydronephrosis -Consequently, patient's urinary tract infection appears most consistent with acute cystitis.  -Blood cultures x2 were collected prior to initiation of Rocephin in the ED. -Urinalysis done and showed a hazy appearance with moderate leukocytes, negative nitrates, few bacteria seen, 0-5 squamous epithelial cells, 0-5 RBCs per high-power field, 21-50 WBCs and urine culture showed less than 10,000 colony-forming units of insignificant growth and blood cultures show no growth to date less than 1 day -C/w Empiric Abx with IV Ceftriaxone 1 g q24h and  changed to p.o. cefdinir -Patient received 2 Liters of LR boluses and placed on maintenance IVF with LR at 125 mL/hr; will give an additional 1 L normal saline bolus  today -Follow Temperature Curve and WBC Trend  and this is stable and improved  Orthostatic Hypotension -Feels dizzy when rising from a seated to standing position -Blood pressure noted to be on the lower side and she has acute renal failure and likely in setting of dehydration from UTI -We will need orthostatic vital signs in the morning and she was not orthostatic -IV fluid hydration as above and given another 1 L bolus -We will obtain PT OT evaluation if not improving  Hypotension but has a Hx of HTN -Holding Antihypertensives of Amlodipine-Valsartan 10-320 mg po Daily and will reduce to 5-1 60 daily -Given another 1 Liter Bolus again today -Continue to Monitor BP per Protocol  -Last BP was  136/91  GERD/Hx of Peptic Ulcer -C/w Pantoprazole 40 mg po BID  Hyperlipidemia -C/w Pravastatin 20 mg po Daily  Anxiety/GAD and Depression -C/w Duloxetine 60 mg po Daily and Escitaloparm 20 mg po Daily   Migraines -Takes [facility]relvy at [facility] but not on hospital formulary; Will substitute with Sumatriptan until patient can bring in Home Supply of Ubrogepant 50-100 mg po Daily Migraine  -Follow-up with neurology in the outpatient setting  Ankylosing Spondylitis/DDD/OA  -Takes Adalimumab 40 mg into the skin q14 Days and will hold for now and defer to her primary rheumatologist to resume -Judicious use of Oxycodone-Acetaminophen 1 tab po C1ULAG   AKI Metabolic Acidosis -Patient's BUN/Cr went from 31/2.37 on Admission and is now improved to 29/1.93 -> 17/0.96 -Urine Total Protein was 25, Protein/Cr Ratio was 0.20, Urine Na+ was 35 and Ur Cr was 124.89 -C/w  -Has a Small acidosis with a CO2 of 21, AG of 8, Chloride Level of 112 -Strict I's and O's and Daily Weights  -Avoid Nephrotoxic Medications, Contrast Dyes, Hypotension, and Renally Adjust Medications -Continue to Monitor and Trend Renal Fxn and Repeat CMP in the AM  Hypokalemia -Patient's potassium this morning was 3.2 -Replete  with p.o. KCl 40 mg twice daily x2 doses Continue monitor and replete as necessary next-mag level was 1.8 so this was also replete with IV mag sulfate 2 g  -repeat CMP in outpatient setting  Normocytic Anemia, Unclear Etiology -Patient's Hgb/Hct went from 11.9/38.3 -> 11.1/35.5 -> 11.2/36.0 -Check Anemia Panel as an outpatient  -Continue to Monitor for S/Sx of Bleeding; Currently no overt bleeding noted -Repeat CBC within 1 week  Discharge Instructions   Allergies as of 12/01/2020      Reactions   Nsaids Other (See Comments)   Ulcers   Sulfa Antibiotics Other (See Comments)   Pt states "I bleed from my body orficese when I take sulfa drugs"   Sulfasalazine Other (See Comments)   Pt states "I bleed from my body orficese when I take sulfa drugs"      Medication List    STOP taking these medications   Adderall XR 20 MG 24 hr capsule Generic drug: amphetamine-dextroamphetamine   amLODipine-valsartan 10-320 MG tablet Commonly known as: EXFORGE Replaced by: amLODipine-valsartan 5-160 MG tablet   ciprofloxacin 500 MG tablet Commonly known as: CIPRO     TAKE these medications   Adalimumab 40 MG/0.8ML Pnkt Inject 40 mg into the skin every 14 (fourteen) days.   amLODipine-valsartan 5-160 MG tablet Commonly known as: Exforge Take 1 tablet by mouth daily. Start taking on: December 02, 2020 Replaces:  amLODipine-valsartan 10-320 MG tablet   cefdinir 300 MG capsule Commonly known as: OMNICEF Take 1 capsule (300 mg total) by mouth every 12 (twelve) hours for 9 doses.   CoQ-10 100 MG capsule Take 100 mg by mouth daily.   docusate sodium 250 MG capsule Commonly known as: COLACE Take 250 mg by mouth at bedtime.   DULoxetine 60 MG capsule Commonly known as: CYMBALTA Take 60 mg by mouth daily.   escitalopram 20 MG tablet Commonly known as: LEXAPRO Take 20 mg by mouth daily.   MULTIVITAMIN ADULT PO Take 1 tablet by mouth at bedtime.   Omega-3 1000 MG Caps Take 1,000 mg by  mouth at bedtime.   ondansetron 4 MG tablet Commonly known as: Zofran Take 1 tablet (4 mg total) by mouth every 8 (eight) hours as needed for nausea or vomiting (nausea/vomiting). What changed:   medication strength  how much to take  reasons to take this   oxyCODONE-acetaminophen 5-325 MG tablet Commonly known as: Percocet Take 1 tablet by mouth every 4 (four) hours as needed for up to 6 doses for severe pain.   pantoprazole 40 MG tablet Commonly known as: PROTONIX Take 40 mg by mouth 2 (two) times daily.   pravastatin 20 MG tablet Commonly known as: PRAVACHOL Take 20 mg by mouth daily.   PROBIOTIC DAILY PO Take 1 capsule by mouth daily. 62 billion   QC TUMERIC COMPLEX PO Take 144 mg by mouth daily.   Red Yeast Rice Extract 600 MG Caps Take 600 mg by mouth at bedtime.   tiZANidine 4 MG capsule Commonly known as: ZANAFLEX Take 4 mg by mouth 3 (three) times daily as needed for muscle spasms.   topiramate 50 MG tablet Commonly known as: TOPAMAX Take 50 mg by mouth 2 (two) times daily.   Ubrelvy 50 MG Tabs Generic drug: Ubrogepant Take by mouth. 1 tablet as needed may repeat in 2 hours if needed for migraines       Follow-up Information    Elenore Paddy, NP. Call.   Specialty: Nurse Practitioner Why: Follow up within 1-2 weeks Contact information: [place] [address] [phone]        Janith Lima, MD. Call.   Specialty: Urology Why: Follow up within 1-2 weeks Contact information: [address] [place] [address] [phone]              Allergies  Allergen Reactions  . Nsaids Other (See Comments)    Ulcers  . Sulfa Antibiotics Other (See Comments)    Pt states "I bleed from my body orficese when I take sulfa drugs"  . Sulfasalazine Other (See Comments)    Pt states "I bleed from my body orficese when I take sulfa drugs"   Consultations:  None  Procedures/Studies: CT ABDOMEN PELVIS WO CONTRAST  Result Date:  11/29/2020 CLINICAL DATA:  Left lower quadrant abdominal pain, dizziness, recently diagnosed with UTI EXAM: CT ABDOMEN AND PELVIS WITHOUT CONTRAST TECHNIQUE: Multidetector CT imaging of the abdomen and pelvis was performed following the standard protocol without IV contrast. COMPARISON:  Renal ultrasound 07/11/2020 FINDINGS: Lower chest: Lung bases are clear. Normal heart size. No pericardial effusion. Hepatobiliary: No visible worrisome focal liver abnormality is seen within the limitations of this unenhanced CT. Normal hepatic attenuation. Smooth liver surface contour. Prior cholecystectomy without significant biliary ductal dilatation or visible intraductal gallstone. Pancreas: No pancreatic ductal dilatation or surrounding inflammatory changes. Spleen: Normal in size. No concerning splenic lesions.  Adrenals/Urinary Tract: Normal adrenals. Kidneys are symmetric in size and normally located. Small fluid attenuation parapelvic cyst in the lower pole right kidney measuring approximately 2 cm in size. No concerning visible or contour deforming renal lesions. Bilateral nonobstructive nephrolithiasis. No frank hydronephrosis or obstructive urolith. Urinary bladder is unremarkable. Stomach/Bowel: Distal esophagus, stomach and duodenal sweep are unremarkable. No small bowel wall thickening or dilatation. No evidence of obstruction. A normal appendix is visualized. No colonic dilatation or wall thickening. Vascular/Lymphatic: Atherosclerotic calcifications within the abdominal aorta and branch vessels. No aneurysm or ectasia. No enlarged abdominopelvic lymph nodes. Reproductive: Urinary bladder is unremarkable. Other: No abdominal wall hernia or abnormality. No abdominopelvic ascites. Musculoskeletal: L4-5 PLIF without acute hardware complication. No acute osseous abnormality or suspicious osseous lesion. Musculature is normal and symmetric. IMPRESSION: 1. No acute intra-abdominal process to provide cause for patient's  symptoms. 2. Bilateral nonobstructive nephrolithiasis. No obstructive uropathy, hydronephrosis or other acute urinary tract abnormality. 3. Prior cholecystectomy. 4. L4-5 PLIF without acute hardware complication. 5. Aortic Atherosclerosis (ICD10-I70.0). Electronically Signed   By: Lovena Le M.D.   On: 11/29/2020 23:19   CT Head Wo Contrast  Result Date: 11/29/2020 CLINICAL DATA:  Headaches and dizziness EXAM: CT HEAD WITHOUT CONTRAST TECHNIQUE: Contiguous axial images were obtained from the base of the skull through the vertex without intravenous contrast. COMPARISON:  01/05/2019 FINDINGS: Brain: No evidence of acute infarction, hemorrhage, hydrocephalus, extra-axial collection or mass lesion/mass effect. Vascular: No hyperdense vessel or unexpected calcification. Skull: Normal. Negative for fracture or focal lesion. Sinuses/Orbits: No acute finding. Other: None. IMPRESSION: No acute intracranial abnormality noted. Electronically Signed   By: Inez Catalina M.D.   On: 11/29/2020 23:15   CT Cervical Spine Wo Contrast  Result Date: 11/24/2020 CLINICAL DATA:  Right shoulder surgery in January left shoulder surgery in March. Status post fall EXAM: CT CERVICAL SPINE WITHOUT CONTRAST TECHNIQUE: Multidetector CT imaging of the cervical spine was performed without intravenous contrast. Multiplanar CT image reconstructions were also generated. COMPARISON:  X-ray cervical spine 05/29/2016 FINDINGS: Alignment: Straightening of the normal cervical lordosis. Skull base and vertebrae: No acute fracture. No aggressive appearing focal osseous lesion or focal pathologic process. Soft tissues and spinal canal: No prevertebral fluid or swelling. No visible canal hematoma. Upper chest: Unremarkable. Other: None. IMPRESSION: No acute displaced fracture or traumatic listhesis of the cervical spine. Electronically Signed   By: Iven Finn M.D.   On: 11/24/2020 18:38   DG Chest Portable 1 View  Result Date:  11/29/2020 CLINICAL DATA:  Recent syncopal episode EXAM: PORTABLE CHEST 1 VIEW COMPARISON:  01/05/2019 FINDINGS: The heart size and mediastinal contours are within normal limits. Both lungs are clear. The visualized skeletal structures are unremarkable. IMPRESSION: No active disease. Electronically Signed   By: Inez Catalina M.D.   On: 11/29/2020 21:45     Subjective: Seen and examined at bedside and she is doing much better.  Still had some mild abdominal discomfort but improving.  Headache is almost gone as well.  Denies any chest pain, shortness of breath or lightheadedness or dizziness.  Was not orthostatic vital signs were taken.  All questions are answered to her and her husband satisfaction.  No other concerns or complaints at this time and she is stable to be discharged home.  Discharge Exam: Vitals:   12/01/20 0536 12/01/20 1234  BP: 115/81 127/84  Pulse: 71 60  Resp: 16 19  Temp: 98.1 F (36.7 C) 98.7 F (37.1 C)  SpO2: 100% 100%  Vitals:   11/30/20 2125 12/01/20 0536 12/01/20 0627 12/01/20 1234  BP: 128/89 115/81  127/84  Pulse: 72 71  60  Resp: 18 16  19   Temp: 98 F (36.7 C) 98.1 F (36.7 C)  98.7 F (37.1 C)  TempSrc: Oral Oral  Oral  SpO2: 100% 100%  100%  Weight:   66.5 kg   Height:       General: Pt is alert, awake, not in acute distress Cardiovascular: RRR, S1/S2 +, no rubs, no gallops Respiratory: CTA bilaterally, no wheezing, no rhonchi Abdominal: Soft, Mildly tender in the lower abdomen Slightly distended, bowel sounds + Extremities: no edema, no cyanosis  The results of significant diagnostics from this hospitalization (including imaging, microbiology, ancillary and laboratory) are listed below for reference.    Microbiology: Recent Results (from the past 240 hour(s))  Blood culture (routine x 2)     Status: None (Preliminary result)   Collection Time: 11/29/20  9:30 PM   Specimen: BLOOD  Result Value Ref Range Status   Specimen Description   Final     BLOOD LEFT ANTECUBITAL Performed at [facility] [address] [place], [place] [address]    Special Requests   Final    BOTTLES DRAWN AEROBIC AND ANAEROBIC Blood Culture adequate volume Performed at [facility] [address], [place], [place] [address]    Culture   Final    NO GROWTH 1 DAY Performed at [facility], [place] [address] [place], [place] [address]    Report Status PENDING  Incomplete  Blood culture (routine x 2)     Status: None (Preliminary result)   Collection Time: 11/29/20  9:30 PM   Specimen: BLOOD RIGHT FOREARM  Result Value Ref Range Status   Specimen Description   Final    BLOOD RIGHT FOREARM Performed at [facility] [address] [place], [place] [address]    Special Requests   Final    BOTTLES DRAWN AEROBIC AND ANAEROBIC Blood Culture adequate volume Performed at [facility] [address] [place], [place] [address]    Culture   Final    NO GROWTH 1 DAY Performed at [facility], [place] [address] [place], [place] [address]    Report Status PENDING  Incomplete  Culture, Urine     Status: Abnormal   Collection Time: 11/29/20  9:30 PM   Specimen: Urine, Clean Catch  Result Value Ref Range Status   Specimen Description   Final    URINE, CLEAN CATCH Performed at [facility], [place] [address], [place], [place] [address]    Special Requests   Final    NONE Performed at [facility], [place] [address] [place], [place] [address]    Culture (A)  Final    <10,000 COLONIES/mL INSIGNIFICANT GROWTH Performed at [facility] [address], [place],  [address]    Report Status 12/01/2020 FINAL  Final  Resp Panel by RT-PCR (Flu A&B, Covid) Nasopharyngeal Swab     Status: None   Collection Time: 11/29/20 11:37 PM   Specimen: Nasopharyngeal Swab; Nasopharyngeal(NP) swabs in vial transport medium  Result Value Ref  Range Status   SARS Coronavirus 2 by RT PCR NEGATIVE NEGATIVE Final    Comment: (NOTE) SARS-CoV-2 target nucleic acids are NOT DETECTED.  The SARS-CoV-2 RNA is generally detectable in upper respiratory specimens during the acute phase of infection. The lowest concentration of  SARS-CoV-2 viral copies this assay can detect is 138 copies/mL. A negative result does not preclude SARS-Cov-2 infection and should not be used as the sole basis for treatment or other patient management decisions. A negative result may occur with  improper specimen collection/handling, submission of specimen other than nasopharyngeal swab, presence of viral mutation(s) within the areas targeted by this assay, and inadequate number of viral copies(<138 copies/mL). A negative result must be combined with clinical observations, patient history, and epidemiological information. The expected result is Negative.  Fact Sheet for Patients:  EntrepreneurPulse.com.au  Fact Sheet for Healthcare Providers:  IncredibleEmployment.be  This test is no t yet approved or cleared by the [place] FDA and  has been authorized for detection and/or diagnosis of SARS-CoV-2 by FDA under an Emergency Use Authorization (EUA). This EUA will remain  in effect (meaning this test can be used) for the duration of the COVID-19 declaration under Section 564(b)(1) of the Act, 21 U.S.C.section 360bbb-3(b)(1), unless the authorization is terminated  or revoked sooner.       Influenza A by PCR NEGATIVE NEGATIVE Final   Influenza B by PCR NEGATIVE NEGATIVE Final    Comment: (NOTE) The Xpert Xpress SARS-CoV-2/FLU/RSV plus assay is intended as an aid in the diagnosis of influenza from Nasopharyngeal swab specimens and should not be used as a sole basis for treatment. Nasal washings and aspirates are unacceptable for Xpert Xpress SARS-CoV-2/FLU/RSV testing.  Fact Sheet for  Patients: EntrepreneurPulse.com.au  Fact Sheet for Healthcare Providers: IncredibleEmployment.be  This test is not yet approved or cleared by the [place] FDA and has been authorized for detection and/or diagnosis of SARS-CoV-2 by FDA under an Emergency Use Authorization (EUA). This EUA will remain in effect (meaning this test can be used) for the duration of the COVID-19 declaration under Section 564(b)(1) of the Act, 21 U.S.C. section 360bbb-3(b)(1), unless the authorization is terminated or revoked.  Performed at [facility], [place] [address] [place], [place] [address]      Labs: BNP (last 3 results) No results for input(s): BNP in the last 8760 hours. Basic Metabolic Panel: Recent Labs  Lab 11/24/20 1712 11/29/20 2057 11/30/20 0500 12/01/20 0435  NA 141 139 141 142  K 3.6 4.3 3.6 3.2*  CL 110 108 112* 110  CO2 22 21* 21* 22  GLUCOSE 108* 96 95 79  BUN 15 31* 29* 17  CREATININE 1.01* 2.37* 1.93* 0.96  CALCIUM 9.9 9.9 9.3 9.1  MG  --   --  1.8 1.8  PHOS  --   --  4.5 3.3   Liver Function Tests: Recent Labs  Lab 11/29/20 2057 11/30/20 0500 12/01/20 0435  AST 14* 17 17  ALT 11 10 10   ALKPHOS 57 52 55  BILITOT 0.4 0.5 0.3  PROT 7.1 6.1* 6.6  ALBUMIN 4.0 3.5 3.5   Recent Labs  Lab 11/29/20 2057  LIPASE 49   No results for input(s): AMMONIA in the last 168 hours. CBC: Recent Labs  Lab 11/24/20 1712 11/29/20 2057 11/30/20 0500 12/01/20 0435  WBC 12.6* 7.8 7.7 5.9  NEUTROABS 8.9* 4.8 5.1 3.1  HGB 13.8 11.9* 11.1* 11.2*  HCT 42.7 38.3 35.5* 36.0  MCV 88.8 90.5 89.9 91.1  PLT 412* 328 248 274   Cardiac Enzymes: No results for input(s): CKTOTAL, CKMB, CKMBINDEX, TROPONINI in the last 168 hours. BNP: Invalid input(s): POCBNP CBG: No results for input(s): GLUCAP in the last 168 hours. D-Dimer No results for input(s): DDIMER in the  last 72 hours. Hgb A1c No results for input(s): HGBA1C in  the last 72 hours. Lipid Profile No results for input(s): CHOL, HDL, LDLCALC, TRIG, CHOLHDL, LDLDIRECT in the last 72 hours. Thyroid function studies Recent Labs    11/29/20 2125  TSH 2.218   Anemia work up No results for input(s): VITAMINB12, FOLATE, FERRITIN, TIBC, IRON, RETICCTPCT in the last 72 hours. Urinalysis    Component Value Date/Time   COLORURINE YELLOW 11/29/2020 2130   APPEARANCEUR HAZY (A) 11/29/2020 2130   LABSPEC 1.011 11/29/2020 2130   PHURINE 5.0 11/29/2020 2130   GLUCOSEU NEGATIVE 11/29/2020 2130   HGBUR NEGATIVE 11/29/2020 2130   BILIRUBINUR NEGATIVE 11/29/2020 2130   [place] NEGATIVE 11/29/2020 2130   PROTEINUR 30 (A) 11/29/2020 2130   NITRITE NEGATIVE 11/29/2020 2130   LEUKOCYTESUR MODERATE (A) 11/29/2020 2130   Sepsis Labs Invalid input(s): PROCALCITONIN,  WBC,  LACTICIDVEN Microbiology Recent Results (from the past 240 hour(s))  Blood culture (routine x 2)     Status: None (Preliminary result)   Collection Time: 11/29/20  9:30 PM   Specimen: BLOOD  Result Value Ref Range Status   Specimen Description   Final    BLOOD LEFT ANTECUBITAL Performed at [facility], [place] [address], [place], [place] [address]    Special Requests   Final    BOTTLES DRAWN AEROBIC AND ANAEROBIC Blood Culture adequate volume Performed at [facility] [address], [place], [place] [address]    Culture   Final    NO GROWTH 1 DAY Performed at [facility], [place] [address], [place], [place] [address]    Report Status PENDING  Incomplete  Blood culture (routine x 2)     Status: None (Preliminary result)   Collection Time: 11/29/20  9:30 PM   Specimen: BLOOD RIGHT FOREARM  Result Value Ref Range Status   Specimen Description   Final    BLOOD RIGHT FOREARM Performed at [facility] [address], [place], [place] [address]    Special Requests   Final    BOTTLES DRAWN AEROBIC AND ANAEROBIC Blood Culture  adequate volume Performed at [facility] [address] [place], [place] [address]    Culture   Final    NO GROWTH 1 DAY Performed at [facility], [place] [address], [place], [place] [address]    Report Status PENDING  Incomplete  Culture, Urine     Status: Abnormal   Collection Time: 11/29/20  9:30 PM   Specimen: Urine, Clean Catch  Result Value Ref Range Status   Specimen Description   Final    URINE, CLEAN CATCH Performed at [facility], [place] [address] [place], [place] [address]    Special Requests   Final    NONE Performed at [facility], [place] [address] [place], [place] [address]    Culture (A)  Final    <10,000 COLONIES/mL INSIGNIFICANT GROWTH Performed at [facility] [address], [place], [place] [address]    Report Status 12/01/2020 FINAL  Final  Resp Panel by RT-PCR (Flu A&B, Covid) Nasopharyngeal Swab     Status: None   Collection Time: 11/29/20 11:37 PM   Specimen: Nasopharyngeal Swab; Nasopharyngeal(NP) swabs in vial transport medium  Result Value Ref Range Status   SARS Coronavirus 2 by RT PCR NEGATIVE NEGATIVE Final    Comment: (NOTE) SARS-CoV-2 target nucleic acids are NOT DETECTED.  The SARS-CoV-2 RNA is  generally detectable in upper respiratory specimens during the acute phase of infection. The lowest concentration of SARS-CoV-2 viral copies this assay can detect is 138 copies/mL. A negative result does not preclude SARS-Cov-2 infection and should not be used as the sole basis for treatment or other patient management decisions. A negative result may occur with  improper specimen collection/handling, submission of specimen other than nasopharyngeal swab, presence of viral mutation(s) within the areas targeted by this assay, and inadequate number of viral copies(<138 copies/mL). A negative result must be combined with clinical observations, patient history, and  epidemiological information. The expected result is Negative.  Fact Sheet for Patients:  EntrepreneurPulse.com.au  Fact Sheet for Healthcare Providers:  IncredibleEmployment.be  This test is no t yet approved or cleared by the [place] FDA and  has been authorized for detection and/or diagnosis of SARS-CoV-2 by FDA under an Emergency Use Authorization (EUA). This EUA will remain  in effect (meaning this test can be used) for the duration of the COVID-19 declaration under Section 564(b)(1) of the Act, 21 U.S.C.section 360bbb-3(b)(1), unless the authorization is terminated  or revoked sooner.       Influenza A by PCR NEGATIVE NEGATIVE Final   Influenza B by PCR NEGATIVE NEGATIVE Final    Comment: (NOTE) The Xpert Xpress SARS-CoV-2/FLU/RSV plus assay is intended as an aid in the diagnosis of influenza from Nasopharyngeal swab specimens and should not be used as a sole basis for treatment. Nasal washings and aspirates are unacceptable for Xpert Xpress SARS-CoV-2/FLU/RSV testing.  Fact Sheet for Patients: EntrepreneurPulse.com.au  Fact Sheet for Healthcare Providers: IncredibleEmployment.be  This test is not yet approved or cleared by the [place] FDA and has been authorized for detection and/or diagnosis of SARS-CoV-2 by FDA under an Emergency Use Authorization (EUA). This EUA will remain in effect (meaning this test can be used) for the duration of the COVID-19 declaration under Section 564(b)(1) of the Act, 21 U.S.C. section 360bbb-3(b)(1), unless the authorization is terminated or revoked.  Performed at [facility], [place] [address], [place], [place] [address]    Time coordinating discharge: 35 minutes  SIGNED:  Kerney Elbe, DO [facility] 12/01/2020, 12:39 PM Pager is on [place]  If 7PM-7AM, please contact night-coverage [email or web address]

## 2020-12-04 DIAGNOSIS — M791 Myalgia, unspecified site: Secondary | ICD-10-CM | POA: Diagnosis not present

## 2020-12-04 DIAGNOSIS — Z6820 Body mass index (BMI) 20.0-20.9, adult: Secondary | ICD-10-CM | POA: Diagnosis not present

## 2020-12-04 DIAGNOSIS — M4326 Fusion of spine, lumbar region: Secondary | ICD-10-CM | POA: Diagnosis not present

## 2020-12-04 DIAGNOSIS — M542 Cervicalgia: Secondary | ICD-10-CM | POA: Diagnosis not present

## 2020-12-05 LAB — CULTURE, BLOOD (ROUTINE X 2)
Culture: NO GROWTH
Culture: NO GROWTH
Special Requests: ADEQUATE
Special Requests: ADEQUATE

## 2020-12-06 DIAGNOSIS — N3000 Acute cystitis without hematuria: Secondary | ICD-10-CM | POA: Diagnosis not present

## 2020-12-06 DIAGNOSIS — N200 Calculus of kidney: Secondary | ICD-10-CM | POA: Diagnosis not present

## 2020-12-06 DIAGNOSIS — Z9889 Other specified postprocedural states: Secondary | ICD-10-CM | POA: Diagnosis not present

## 2020-12-11 DIAGNOSIS — G540 Brachial plexus disorders: Secondary | ICD-10-CM | POA: Diagnosis not present

## 2020-12-11 DIAGNOSIS — Z9889 Other specified postprocedural states: Secondary | ICD-10-CM | POA: Diagnosis not present

## 2020-12-11 DIAGNOSIS — M6281 Muscle weakness (generalized): Secondary | ICD-10-CM | POA: Diagnosis not present

## 2020-12-14 DIAGNOSIS — G540 Brachial plexus disorders: Secondary | ICD-10-CM | POA: Diagnosis not present

## 2020-12-14 DIAGNOSIS — M6281 Muscle weakness (generalized): Secondary | ICD-10-CM | POA: Diagnosis not present

## 2020-12-14 DIAGNOSIS — Z9889 Other specified postprocedural states: Secondary | ICD-10-CM | POA: Diagnosis not present

## 2020-12-19 DIAGNOSIS — G540 Brachial plexus disorders: Secondary | ICD-10-CM | POA: Diagnosis not present

## 2020-12-19 DIAGNOSIS — Z9889 Other specified postprocedural states: Secondary | ICD-10-CM | POA: Diagnosis not present

## 2020-12-19 DIAGNOSIS — M6281 Muscle weakness (generalized): Secondary | ICD-10-CM | POA: Diagnosis not present

## 2020-12-21 DIAGNOSIS — G540 Brachial plexus disorders: Secondary | ICD-10-CM | POA: Diagnosis not present

## 2020-12-21 DIAGNOSIS — Z9889 Other specified postprocedural states: Secondary | ICD-10-CM | POA: Diagnosis not present

## 2020-12-21 DIAGNOSIS — M6281 Muscle weakness (generalized): Secondary | ICD-10-CM | POA: Diagnosis not present

## 2020-12-25 DIAGNOSIS — G540 Brachial plexus disorders: Secondary | ICD-10-CM | POA: Diagnosis not present

## 2020-12-25 DIAGNOSIS — M6281 Muscle weakness (generalized): Secondary | ICD-10-CM | POA: Diagnosis not present

## 2020-12-25 DIAGNOSIS — Z9889 Other specified postprocedural states: Secondary | ICD-10-CM | POA: Diagnosis not present

## 2020-12-26 DIAGNOSIS — N3000 Acute cystitis without hematuria: Secondary | ICD-10-CM | POA: Diagnosis not present

## 2020-12-26 DIAGNOSIS — Z9889 Other specified postprocedural states: Secondary | ICD-10-CM | POA: Diagnosis not present

## 2020-12-26 DIAGNOSIS — M6281 Muscle weakness (generalized): Secondary | ICD-10-CM | POA: Diagnosis not present

## 2020-12-26 DIAGNOSIS — G540 Brachial plexus disorders: Secondary | ICD-10-CM | POA: Diagnosis not present

## 2020-12-27 DIAGNOSIS — M25511 Pain in right shoulder: Secondary | ICD-10-CM | POA: Diagnosis not present

## 2020-12-27 DIAGNOSIS — M25512 Pain in left shoulder: Secondary | ICD-10-CM | POA: Diagnosis not present

## 2020-12-27 DIAGNOSIS — M450 Ankylosing spondylitis of multiple sites in spine: Secondary | ICD-10-CM | POA: Diagnosis not present

## 2020-12-27 DIAGNOSIS — M2550 Pain in unspecified joint: Secondary | ICD-10-CM | POA: Diagnosis not present

## 2021-01-01 DIAGNOSIS — G540 Brachial plexus disorders: Secondary | ICD-10-CM | POA: Diagnosis not present

## 2021-01-01 DIAGNOSIS — Z9889 Other specified postprocedural states: Secondary | ICD-10-CM | POA: Diagnosis not present

## 2021-01-01 DIAGNOSIS — M6281 Muscle weakness (generalized): Secondary | ICD-10-CM | POA: Diagnosis not present

## 2021-01-03 DIAGNOSIS — M6281 Muscle weakness (generalized): Secondary | ICD-10-CM | POA: Diagnosis not present

## 2021-01-03 DIAGNOSIS — Z9889 Other specified postprocedural states: Secondary | ICD-10-CM | POA: Diagnosis not present

## 2021-01-03 DIAGNOSIS — G540 Brachial plexus disorders: Secondary | ICD-10-CM | POA: Diagnosis not present

## 2021-01-08 DIAGNOSIS — M6281 Muscle weakness (generalized): Secondary | ICD-10-CM | POA: Diagnosis not present

## 2021-01-08 DIAGNOSIS — Z9889 Other specified postprocedural states: Secondary | ICD-10-CM | POA: Diagnosis not present

## 2021-01-08 DIAGNOSIS — G540 Brachial plexus disorders: Secondary | ICD-10-CM | POA: Diagnosis not present

## 2021-01-15 DIAGNOSIS — M6281 Muscle weakness (generalized): Secondary | ICD-10-CM | POA: Diagnosis not present

## 2021-01-15 DIAGNOSIS — S161XXD Strain of muscle, fascia and tendon at neck level, subsequent encounter: Secondary | ICD-10-CM | POA: Diagnosis not present

## 2021-01-15 DIAGNOSIS — M62830 Muscle spasm of back: Secondary | ICD-10-CM | POA: Diagnosis not present

## 2021-01-15 DIAGNOSIS — S39012D Strain of muscle, fascia and tendon of lower back, subsequent encounter: Secondary | ICD-10-CM | POA: Diagnosis not present

## 2021-01-16 DIAGNOSIS — G43909 Migraine, unspecified, not intractable, without status migrainosus: Secondary | ICD-10-CM | POA: Diagnosis not present

## 2021-01-16 DIAGNOSIS — F419 Anxiety disorder, unspecified: Secondary | ICD-10-CM | POA: Diagnosis not present

## 2021-01-16 DIAGNOSIS — I10 Essential (primary) hypertension: Secondary | ICD-10-CM | POA: Diagnosis not present

## 2021-01-16 DIAGNOSIS — M797 Fibromyalgia: Secondary | ICD-10-CM | POA: Diagnosis not present

## 2021-01-17 DIAGNOSIS — Z9889 Other specified postprocedural states: Secondary | ICD-10-CM | POA: Diagnosis not present

## 2021-01-17 DIAGNOSIS — M67912 Unspecified disorder of synovium and tendon, left shoulder: Secondary | ICD-10-CM | POA: Diagnosis not present

## 2021-01-17 DIAGNOSIS — G540 Brachial plexus disorders: Secondary | ICD-10-CM | POA: Diagnosis not present

## 2021-01-17 DIAGNOSIS — M6281 Muscle weakness (generalized): Secondary | ICD-10-CM | POA: Diagnosis not present

## 2021-01-23 DIAGNOSIS — Z6820 Body mass index (BMI) 20.0-20.9, adult: Secondary | ICD-10-CM | POA: Diagnosis not present

## 2021-01-23 DIAGNOSIS — M1612 Unilateral primary osteoarthritis, left hip: Secondary | ICD-10-CM | POA: Diagnosis not present

## 2021-01-23 DIAGNOSIS — M4326 Fusion of spine, lumbar region: Secondary | ICD-10-CM | POA: Diagnosis not present

## 2021-01-25 DIAGNOSIS — G540 Brachial plexus disorders: Secondary | ICD-10-CM | POA: Diagnosis not present

## 2021-01-25 DIAGNOSIS — M6281 Muscle weakness (generalized): Secondary | ICD-10-CM | POA: Diagnosis not present

## 2021-01-25 DIAGNOSIS — Z9889 Other specified postprocedural states: Secondary | ICD-10-CM | POA: Diagnosis not present

## 2021-01-29 DIAGNOSIS — G540 Brachial plexus disorders: Secondary | ICD-10-CM | POA: Diagnosis not present

## 2021-01-29 DIAGNOSIS — Z9889 Other specified postprocedural states: Secondary | ICD-10-CM | POA: Diagnosis not present

## 2021-01-29 DIAGNOSIS — M6281 Muscle weakness (generalized): Secondary | ICD-10-CM | POA: Diagnosis not present

## 2021-02-01 DIAGNOSIS — Z9889 Other specified postprocedural states: Secondary | ICD-10-CM | POA: Diagnosis not present

## 2021-02-01 DIAGNOSIS — M6281 Muscle weakness (generalized): Secondary | ICD-10-CM | POA: Diagnosis not present

## 2021-02-01 DIAGNOSIS — G540 Brachial plexus disorders: Secondary | ICD-10-CM | POA: Diagnosis not present

## 2021-02-08 DIAGNOSIS — S39012D Strain of muscle, fascia and tendon of lower back, subsequent encounter: Secondary | ICD-10-CM | POA: Diagnosis not present

## 2021-02-08 DIAGNOSIS — S161XXD Strain of muscle, fascia and tendon at neck level, subsequent encounter: Secondary | ICD-10-CM | POA: Diagnosis not present

## 2021-02-08 DIAGNOSIS — Z9889 Other specified postprocedural states: Secondary | ICD-10-CM | POA: Diagnosis not present

## 2021-02-08 DIAGNOSIS — M62830 Muscle spasm of back: Secondary | ICD-10-CM | POA: Diagnosis not present

## 2021-02-08 DIAGNOSIS — G540 Brachial plexus disorders: Secondary | ICD-10-CM | POA: Diagnosis not present

## 2021-02-08 DIAGNOSIS — M6281 Muscle weakness (generalized): Secondary | ICD-10-CM | POA: Diagnosis not present

## 2021-02-12 DIAGNOSIS — S39012D Strain of muscle, fascia and tendon of lower back, subsequent encounter: Secondary | ICD-10-CM | POA: Diagnosis not present

## 2021-02-12 DIAGNOSIS — G540 Brachial plexus disorders: Secondary | ICD-10-CM | POA: Diagnosis not present

## 2021-02-12 DIAGNOSIS — M6281 Muscle weakness (generalized): Secondary | ICD-10-CM | POA: Diagnosis not present

## 2021-02-12 DIAGNOSIS — Z9889 Other specified postprocedural states: Secondary | ICD-10-CM | POA: Diagnosis not present

## 2021-02-14 DIAGNOSIS — Z6824 Body mass index (BMI) 24.0-24.9, adult: Secondary | ICD-10-CM | POA: Diagnosis not present

## 2021-02-14 DIAGNOSIS — M1612 Unilateral primary osteoarthritis, left hip: Secondary | ICD-10-CM | POA: Diagnosis not present

## 2021-02-14 DIAGNOSIS — M4326 Fusion of spine, lumbar region: Secondary | ICD-10-CM | POA: Diagnosis not present

## 2021-02-14 DIAGNOSIS — Z9889 Other specified postprocedural states: Secondary | ICD-10-CM | POA: Diagnosis not present

## 2021-02-14 DIAGNOSIS — G540 Brachial plexus disorders: Secondary | ICD-10-CM | POA: Diagnosis not present

## 2021-02-14 DIAGNOSIS — M6281 Muscle weakness (generalized): Secondary | ICD-10-CM | POA: Diagnosis not present

## 2021-02-14 DIAGNOSIS — M461 Sacroiliitis, not elsewhere classified: Secondary | ICD-10-CM | POA: Diagnosis not present

## 2021-02-18 ENCOUNTER — Encounter: Payer: Self-pay | Admitting: Neurology

## 2021-02-18 ENCOUNTER — Ambulatory Visit (INDEPENDENT_AMBULATORY_CARE_PROVIDER_SITE_OTHER): Payer: BC Managed Care – PPO | Admitting: Neurology

## 2021-02-18 VITALS — BP 109/82 | HR 79 | Ht 63.0 in | Wt 143.0 lb

## 2021-02-18 DIAGNOSIS — R519 Headache, unspecified: Secondary | ICD-10-CM | POA: Diagnosis not present

## 2021-02-18 DIAGNOSIS — M62830 Muscle spasm of back: Secondary | ICD-10-CM | POA: Diagnosis not present

## 2021-02-18 DIAGNOSIS — G43709 Chronic migraine without aura, not intractable, without status migrainosus: Secondary | ICD-10-CM

## 2021-02-18 DIAGNOSIS — H547 Unspecified visual loss: Secondary | ICD-10-CM | POA: Diagnosis not present

## 2021-02-18 DIAGNOSIS — R51 Headache with orthostatic component, not elsewhere classified: Secondary | ICD-10-CM

## 2021-02-18 DIAGNOSIS — M6281 Muscle weakness (generalized): Secondary | ICD-10-CM | POA: Diagnosis not present

## 2021-02-18 DIAGNOSIS — S161XXD Strain of muscle, fascia and tendon at neck level, subsequent encounter: Secondary | ICD-10-CM | POA: Diagnosis not present

## 2021-02-18 DIAGNOSIS — S39012D Strain of muscle, fascia and tendon of lower back, subsequent encounter: Secondary | ICD-10-CM | POA: Diagnosis not present

## 2021-02-18 MED ORDER — AJOVY 225 MG/1.5ML ~~LOC~~ SOAJ: 225.0000 mg | SUBCUTANEOUS | 11 refills | Status: DC

## 2021-02-18 NOTE — Progress Notes (Signed)
[facility]    Provider:  Dr Jaynee Eagles Requesting Provider: Elenore Paddy, NP Primary Care Provider:  Elenore Paddy, NP  CC:  Migraines, chronic pain  HPI:  Anna Wilson is a 50 y.o. female here as requested by Elenore Paddy, NP for headache. PMHx ankylosing spondylitis, anxiety, carpal tunnel syndrome, degenerative disc disease lumbosacral, frequency of urination, history of kidney stones, recurrent UTIs, hypertension, migraines, osteoarthritis, peptic ulcer, hyperlipidemia, hypertension, osteoarthritis, renal calculus in the left, migraines, adhd, AKI, anxiety.   Migraines since the age of 35. Also half-sister. And her child who is 20 starting to have them. She started seeing a neurologist at the age of 89 until her 58s, she also went [facility], and again saw a neurologist in 2016 for left arm pain in [place]. Since then just seeing primary care and using imitrex. Topamax was prescribed and the Topamax started helping but she is having issues with kidney stones. She has a spine doctor. She has brain fog, the words are not there. She also has fibromyalgia. She hs a headache "all the time", she has a daily chronic headache, her husband is here and provides much information as well, he says she snores, she wake often at night. Wakes up with morning headaches, she is extremely fatigue, she naps every day 3-4 hours long. Migraines come on the left, pulsating/pounding/throbbing, acute treatment like caffeine can help. A dark room helps, nausea and vomiting, pounding/pulsating and throbbing, sensitivity of skin, sleeping helps, can last 24 hours, moderate to severe, 12-15 moderately severe severe to severe migraine days a month for > 4 months. She has had trigger point injections and dry needling, she has associated neck pain and follow with a spine doctor. Vision changes, vision loss.   Reviewed notes, labs and imaging from outside physicians, which showed:  I  reviewed notes from requesting provider, patient was seen in the emergency room, she just had bilateral shoulder/carpal tunnel surgeries, on 327 she was walking up the stairs at her house when she stumbled, fell forward and tumbled down the stairs, no loss of consciousness but noted increased neck and shoulder pain, she has an orthopedist, she continues to have moderate aching right-sided neck and right-sided migraine symptoms, she completed a course of steroids about a week ago and is continue to take tizanidine and Norco without improvement, when she is lying on her left side it feels like a water balloon is pressing on her neck, pain is not associated with fever, nausea or vomiting, she has not had tingling in the arms or legs.  I reviewed physical examination which was normal including head ears eyes nose throat cardiovascular pulmonary and neurologic.  I reviewed labs BMP was unremarkable, creatinine 1.01, CBC did show elevated white blood cells otherwise unremarkable.  CT of the cervical spine showed nothing pathologic, no acute displaced fracture or traumatic listhesis of the cervical spine.  She was given Toradol and Reglan diagnosed with musculoskeletal neck pain and exacerbation of her migraines.    CT head/4/22:  showed No acute intracranial abnormalities including mass lesion or mass effect, hydrocephalus, extra-axial fluid collection, midline shift, hemorrhage, or acute infarction, large ischemic events (personally reviewed images)   From a thorough review of records, medications tried that can be used in migraine management include: Tylenol, amlodipine (calcium channel blocker in the same class as verapamil close parentheses, valsartan, aspirin, Decadron injection, Benadryl injections, Cymbalta, Lexapro, gabapentin, Toradol injections, Skelaxin, metoprolol, naproxen, ondansetron tablets and injections, topiramate  is contraindicated due to history of nephrolithiasis, Phenergan injections and  oral, scopolamine patches, amitriptyline, Imitrex, tizanidine, she has tried Topamax and is currently on Topamax, Ubrelvy.  Tsh normal  Review of Systems: Patient complains of symptoms per HPI as well as the following symptoms chronic pain. Pertinent negatives and positives per HPI. All others negative.   Social History   Socioeconomic History   Marital status: Married    Spouse name: Not on file   Number of children: Not on file   Years of education: Not on file   Highest education level: Not on file  Occupational History   Not on file  Tobacco Use   Smoking status: Former    Years: 6.00    Pack years: 0.00    Types: Cigarettes    Quit date: 06/07/1994    Years since quitting: 26.7   Smokeless tobacco: Never  Vaping Use   Vaping Use: Never used  Substance and Sexual Activity   Alcohol use: Yes    Comment: occasional   Drug use: No   Sexual activity: Not on file  Other Topics Concern   Not on file  Social History Narrative   Not on file   Social Determinants of Health   Financial Resource Strain: Not on file  Food Insecurity: Not on file  Transportation Needs: Not on file  Physical Activity: Not on file  Stress: Not on file  Social Connections: Not on file  Intimate Partner Violence: Not on file    Family History  Problem Relation Age of Onset   Hypertension Mother    Hypertension Father    Hypothyroidism Brother    Rheum arthritis Daughter    Asthma Daughter    Hypothyroidism Brother    Asthma Son     Past Medical History:  Diagnosis Date   Ankylosing spondylitis ([place])    rhemotology--- dr Amil Amen,  treated with humira injection   Anxiety    Carpal tunnel syndrome, bilateral    DDD (degenerative disc disease), lumbosacral    Frequency of urination    History of 2019 novel coronavirus disease (COVID-19) 03/24/2020   per pt had mild symptoms that resolved in 10 days and lingering cough resolved few weeks later   History of chest pain    in epic  was referred to cardiology, dr Johnsie Cancel (note 01-28-2017) by pt's pcp;  previous nuclear stress test negative for ischemia ef 69% (results in care everywhere 12-22-2016) and normal echo results 08-13-2014 in care everywhere;   pt had cardiac cath @[facility]  03-17-2017 (in epic) no angiographic evidence of CAD and LVEF 65%, non-cardiac chest pain   History of esophageal spasm    per pt hx chest pain from to no be non-cardiac but esophageal spasm and takes protonix which pt stated no spasm's taking protonix   History of kidney stones    History of peptic ulcer 2016   History of recurrent UTIs    Hyperlipidemia    Hypertension    followed by pcp   Migraines    followed by pcp   OA (osteoarthritis)    Renal calculus, left    Wears glasses     Patient Active Problem List   Diagnosis Date Noted   Chronic migraine without aura without status migrainosus, not intractable 02/18/2021   Hypotension 11/30/2020   AKI (acute kidney injury) ([place]) 11/30/2020   Anxiety    Acute cystitis 11/29/2020   Ankylosing spondylitis ([place]) 05/31/2020   Attention deficit hyperactivity disorder (ADHD) 05/31/2020  Benign essential HTN 02/24/2020   Nephrolithiasis 02/24/2020   GERD (gastroesophageal reflux disease) 02/24/2020   Hyperlipidemia 02/24/2020   Pyelonephritis 02/24/2020   Precordial pain     Past Surgical History:  Procedure Laterality Date   ANTERIOR CRUCIATE LIGAMENT (ACL) REVISION Left 03-09-2018  @[facility]    arthroscopy and quadriceps tendon graft   capel tunnel Bilateral 2022   wrist   CHOLECYSTECTOMY OPEN  1950   AND UMBILICIAL HERNIA REPAIR AND ABDOMINOPLASTY   CYSTOSCOPY W/ URETERAL STENT PLACEMENT Left 05/31/2020   Procedure: CYSTOSCOPY WITH RETROGRADE PYELOGRAM/URETERAL STENT PLACEMENT;  Surgeon: Janith Lima, MD;  Location: [facility];  Service: Urology;  Laterality: Left;   CYSTOSCOPY/RETROGRADE/URETEROSCOPY/STONE EXTRACTION WITH BASKET Right 02/25/2020   Procedure: CYSTOSCOPY RETROGRADE/  URETEROSCOPY/STONE EXTRACTION WITH BASKET/URETERAL STENT PLACEMENT ;  Surgeon: Cleon Gustin, MD;  Location: [facility];  Service: Urology;  Laterality: Right;   CYSTOSCOPY/RETROGRADE/URETEROSCOPY/STONE EXTRACTION WITH BASKET  2012   CYSTOSCOPY/RETROGRADE/URETEROSCOPY/STONE EXTRACTION WITH BASKET Left 06/11/2020   Procedure: CYSTOSCOPY/RETROGRADE/URETEROSCOPY/ LASER LITHOTRIPSY/ STONE EXTRACTION WITH BASKET/ LEFT STENT EXCHANGE;  Surgeon: Janith Lima, MD;  Location: [facility];  Service: Urology;  Laterality: Left;  ONLY NEEDS 45 MIN   KNEE ARTHROSCOPY W/ ACL RECONSTRUCTION Left 2002   KNEE ARTHROSCOPY W/ SYNOVECTOMY Left 07-15-2018  @[facility]    lysis adhesions   KNEE SURGERY Left 2004 and 2017   revision scar tissue   LEFT HEART CATH AND CORONARY ANGIOGRAPHY N/A 03/17/2017   Procedure: LEFT HEART CATH AND CORONARY ANGIOGRAPHY;  Surgeon: Burnell Blanks, MD;  Location: [facility];  Service: Cardiovascular;  Laterality: N/A;   LUMBAR SPINE SURGERY  07-15-2019  @[facility]    L4--5 disectomy decompression;  L5--S1 laminectomy   PELVIC LAPAROSCOPY  1992   for endometriosis   POSTERIOR LUMBAR FUSION  12-20-2019  @[facility]    L4--5 revision laminectomy and fusion   SHOULDER ARTHROSCOPY Bilateral 2022   TOTAL LAPAROSCOPIC HYSTERECTOMY WITH BILATERAL SALPINGO OOPHORECTOMY  02/26/2016    Current Outpatient Medications  Medication Sig Dispense Refill   amLODipine-valsartan (EXFORGE) 10-320 MG tablet Take 1 tablet by mouth daily.     Coenzyme Q10 (COQ-10) 100 MG capsule Take 100 mg by mouth daily.     docusate sodium (COLACE) 250 MG capsule Take 250 mg by mouth at bedtime.     DULoxetine (CYMBALTA) 60 MG capsule Take 60 mg by mouth daily.     escitalopram (LEXAPRO) 20 MG tablet Take 20 mg by mouth daily.     Fremanezumab-vfrm (AJOVY) 225 MG/1.5ML SOAJ Inject 225 mg into the skin every 30 (thirty) days. 1.5 mL 11   HUMIRA PEN 40 MG/0.4ML PNKT SMARTSIG:40 Milligram(s) SUB-Q  Every 2 Weeks     Multiple Vitamin (MULTIVITAMIN ADULT PO) Take 1 tablet by mouth at bedtime.      Multiple Vitamin (MULTIVITAMIN) tablet Take 1 tablet by mouth daily.     nitrofurantoin, macrocrystal-monohydrate, (MACROBID) 100 MG capsule Take 100 mg by mouth at bedtime.     Omega-3 1000 MG CAPS Take 1,000 mg by mouth at bedtime.      ondansetron (ZOFRAN) 4 MG tablet Take 1 tablet (4 mg total) by mouth every 8 (eight) hours as needed for nausea or vomiting (nausea/vomiting). 20 tablet 0   oxyCODONE-acetaminophen (PERCOCET) 5-325 MG tablet Take 1 tablet by mouth every 4 (four) hours as needed for up to 6 doses for severe pain. 6 tablet 0   pantoprazole (PROTONIX) 40 MG tablet Take 40 mg by mouth 2 (two) times daily.  pravastatin (PRAVACHOL) 20 MG tablet Take 20 mg by mouth daily.     Probiotic Product (PROBIOTIC DAILY PO) Take 1 capsule by mouth daily. 62 billion     Red Yeast Rice Extract 600 MG CAPS Take 600 mg by mouth at bedtime.     tiZANidine (ZANAFLEX) 4 MG capsule Take 4 mg by mouth 3 (three) times daily as needed for muscle spasms.     topiramate (TOPAMAX) 50 MG tablet Take 50 mg by mouth 2 (two) times daily.     Turmeric (QC TUMERIC COMPLEX PO) Take 144 mg by mouth daily.     UBRELVY 50 MG TABS Take by mouth. 1 tablet as needed may repeat in 2 hours if needed for migraines     No current facility-administered medications for this visit.    Allergies as of 02/18/2021 - Review Complete 02/18/2021  Allergen Reaction Noted   Nsaids Other (See Comments) 06/01/2016   Sulfa antibiotics Other (See Comments) 06/01/2016   Sulfasalazine Other (See Comments) 06/01/2016    Vitals: BP 109/82   Pulse 79   Ht 5' 3"  (1.6 m)   Wt 143 lb (64.9 kg)   LMP  (LMP Unknown)   BMI 25.33 kg/m  Last Weight:  Wt Readings from Last 1 Encounters:  02/18/21 143 lb (64.9 kg)   Last Height:   Ht Readings from Last 1 Encounters:  02/18/21 5' 3"  (1.6 m)     Physical exam: Exam: Gen: NAD,  conversant, well nourised, obese, well groomed                     CV: RRR, no MRG. No Carotid Bruits. No peripheral edema, warm, nontender Eyes: Conjunctivae clear without exudates or hemorrhage  Neuro: Detailed Neurologic Exam  Speech:    Speech is normal; fluent and spontaneous with normal comprehension.  Cognition:    The patient is oriented to person, place, and time;     recent and remote memory intact;     language fluent;     normal attention, concentration,     fund of knowledge Cranial Nerves:    The pupils are equal, round, and reactive to light. The fundi are normal and spontaneous venous pulsations are present. Visual fields are full to finger confrontation. Extraocular movements are intact. Trigeminal sensation is intact and the muscles of mastication are normal. The face is symmetric. The palate elevates in the midline. Hearing intact. Voice is normal. Shoulder shrug is normal. The tongue has normal motion without fasciculations.   Coordination:    Normal finger to nose and heel to shin. Normal rapid alternating movements.   Gait:    Heel-toe and tandem gait are normal.   Motor Observation:    No asymmetry, no atrophy, and no involuntary movements noted. Tone:    Normal muscle tone.    Posture:    Posture is normal. normal erect    Strength:    Strength is V/V in the upper and lower limbs.      Sensation: intact to LT     Reflex Exam:  DTR's:    Deep tendon reflexes in the upper and lower extremities are normal bilaterally.   Toes:    The toes are downgoing bilaterally.   Clonus:    Clonus is absent.    Assessment/Plan:  50 y.o. female here as requested by Elenore Paddy, NP for headache. PMHx ankylosing spondylitis, anxiety, carpal tunnel syndrome, degenerative disc disease lumbosacral, frequency of urination, history of kidney  stones, recurrent UTIs, hypertension, migraines, osteoarthritis, peptic ulcer, hyperlipidemia, hypertension, osteoarthritis,  renal calculus in the left, migraines, adhd, AKI, anxiety.  Start Ajovy for migraine prevention.  Continue Ubrelvy for acute managements  MRI brain due to concerning symptoms of morning headaches, positional headaches,vision changes  to look for space occupying mass, chiari or intracranial hypertension (pseudotumor).  Musculoskeletal/myofascial neck pain: She has had trigger point injections and dry needling, she has associated neck pain and follow with a spine doctor. Continue to follow with spine doctor. Migraines can be associated with msk neck pain, will treat migraines and see if this also improved.   Memory/brain fog: May be medications or chronic pain(fibromyalgia). Also we will be stopping topamax. Will monitor and see if it improved off of the topamax and with improved migraines and headaches  Sleep study: She has a headache "all the time", she has a daily chronic headache, her husband is here and provides much information as well, he says she snores, she wakes often at night. Wakes up with morning headaches, she is extremely fatigue, she naps every day 3-4 hours long.  Can consider MRI brain, CT of the head was normal recently.   Orders Placed This Encounter  Procedures   MR BRAIN W WO CONTRAST    Meds ordered this encounter  Medications   Fremanezumab-vfrm (AJOVY) 225 MG/1.5ML SOAJ    Sig: Inject 225 mg into the skin every 30 (thirty) days.    Dispense:  1.5 mL    Refill:  11    Patient has copay card; she can have medication regardless of insurance approval or copay amount.     Cc: Elenore Paddy, NP,  Elenore Paddy, NP  Sarina Ill, MD  [facility] [address] [place], [place] [address]  Phone [phone] Fax [phone]

## 2021-02-18 NOTE — Patient Instructions (Signed)
Start Ajovy MRI of the brain Sleep evaluation  Fremanezumab injection What is this medication? FREMANEZUMAB (fre ma NEZ ue mab) is used to prevent migraine headaches. This medicine may be used for other purposes; ask your health care provider orpharmacist if you have questions. COMMON BRAND NAME(S): AJOVY What should I tell my care team before I take this medication? They need to know if you have any of these conditions: an unusual or allergic reaction to fremanezumab, other medicines, foods, dyes, or preservatives pregnant or trying to get pregnant breast-feeding How should I use this medication? This medicine is for injection under the skin. You will be taught how to prepare and give this medicine. Use exactly as directed. Take your medicine atregular intervals. Do not take your medicine more often than directed. It is important that you put your used needles and syringes in a special sharps container. Do not put them in a trash can. If you do not have a sharpscontainer, call your pharmacist or healthcare provider to get one. Talk to your pediatrician regarding the use of this medicine in children.Special care may be needed. Overdosage: If you think you have taken too much of this medicine contact apoison control center or emergency room at once. NOTE: This medicine is only for you. Do not share this medicine with others. What if I miss a dose? If you miss a dose, take it as soon as you can. If it is almost time for yournext dose, take only that dose. Do not take double or extra doses. What may interact with this medication? Interactions are not expected. This list may not describe all possible interactions. Give your health care provider a list of all the medicines, herbs, non-prescription drugs, or dietary supplements you use. Also tell them if you smoke, drink alcohol, or use illegaldrugs. Some items may interact with your medicine. What should I watch for while using this  medication? Tell your doctor or healthcare professional if your symptoms do not start toget better or if they get worse. What side effects may I notice from receiving this medication? Side effects that you should report to your doctor or health care professionalas soon as possible: allergic reactions like skin rash, itching or hives, swelling of the face, lips, or tongue Side effects that usually do not require medical attention (report these toyour doctor or health care professional if they continue or are bothersome): pain, redness, or irritation at site where injected This list may not describe all possible side effects. Call your doctor for medical advice about side effects. You may report side effects to FDA at1-800-FDA-1088. Where should I keep my medication? Keep out of the reach of children. You will be instructed on how to store this medicine. Throw away any unusedmedicine after the expiration date on the label. NOTE: This sheet is a summary. It may not cover all possible information. If you have questions about this medicine, talk to your doctor, pharmacist, orhealth care provider.  2022 Elsevier/Gold Standard (2017-04-27 17:22:56)

## 2021-02-19 DIAGNOSIS — M25512 Pain in left shoulder: Secondary | ICD-10-CM | POA: Diagnosis not present

## 2021-02-25 DIAGNOSIS — Z9889 Other specified postprocedural states: Secondary | ICD-10-CM | POA: Diagnosis not present

## 2021-02-25 DIAGNOSIS — G540 Brachial plexus disorders: Secondary | ICD-10-CM | POA: Diagnosis not present

## 2021-02-25 DIAGNOSIS — M6281 Muscle weakness (generalized): Secondary | ICD-10-CM | POA: Diagnosis not present

## 2021-02-27 DIAGNOSIS — M6281 Muscle weakness (generalized): Secondary | ICD-10-CM | POA: Diagnosis not present

## 2021-02-27 DIAGNOSIS — Z9889 Other specified postprocedural states: Secondary | ICD-10-CM | POA: Diagnosis not present

## 2021-02-27 DIAGNOSIS — G540 Brachial plexus disorders: Secondary | ICD-10-CM | POA: Diagnosis not present

## 2021-03-04 DIAGNOSIS — G540 Brachial plexus disorders: Secondary | ICD-10-CM | POA: Diagnosis not present

## 2021-03-04 DIAGNOSIS — M6281 Muscle weakness (generalized): Secondary | ICD-10-CM | POA: Diagnosis not present

## 2021-03-04 DIAGNOSIS — Z9889 Other specified postprocedural states: Secondary | ICD-10-CM | POA: Diagnosis not present

## 2021-03-06 ENCOUNTER — Other Ambulatory Visit: Payer: Self-pay

## 2021-03-06 ENCOUNTER — Ambulatory Visit
Admission: RE | Admit: 2021-03-06 | Discharge: 2021-03-06 | Disposition: A | Discharge status: Home / Self Care | Payer: BC Managed Care – PPO | Source: Ambulatory Visit | Attending: Neurology | Admitting: Neurology

## 2021-03-06 DIAGNOSIS — M1612 Unilateral primary osteoarthritis, left hip: Secondary | ICD-10-CM | POA: Diagnosis not present

## 2021-03-06 DIAGNOSIS — R51 Headache with orthostatic component, not elsewhere classified: Secondary | ICD-10-CM

## 2021-03-06 DIAGNOSIS — H547 Unspecified visual loss: Secondary | ICD-10-CM

## 2021-03-06 DIAGNOSIS — R519 Headache, unspecified: Secondary | ICD-10-CM | POA: Diagnosis not present

## 2021-03-06 MED ORDER — GADOBENATE DIMEGLUMINE 529 MG/ML IV SOLN
13.0000 mL | Freq: Once | INTRAVENOUS | Status: AC | PRN
  Administered 2021-03-06: 13 mL via INTRAVENOUS

## 2021-03-13 DIAGNOSIS — G540 Brachial plexus disorders: Secondary | ICD-10-CM | POA: Diagnosis not present

## 2021-03-13 DIAGNOSIS — M6281 Muscle weakness (generalized): Secondary | ICD-10-CM | POA: Diagnosis not present

## 2021-03-13 DIAGNOSIS — Z9889 Other specified postprocedural states: Secondary | ICD-10-CM | POA: Diagnosis not present

## 2021-03-18 DIAGNOSIS — M79641 Pain in right hand: Secondary | ICD-10-CM | POA: Diagnosis not present

## 2021-03-18 DIAGNOSIS — G540 Brachial plexus disorders: Secondary | ICD-10-CM | POA: Diagnosis not present

## 2021-03-18 DIAGNOSIS — M6281 Muscle weakness (generalized): Secondary | ICD-10-CM | POA: Diagnosis not present

## 2021-03-18 DIAGNOSIS — M79642 Pain in left hand: Secondary | ICD-10-CM | POA: Diagnosis not present

## 2021-03-18 DIAGNOSIS — Z9889 Other specified postprocedural states: Secondary | ICD-10-CM | POA: Diagnosis not present

## 2021-03-19 ENCOUNTER — Encounter: Payer: Self-pay | Admitting: *Deleted

## 2021-03-19 DIAGNOSIS — R519 Headache, unspecified: Secondary | ICD-10-CM

## 2021-03-19 DIAGNOSIS — R0683 Snoring: Secondary | ICD-10-CM

## 2021-03-19 DIAGNOSIS — R5383 Other fatigue: Secondary | ICD-10-CM

## 2021-03-20 DIAGNOSIS — M4326 Fusion of spine, lumbar region: Secondary | ICD-10-CM | POA: Diagnosis not present

## 2021-03-20 DIAGNOSIS — G5603 Carpal tunnel syndrome, bilateral upper limbs: Secondary | ICD-10-CM | POA: Diagnosis not present

## 2021-03-20 DIAGNOSIS — M461 Sacroiliitis, not elsewhere classified: Secondary | ICD-10-CM | POA: Diagnosis not present

## 2021-03-20 DIAGNOSIS — M79643 Pain in unspecified hand: Secondary | ICD-10-CM | POA: Diagnosis not present

## 2021-03-20 DIAGNOSIS — S6412XD Injury of median nerve at wrist and hand level of left arm, subsequent encounter: Secondary | ICD-10-CM | POA: Diagnosis not present

## 2021-03-20 DIAGNOSIS — M1612 Unilateral primary osteoarthritis, left hip: Secondary | ICD-10-CM | POA: Diagnosis not present

## 2021-03-20 DIAGNOSIS — M542 Cervicalgia: Secondary | ICD-10-CM | POA: Diagnosis not present

## 2021-03-21 ENCOUNTER — Telehealth: Payer: Self-pay | Admitting: *Deleted

## 2021-03-21 NOTE — Telephone Encounter (Signed)
I faxed FMLA form and office note to [employer] on 03/21/21. Pt credit card was not charged.

## 2021-03-21 NOTE — Telephone Encounter (Signed)
Dr Jaynee Eagles wrote note on disability form stating patient has only been seen once and Dr Jaynee Eagles cannot make any of those (restrictions, etc) determinations. Form signed, copy made, and form sent to medical records for processing. Please refund patient.

## 2021-03-21 NOTE — Telephone Encounter (Signed)
Great - thanks

## 2021-03-21 NOTE — Telephone Encounter (Signed)
The form received is an attending provider's statement for her disability request. I called the patient to further inquire as we have not taken her out of work.  The patient stated she has been out of work since September 2020.  I kindly advised that whenever initially took her out is the one who should continue her disability and she stated they were however she states the [employer] is having all of her doctor's fill out the statement forms. She is also applying for social security disability as well and the [employer] is helping her with that.  I did kindly advised that Dr. Jaynee Eagles does not provide disability for migraines but I will reviewed this form with her.  The patient verbalized appreciation.  She also stated that she had not heard anything about a sleep study yet.  Needs referral to sleep.

## 2021-03-25 DIAGNOSIS — S6412XD Injury of median nerve at wrist and hand level of left arm, subsequent encounter: Secondary | ICD-10-CM | POA: Diagnosis not present

## 2021-03-25 DIAGNOSIS — M79643 Pain in unspecified hand: Secondary | ICD-10-CM | POA: Diagnosis not present

## 2021-03-25 DIAGNOSIS — S6411XD Injury of median nerve at wrist and hand level of right arm, subsequent encounter: Secondary | ICD-10-CM | POA: Diagnosis not present

## 2021-03-25 DIAGNOSIS — G5603 Carpal tunnel syndrome, bilateral upper limbs: Secondary | ICD-10-CM | POA: Diagnosis not present

## 2021-03-27 DIAGNOSIS — S6411XD Injury of median nerve at wrist and hand level of right arm, subsequent encounter: Secondary | ICD-10-CM | POA: Diagnosis not present

## 2021-03-27 DIAGNOSIS — S6412XD Injury of median nerve at wrist and hand level of left arm, subsequent encounter: Secondary | ICD-10-CM | POA: Diagnosis not present

## 2021-03-27 DIAGNOSIS — G5603 Carpal tunnel syndrome, bilateral upper limbs: Secondary | ICD-10-CM | POA: Diagnosis not present

## 2021-03-27 DIAGNOSIS — M79643 Pain in unspecified hand: Secondary | ICD-10-CM | POA: Diagnosis not present

## 2021-04-01 DIAGNOSIS — G5603 Carpal tunnel syndrome, bilateral upper limbs: Secondary | ICD-10-CM | POA: Diagnosis not present

## 2021-04-01 DIAGNOSIS — R200 Anesthesia of skin: Secondary | ICD-10-CM | POA: Diagnosis not present

## 2021-04-01 DIAGNOSIS — S6412XD Injury of median nerve at wrist and hand level of left arm, subsequent encounter: Secondary | ICD-10-CM | POA: Diagnosis not present

## 2021-04-01 DIAGNOSIS — M79643 Pain in unspecified hand: Secondary | ICD-10-CM | POA: Diagnosis not present

## 2021-04-01 DIAGNOSIS — S6411XD Injury of median nerve at wrist and hand level of right arm, subsequent encounter: Secondary | ICD-10-CM | POA: Diagnosis not present

## 2021-04-02 DIAGNOSIS — M4326 Fusion of spine, lumbar region: Secondary | ICD-10-CM | POA: Diagnosis not present

## 2021-04-02 DIAGNOSIS — N200 Calculus of kidney: Secondary | ICD-10-CM | POA: Diagnosis not present

## 2021-04-02 DIAGNOSIS — M5416 Radiculopathy, lumbar region: Secondary | ICD-10-CM | POA: Diagnosis not present

## 2021-04-02 DIAGNOSIS — N201 Calculus of ureter: Secondary | ICD-10-CM | POA: Diagnosis not present

## 2021-04-02 DIAGNOSIS — M1612 Unilateral primary osteoarthritis, left hip: Secondary | ICD-10-CM | POA: Diagnosis not present

## 2021-04-04 DIAGNOSIS — S6412XD Injury of median nerve at wrist and hand level of left arm, subsequent encounter: Secondary | ICD-10-CM | POA: Diagnosis not present

## 2021-04-04 DIAGNOSIS — S6411XD Injury of median nerve at wrist and hand level of right arm, subsequent encounter: Secondary | ICD-10-CM | POA: Diagnosis not present

## 2021-04-04 DIAGNOSIS — G5603 Carpal tunnel syndrome, bilateral upper limbs: Secondary | ICD-10-CM | POA: Diagnosis not present

## 2021-04-04 DIAGNOSIS — M79643 Pain in unspecified hand: Secondary | ICD-10-CM | POA: Diagnosis not present

## 2021-04-08 DIAGNOSIS — G5603 Carpal tunnel syndrome, bilateral upper limbs: Secondary | ICD-10-CM | POA: Diagnosis not present

## 2021-04-08 DIAGNOSIS — S6411XD Injury of median nerve at wrist and hand level of right arm, subsequent encounter: Secondary | ICD-10-CM | POA: Diagnosis not present

## 2021-04-08 DIAGNOSIS — S6412XD Injury of median nerve at wrist and hand level of left arm, subsequent encounter: Secondary | ICD-10-CM | POA: Diagnosis not present

## 2021-04-08 DIAGNOSIS — M79643 Pain in unspecified hand: Secondary | ICD-10-CM | POA: Diagnosis not present

## 2021-04-11 DIAGNOSIS — S6412XD Injury of median nerve at wrist and hand level of left arm, subsequent encounter: Secondary | ICD-10-CM | POA: Diagnosis not present

## 2021-04-11 DIAGNOSIS — M79643 Pain in unspecified hand: Secondary | ICD-10-CM | POA: Diagnosis not present

## 2021-04-11 DIAGNOSIS — G5603 Carpal tunnel syndrome, bilateral upper limbs: Secondary | ICD-10-CM | POA: Diagnosis not present

## 2021-04-11 DIAGNOSIS — S6411XD Injury of median nerve at wrist and hand level of right arm, subsequent encounter: Secondary | ICD-10-CM | POA: Diagnosis not present

## 2021-04-16 DIAGNOSIS — G5603 Carpal tunnel syndrome, bilateral upper limbs: Secondary | ICD-10-CM | POA: Diagnosis not present

## 2021-04-16 DIAGNOSIS — S6412XD Injury of median nerve at wrist and hand level of left arm, subsequent encounter: Secondary | ICD-10-CM | POA: Diagnosis not present

## 2021-04-16 DIAGNOSIS — G5602 Carpal tunnel syndrome, left upper limb: Secondary | ICD-10-CM | POA: Diagnosis not present

## 2021-04-16 DIAGNOSIS — M25532 Pain in left wrist: Secondary | ICD-10-CM | POA: Diagnosis not present

## 2021-04-16 DIAGNOSIS — M67912 Unspecified disorder of synovium and tendon, left shoulder: Secondary | ICD-10-CM | POA: Diagnosis not present

## 2021-04-16 DIAGNOSIS — S6411XD Injury of median nerve at wrist and hand level of right arm, subsequent encounter: Secondary | ICD-10-CM | POA: Diagnosis not present

## 2021-04-16 DIAGNOSIS — M79643 Pain in unspecified hand: Secondary | ICD-10-CM | POA: Diagnosis not present

## 2021-04-18 DIAGNOSIS — M1612 Unilateral primary osteoarthritis, left hip: Secondary | ICD-10-CM | POA: Diagnosis not present

## 2021-04-18 DIAGNOSIS — S6412XD Injury of median nerve at wrist and hand level of left arm, subsequent encounter: Secondary | ICD-10-CM | POA: Diagnosis not present

## 2021-04-18 DIAGNOSIS — G5603 Carpal tunnel syndrome, bilateral upper limbs: Secondary | ICD-10-CM | POA: Diagnosis not present

## 2021-04-18 DIAGNOSIS — M4326 Fusion of spine, lumbar region: Secondary | ICD-10-CM | POA: Diagnosis not present

## 2021-04-18 DIAGNOSIS — Z6824 Body mass index (BMI) 24.0-24.9, adult: Secondary | ICD-10-CM | POA: Diagnosis not present

## 2021-04-18 DIAGNOSIS — M79643 Pain in unspecified hand: Secondary | ICD-10-CM | POA: Diagnosis not present

## 2021-04-18 DIAGNOSIS — M5416 Radiculopathy, lumbar region: Secondary | ICD-10-CM | POA: Diagnosis not present

## 2021-04-18 DIAGNOSIS — S6411XD Injury of median nerve at wrist and hand level of right arm, subsequent encounter: Secondary | ICD-10-CM | POA: Diagnosis not present

## 2021-04-24 DIAGNOSIS — R200 Anesthesia of skin: Secondary | ICD-10-CM | POA: Diagnosis not present

## 2021-04-24 DIAGNOSIS — R3129 Other microscopic hematuria: Secondary | ICD-10-CM | POA: Diagnosis not present

## 2021-04-24 DIAGNOSIS — N3000 Acute cystitis without hematuria: Secondary | ICD-10-CM | POA: Diagnosis not present

## 2021-04-24 DIAGNOSIS — N200 Calculus of kidney: Secondary | ICD-10-CM | POA: Diagnosis not present

## 2021-04-24 DIAGNOSIS — Z6825 Body mass index (BMI) 25.0-25.9, adult: Secondary | ICD-10-CM | POA: Diagnosis not present

## 2021-04-29 DIAGNOSIS — G5622 Lesion of ulnar nerve, left upper limb: Secondary | ICD-10-CM | POA: Diagnosis not present

## 2021-04-29 DIAGNOSIS — M7502 Adhesive capsulitis of left shoulder: Secondary | ICD-10-CM | POA: Diagnosis not present

## 2021-05-06 DIAGNOSIS — M5116 Intervertebral disc disorders with radiculopathy, lumbar region: Secondary | ICD-10-CM | POA: Diagnosis not present

## 2021-05-06 DIAGNOSIS — M5117 Intervertebral disc disorders with radiculopathy, lumbosacral region: Secondary | ICD-10-CM | POA: Diagnosis not present

## 2021-05-06 DIAGNOSIS — M4726 Other spondylosis with radiculopathy, lumbar region: Secondary | ICD-10-CM | POA: Diagnosis not present

## 2021-05-06 DIAGNOSIS — M4727 Other spondylosis with radiculopathy, lumbosacral region: Secondary | ICD-10-CM | POA: Diagnosis not present

## 2021-05-07 DIAGNOSIS — N139 Obstructive and reflux uropathy, unspecified: Secondary | ICD-10-CM | POA: Diagnosis not present

## 2021-05-07 DIAGNOSIS — R1084 Generalized abdominal pain: Secondary | ICD-10-CM | POA: Diagnosis not present

## 2021-05-08 DIAGNOSIS — N200 Calculus of kidney: Secondary | ICD-10-CM | POA: Diagnosis not present

## 2021-05-08 DIAGNOSIS — R1084 Generalized abdominal pain: Secondary | ICD-10-CM | POA: Diagnosis not present

## 2021-05-08 DIAGNOSIS — N281 Cyst of kidney, acquired: Secondary | ICD-10-CM | POA: Diagnosis not present

## 2021-05-08 DIAGNOSIS — I700 Atherosclerosis of aorta: Secondary | ICD-10-CM | POA: Diagnosis not present

## 2021-05-08 DIAGNOSIS — R310 Gross hematuria: Secondary | ICD-10-CM | POA: Diagnosis not present

## 2021-05-09 DIAGNOSIS — S6412XD Injury of median nerve at wrist and hand level of left arm, subsequent encounter: Secondary | ICD-10-CM | POA: Diagnosis not present

## 2021-05-09 DIAGNOSIS — M79643 Pain in unspecified hand: Secondary | ICD-10-CM | POA: Diagnosis not present

## 2021-05-09 DIAGNOSIS — G5603 Carpal tunnel syndrome, bilateral upper limbs: Secondary | ICD-10-CM | POA: Diagnosis not present

## 2021-05-09 DIAGNOSIS — S6411XD Injury of median nerve at wrist and hand level of right arm, subsequent encounter: Secondary | ICD-10-CM | POA: Diagnosis not present

## 2021-05-10 DIAGNOSIS — M79643 Pain in unspecified hand: Secondary | ICD-10-CM | POA: Diagnosis not present

## 2021-05-10 DIAGNOSIS — G5603 Carpal tunnel syndrome, bilateral upper limbs: Secondary | ICD-10-CM | POA: Diagnosis not present

## 2021-05-10 DIAGNOSIS — S6411XD Injury of median nerve at wrist and hand level of right arm, subsequent encounter: Secondary | ICD-10-CM | POA: Diagnosis not present

## 2021-05-10 DIAGNOSIS — S6412XD Injury of median nerve at wrist and hand level of left arm, subsequent encounter: Secondary | ICD-10-CM | POA: Diagnosis not present

## 2021-05-14 ENCOUNTER — Ambulatory Visit (INDEPENDENT_AMBULATORY_CARE_PROVIDER_SITE_OTHER): Payer: BC Managed Care – PPO | Admitting: Neurology

## 2021-05-14 ENCOUNTER — Encounter: Payer: Self-pay | Admitting: Neurology

## 2021-05-14 VITALS — BP 115/83 | HR 72 | Ht 64.0 in | Wt 151.5 lb

## 2021-05-14 DIAGNOSIS — Z79891 Long term (current) use of opiate analgesic: Secondary | ICD-10-CM | POA: Insufficient documentation

## 2021-05-14 DIAGNOSIS — S6411XD Injury of median nerve at wrist and hand level of right arm, subsequent encounter: Secondary | ICD-10-CM | POA: Diagnosis not present

## 2021-05-14 DIAGNOSIS — G4730 Sleep apnea, unspecified: Secondary | ICD-10-CM

## 2021-05-14 DIAGNOSIS — R51 Headache with orthostatic component, not elsewhere classified: Secondary | ICD-10-CM

## 2021-05-14 DIAGNOSIS — G5603 Carpal tunnel syndrome, bilateral upper limbs: Secondary | ICD-10-CM | POA: Diagnosis not present

## 2021-05-14 DIAGNOSIS — S6412XD Injury of median nerve at wrist and hand level of left arm, subsequent encounter: Secondary | ICD-10-CM | POA: Diagnosis not present

## 2021-05-14 DIAGNOSIS — G4710 Hypersomnia, unspecified: Secondary | ICD-10-CM | POA: Diagnosis not present

## 2021-05-14 DIAGNOSIS — R519 Headache, unspecified: Secondary | ICD-10-CM | POA: Insufficient documentation

## 2021-05-14 DIAGNOSIS — R0683 Snoring: Secondary | ICD-10-CM | POA: Diagnosis not present

## 2021-05-14 DIAGNOSIS — M79643 Pain in unspecified hand: Secondary | ICD-10-CM | POA: Diagnosis not present

## 2021-05-14 NOTE — Progress Notes (Signed)
SLEEP MEDICINE CLINIC    Provider:  Larey Seat, MD  Primary Care Physician:  Elenore Paddy, NP [place] [address]     Referring Provider: Melvenia Beam, [place] [place],  [place] [address]          Chief Complaint according to patient   Patient presents with:     New Patient (Initial Visit)           HISTORY OF PRESENT ILLNESS:  Anna Wilson is a 50 y.o. year old  female patient who is seen upon referral on 05/14/2021 . Chief concern according to patient :  here with husband for evaluation of snoring, fatigue and morning headaches. Migraines will wake her from sleep , cluster, top of the head, sharp, nauseating,  and she has woken up having headaches, dull, and sinus pressure related. She has mild nausea in AM too.    I have the pleasure of seeing Anna Wilson today, a right-handed White or Caucasian female with a possible sleep disorder. She  has a past medical history of Ankylosing spondylitis ([place]), Anxiety, Carpal tunnel syndrome, bilateral, DDD (degenerative disc disease), lumbosacral, Frequency of urination, History of 2019 novel coronavirus disease (COVID-19) (03/24/2020), History of chest pain, History of esophageal spasm, History of kidney stones, History of peptic ulcer (2016), History of recurrent UTIs, Hyperlipidemia, Hypertension, Migraines, OA (osteoarthritis), Renal calculus, left, and Wears glasses.       Sleep relevant medical history: Nocturia once a night, Sleep talking, punching in her sleep,    Family medical /sleep history: No other family member on CPAP with OSA, insomnia, sleep walkers.    Social history:  Patient is disabled, worked as a Probation officer  and lives in a household with spouse, youngest daughter- out of 3 kids, 2 dogs.  The patient  's daughter is doing most of the shopping and household.  Tobacco use quit 1995.  ETOH use : less than 3 glasses a month  Caffeine intake in form of Coffee( 1 cup in AM )  Soda( when eating out ) no Regular exercise in form of PT..         Sleep habits are as follows: The patient's dinner time is 6.30 PM. The patient goes to bed at 9-10 PM and continues to sleep for intervals of 3-4 hours, wakes for occasional bathroom break. She is in pain.  Bedroom, quiet , cool and dark.  The preferred sleep position is laterally, with the support of 1-2 pillows. Dreams are reportedly frequent/vivid.    11.30 AM is the usual rise time. The patient wakes up ( woken by dog, 6.30-7 AM )  She reports not feeling refreshed or restored in AM, with symptoms such as dry mouth, morning headaches, and residual fatigue. Naps are taken frequently, lasting from 4-5  hours and are less refreshing than nocturnal sleep.    Review of Systems: Out of a complete 14 system review, the patient complains of only the following symptoms, and all other reviewed systems are negative.:  Fatigued all the time, never rested, never energized. , sleepiness , Cluster headaches, snoring,pain, no sleep routine.  Depression ?  Sleeping for 12-16 hours a day - HYPERSOMNIA with few routines.    How likely are you to doze in the following situations: 0 = not likely, 1 = slight chance, 2 = moderate chance, 3 = high chance   Sitting and Reading? Watching Television? Sitting inactive in  a public place (theater or meeting)? As a passenger in a car for an hour without a break? Lying down in the afternoon when circumstances permit? Sitting and talking to someone? Sitting quietly after lunch without alcohol? In a car, while stopped for a few minutes in traffic?   Total = 13/ 24 points   FSS endorsed at 45/ 63 points.   Social History   Socioeconomic History   Marital status: Married    Spouse name: Anna Wilson   Number of children: 3   Years of education: Not on file   Highest education level: Associate degree: occupational, Hotel manager, or vocational program  Occupational History   Not on file  Tobacco Use    Smoking status: Former    Years: 6.00    Types: Cigarettes    Quit date: 06/07/1994    Years since quitting: 26.9   Smokeless tobacco: Never  Vaping Use   Vaping Use: Never used  Substance and Sexual Activity   Alcohol use: Yes    Comment: occasional   Drug use: No   Sexual activity: Not on file  Other Topics Concern   Not on file  Social History Narrative   Lives with husband and 1 child   Right handed   Caffeine: 1 cup of coffee a day   Social Determinants of Radio broadcast assistant Strain: Not on file  Food Insecurity: Not on file  Transportation Needs: Not on file  Physical Activity: Not on file  Stress: Not on file  Social Connections: Not on file    Family History  Problem Relation Age of Onset   Hypertension Mother    Hypertension Father    Hypothyroidism Brother    Rheum arthritis Daughter    Asthma Daughter    Hypothyroidism Brother    Asthma Son     Past Medical History:  Diagnosis Date   Ankylosing spondylitis ([place])    rhemotology--- dr Amil Amen,  treated with humira injection   Anxiety    Carpal tunnel syndrome, bilateral    DDD (degenerative disc disease), lumbosacral    Frequency of urination    History of 2019 novel coronavirus disease (COVID-19) 03/24/2020   per pt had mild symptoms that resolved in 10 days and lingering cough resolved few weeks later   History of chest pain    in epic was referred to cardiology, dr Johnsie Cancel (note 01-28-2017) by pt's pcp;  previous nuclear stress test negative for ischemia ef 69% (results in care everywhere 12-22-2016) and normal echo results 08-13-2014 in care everywhere;   pt had cardiac cath @[facility]  03-17-2017 (in epic) no angiographic evidence of CAD and LVEF 65%, non-cardiac chest pain   History of esophageal spasm    per pt hx chest pain from to no be non-cardiac but esophageal spasm and takes protonix which pt stated no spasm's taking protonix   History of kidney stones    History of peptic ulcer 2016    History of recurrent UTIs    Hyperlipidemia    Hypertension    followed by pcp   Migraines    followed by pcp   OA (osteoarthritis)    Renal calculus, left    Wears glasses     Past Surgical History:  Procedure Laterality Date   ANTERIOR CRUCIATE LIGAMENT (ACL) REVISION Left 03-09-2018  @[facility]    arthroscopy and quadriceps tendon graft   capel tunnel Bilateral 2022   wrist   CHOLECYSTECTOMY OPEN  0737   AND UMBILICIAL HERNIA REPAIR AND  ABDOMINOPLASTY   CYSTOSCOPY W/ URETERAL STENT PLACEMENT Left 05/31/2020   Procedure: CYSTOSCOPY WITH RETROGRADE PYELOGRAM/URETERAL STENT PLACEMENT;  Surgeon: Janith Lima, MD;  Location: [facility];  Service: Urology;  Laterality: Left;   CYSTOSCOPY/RETROGRADE/URETEROSCOPY/STONE EXTRACTION WITH BASKET Right 02/25/2020   Procedure: CYSTOSCOPY RETROGRADE/ URETEROSCOPY/STONE EXTRACTION WITH BASKET/URETERAL STENT PLACEMENT ;  Surgeon: Cleon Gustin, MD;  Location: [facility];  Service: Urology;  Laterality: Right;   CYSTOSCOPY/RETROGRADE/URETEROSCOPY/STONE EXTRACTION WITH BASKET  2012   CYSTOSCOPY/RETROGRADE/URETEROSCOPY/STONE EXTRACTION WITH BASKET Left 06/11/2020   Procedure: CYSTOSCOPY/RETROGRADE/URETEROSCOPY/ LASER LITHOTRIPSY/ STONE EXTRACTION WITH BASKET/ LEFT STENT EXCHANGE;  Surgeon: Janith Lima, MD;  Location: [facility];  Service: Urology;  Laterality: Left;  ONLY NEEDS 45 MIN   KNEE ARTHROSCOPY W/ ACL RECONSTRUCTION Left 2002   KNEE ARTHROSCOPY W/ SYNOVECTOMY Left 07-15-2018  @[facility]    lysis adhesions   KNEE SURGERY Left 2004 and 2017   revision scar tissue   LEFT HEART CATH AND CORONARY ANGIOGRAPHY N/A 03/17/2017   Procedure: LEFT HEART CATH AND CORONARY ANGIOGRAPHY;  Surgeon: Burnell Blanks, MD;  Location: [facility];  Service: Cardiovascular;  Laterality: N/A;   LUMBAR SPINE SURGERY  07-15-2019  @[facility]    L4--5 disectomy decompression;  L5--S1 laminectomy   PELVIC LAPAROSCOPY  1992   for endometriosis    POSTERIOR LUMBAR FUSION  12-20-2019  @[facility]    L4--5 revision laminectomy and fusion   SHOULDER ARTHROSCOPY Bilateral 2022   TOTAL LAPAROSCOPIC HYSTERECTOMY WITH BILATERAL SALPINGO OOPHORECTOMY  02/26/2016     Current Outpatient Medications on File Prior to Visit  Medication Sig Dispense Refill   amLODipine-valsartan (EXFORGE) 10-320 MG tablet Take 1 tablet by mouth daily.     Coenzyme Q10 (COQ-10) 100 MG capsule Take 100 mg by mouth daily.     docusate sodium (COLACE) 250 MG capsule Take 250 mg by mouth at bedtime.     DULoxetine (CYMBALTA) 60 MG capsule Take 60 mg by mouth daily.     escitalopram (LEXAPRO) 20 MG tablet Take 20 mg by mouth daily.     Fremanezumab-vfrm (AJOVY) 225 MG/1.5ML SOAJ Inject 225 mg into the skin every 30 (thirty) days. 1.5 mL 11   gabapentin (NEURONTIN) 300 MG capsule Take 300-600 mg by mouth 3 (three) times daily.     HUMIRA PEN 40 MG/0.4ML PNKT SMARTSIG:40 Milligram(s) SUB-Q Every 2 Weeks     Multiple Vitamin (MULTIVITAMIN ADULT PO) Take 1 tablet by mouth at bedtime.      Multiple Vitamin (MULTIVITAMIN) tablet Take 1 tablet by mouth daily.     nitrofurantoin, macrocrystal-monohydrate, (MACROBID) 100 MG capsule Take 100 mg by mouth at bedtime.     Omega-3 1000 MG CAPS Take 1,000 mg by mouth at bedtime.      ondansetron (ZOFRAN) 4 MG tablet Take 1 tablet (4 mg total) by mouth every 8 (eight) hours as needed for nausea or vomiting (nausea/vomiting). 20 tablet 0   oxyCODONE-acetaminophen (PERCOCET) 5-325 MG tablet Take 1 tablet by mouth every 4 (four) hours as needed for up to 6 doses for severe pain. 6 tablet 0   pantoprazole (PROTONIX) 40 MG tablet Take 40 mg by mouth 2 (two) times daily.     pravastatin (PRAVACHOL) 20 MG tablet Take 20 mg by mouth daily.     Probiotic Product (PROBIOTIC DAILY PO) Take 1 capsule by mouth daily. 62 billion     Red Yeast Rice Extract 600 MG CAPS Take 600 mg by mouth at bedtime.     tiZANidine (ZANAFLEX) 4  MG capsule Take 4 mg by mouth  3 (three) times daily as needed for muscle spasms.     topiramate (TOPAMAX) 50 MG tablet Take 50 mg by mouth 2 (two) times daily.     Turmeric (QC TUMERIC COMPLEX PO) Take 144 mg by mouth daily.     UBRELVY 50 MG TABS Take by mouth. 1 tablet as needed may repeat in 2 hours if needed for migraines     No current facility-administered medications on file prior to visit.    Allergies  Allergen Reactions   Nsaids Other (See Comments)    Ulcers   Sulfa Antibiotics Other (See Comments)    Pt states "I bleed from my body orficese when I take sulfa drugs"   Sulfasalazine Other (See Comments)    Pt states "I bleed from my body orficese when I take sulfa drugs"    Physical exam:  Today's Vitals   05/14/21 1001  BP: 115/83  Pulse: 72  Weight: 151 lb 8 oz (68.7 kg)  Height: 5' 4"  (1.626 m)   Body mass index is 26 kg/m.   Wt Readings from Last 3 Encounters:  05/14/21 151 lb 8 oz (68.7 kg)  02/18/21 143 lb (64.9 kg)  12/01/20 146 lb 9.7 oz (66.5 kg)     Ht Readings from Last 3 Encounters:  05/14/21 5' 4"  (1.626 m)  02/18/21 5' 3"  (1.6 m)  11/30/20 5' 4"  (1.626 m)      General: The patient is awake, alert and appears not in acute distress. The patient is well groomed. Head: Normocephalic, atraumatic. Neck is supple. Mallampati 2, peaked palate. TMJ click.,  neck circumference:14 inches . Nasal airflow  patent.  Retrognathia is not seen.  Dental status: bruxism marks.  Cardiovascular:  Regular rate and cardiac rhythm by pulse,  without distended neck veins. Respiratory: Lungs are clear to auscultation.  Skin:  Without evidence of ankle edema, or rash. Trunk: The patient's posture is erect.   Neurologic exam : The patient is awake and alert, oriented to place and time.   Memory subjective described as intact.  Attention span & concentration ability appears normal.  Speech is fluent,  without  dysarthria, dysphonia or aphasia.  Mood and affect are cooperative, she is talkative.  .   Cranial nerves: no loss of smell or taste reported  Pupils are equal and briskly reactive to light. Funduscopic exam deferred. .  Extraocular movements in vertical and horizontal planes were intact and without nystagmus.  No Diplopia. Visual fields by finger perimetry are intact. Hearing was intact to soft voice and finger rubbing.    Facial sensation intact to fine touch.  Facial motor strength is symmetric and tongue and uvula move midline.  Neck ROM : rotation, tilt and flexion extension were normal for age and shoulder shrug was symmetrical.   Vertigo in response to head rotation.   Motor exam:  Symmetric bulk, tone and ROM.   Normal tone without cog wheeling, reduced grip strength, right strength more evident than left.   Sensory:  Fine touch, pinprick and vibration were tested  and  normal.  Proprioception tested in the upper extremities was normal.   Coordination: Rapid alternating movements in the fingers/hands were of normal speed.  The Finger-to-nose maneuver was intact without evidence of ataxia, dysmetria or tremor.   Gait and station: Patient could rise unassisted from a seated position, walked without assistive device.  Stance is of normal width/ base and the patient turned with 3 steps.  Toe and heel walk were deferred.  Deep tendon reflexes: in the  upper and lower extremities are trace only, Babinski response was deferred .       After spending a total time of  45  minutes face to face and additional time for physical and neurologic examination, review of laboratory studies,  personal review of imaging studies, reports and results of other testing and review of referral information / records as far as provided in visit, I have established the following assessments:  1) cluster and morning headaches are described, both are related to sleep disorder. 2) multiple sites of pain, arthralgia, myalgia, and headaches. Vertigo. Falls.  3) HYPERSOMNIA, excessive daytime  sleepiness.  4) snoring - in all positions.    My Plan is to proceed with:  1) HST or PSG,  2) implementing sleep routines, set a wake up and rise time, restrict time in bed to 8 hours.  3) sleep hygiene.   I would like to thank Elenore Paddy, NP and Melvenia Beam, [address] [place],  [place] [address] for allowing me to meet with and to take care of this pleasant patient.   In short, Anna Wilson is presenting with sleep related headaches, EDS/ hypersomnia, a symptom that can be attributed to depression, OSA or medication .  I plan to follow up either personally or through our NP within 3-4 month.   CC: I will share my notes with Dr Jaynee Eagles .  Electronically signed by: Larey Seat, MD 05/14/2021 10:09 AM  [facility] Board certified by The [employer] of Sleep Medicine and Diplomate of the [employer] of Sleep Medicine. Board certified In Neurology through the [place], Fellow of the [employer] of Neurology. Medical Director of [facility].

## 2021-05-14 NOTE — Patient Instructions (Addendum)
Quality Sleep Information, Adult Quality sleep is important for your mental and physical health. It also improves your quality of life. Quality sleep means you: Are asleep for most of the time you are in bed. Fall asleep within 30 minutes. Wake up no more than once a night.  Are awake for no longer than 20 minutes if you do wake up during the night. Most adults need 7-8 hours of quality sleep each night. How can poor sleep affect me? If you do not get enough quality sleep, you may have: Mood swings. Daytime sleepiness. Confusion. Decreased reaction time. Sleep disorders, such as insomnia and sleep apnea. Difficulty with: Solving problems. Coping with stress. Paying attention. These issues may affect your performance and productivity at work, school, and at home. Lack of sleep may also put you at higher risk for accidents, suicide, and risky behaviors. If you do not get quality sleep you may also be at higher risk for several health problems, including: Infections. Type 2 diabetes. Heart disease. High blood pressure. Obesity. Worsening of long-term conditions, like arthritis, kidney disease, depression, Parkinson's disease, and epilepsy. What actions can I take to get more quality sleep?   Stick to a sleep schedule. Go to sleep and wake up at about the same time each day. Do not try to sleep less on weekdays and make up for lost sleep on weekends. This does not work. Try to get about 30 minutes of exercise on most days. Do not exercise 2-3 hours before going to bed. Limit naps during the day to 30 minutes or less. Do not use any products that contain nicotine or tobacco, such as cigarettes or e-cigarettes. If you need help quitting, ask your health care provider. Do not drink caffeinated beverages for at least 8 hours before going to bed. Coffee, tea, and some sodas contain caffeine. Do not drink alcohol close to bedtime. Do not eat large meals close to bedtime. Do not take naps in  the late afternoon. Try to get at least 30 minutes of sunlight every day. Morning sunlight is best. Make time to relax before bed. Reading, listening to music, or taking a hot bath promotes quality sleep. Make your bedroom a place that promotes quality sleep. Keep your bedroom dark, quiet, and at a comfortable room temperature. Make sure your bed is comfortable. Take out sleep distractions like TV, a computer, smartphone, and bright lights. If you are lying awake in bed for longer than 20 minutes, get up and do a relaxing activity until you feel sleepy. Work with your health care provider to treat medical conditions that may affect sleeping, such as: Nasal obstruction. Snoring. Sleep apnea and other sleep disorders. Talk to your health care provider if you think any of your prescription medicines may cause you to have difficulty falling or staying asleep. If you have sleep problems, talk with a sleep consultant. If you think you have a sleep disorder, talk with your health care provider about getting evaluated by a specialist. Where to find more information [place] website: https://sleepfoundation.org National Heart, Lung, and [place] ([place]): http://www.saunders.info/.pdf Centers for Disease Control and Prevention (CDC): LearningDermatology.pl Contact a health care provider if you: Have trouble getting to sleep or staying asleep. Often wake up very early in the morning and cannot get back to sleep. Have daytime sleepiness. Have daytime sleep attacks of suddenly falling asleep and sudden muscle weakness (narcolepsy). Have a tingling sensation in your legs with a strong urge to move your legs (restless  legs syndrome). Stop breathing briefly during sleep (sleep apnea). Think you have a sleep disorder or are taking a medicine that is affecting your quality of sleep. Summary Most adults need 7-8 hours of quality sleep each  night. Getting enough quality sleep is an important part of health and well-being. Make your bedroom a place that promotes quality sleep and avoid things that may cause you to have poor sleep, such as alcohol, caffeine, smoking, and large meals. Talk to your health care provider if you have trouble falling asleep or staying asleep. This information is not intended to replace advice given to you by your health care provider. Make sure you discuss any questions you have with your health care provider. Document Revised: 11/04/2017 Document Reviewed: 11/04/2017 Elsevier Patient Education  2022 [place]. Hypersomnia Hypersomnia is a condition in which a person feels very tired during the day even though he or she gets plenty of sleep at night. A person with this condition may take naps during the day and may find it very difficult to wake up from sleep. Hypersomnia may affect a person's ability to think, concentrate, drive, or remember things. What are the causes? The cause of this condition may not be known. Possible causes include: Certain medicines. Sleep disorders, such as narcolepsy and sleep apnea. Injury to the head, brain, or spinal cord. Drug or alcohol use. Gastroesophageal reflux disease (GERD). Tumors. Certain medical conditions, such as depression, diabetes, or an underactive thyroid gland (hypothyroidism). What are the signs or symptoms? The main symptoms of hypersomnia include: Feeling very tired throughout the day, regardless of how much sleep you got the night before. Having trouble waking up. Others may find it difficult to wake you up when you are sleeping. Sleeping for longer and longer periods at a time. Taking naps throughout the day. Other symptoms may include: Feeling restless, anxious, or annoyed. Lacking energy. Having trouble with: Remembering. Speaking. Thinking. Loss of appetite. Seeing, hearing, tasting, smelling, or feeling things that are not real  (hallucinations). How is this diagnosed? This condition may be diagnosed based on: Your symptoms and medical history. Your sleeping habits. Your health care provider may ask you to write down your sleeping habits in a daily sleep log, along with any symptoms you have. A series of tests that are done while you sleep (sleep study or polysomnogram). A test that measures how quickly you can fall asleep during the day (daytime nap study or multiple sleep latency test). How is this treated? Treatment can help you manage your condition. Treatment may include: Following a regular sleep routine. Lifestyle changes, such as changing your eating habits, getting regular exercise, and avoiding alcohol or caffeinated beverages. Taking medicines to make you more alert (stimulants) during the day. Treating any underlying medical causes of hypersomnia. Follow these instructions at home: Sleep routine  Schedule the same bedtime and wake-up time each day. Practice a relaxing bedtime routine. This may include reading, meditation, deep breathing, or taking a warm bath before going to sleep. Get regular exercise each day. Avoid strenuous exercise in the evening hours. Keep your sleep environment at a cooler temperature, darkened, and quiet. Sleep with pillows and a mattress that are comfortable and supportive. Schedule short 20-minute naps for when you feel sleepiest during the day. Talk with your employer or teachers about your hypersomnia. If possible, adjust your schedule so that: You have a regular daytime work schedule. You can take a scheduled nap during the day. You do not have to work or  be active at night. Do not eat a heavy meal for a few hours before bedtime. Eat your meals at about the same times every day. Avoid drinking alcohol or caffeinated beverages. Safety  Do not drive or use heavy machinery if you are sleepy. Ask your health care provider if it is safe for you to drive. Wear a life jacket  when swimming or spending time near water. General instructions Take supplements and over-the-counter and prescription medicines only as told by your health care provider. Keep a sleep log that will help your doctor manage your condition. This may include information about: What time you go to bed each night. How often you wake up at night. How many hours you sleep at night. How often and for how long you nap during the day. Any observations from others, such as leg movements during sleep, sleep walking, or snoring. Keep all follow-up visits as told by your health care provider. This is important. Contact a health care provider if: You have new symptoms. Your symptoms get worse. Get help right away if: You have serious thoughts about hurting yourself or someone else. If you ever feel like you may hurt yourself or others, or have thoughts about taking your own life, get help right away. You can go to your nearest emergency department or call: Your local emergency services (911 in the [place] A suicide crisis helpline, such as the [place] at (574) 490-1437. This is open 24 hours a day. Summary Hypersomnia refers to a condition in which you feel very tired during the day even though you get plenty of sleep at night. A person with this condition may take naps during the day and may find it very difficult to wake up from sleep. Hypersomnia may affect a person's ability to think, concentrate, drive, or remember things. Treatment, such as following a regular sleep routine and making some lifestyle changes, can help you manage your condition. This information is not intended to replace advice given to you by your health care provider. Make sure you discuss any questions you have with your health care provider. Document Revised: 06/07/2020 Document Reviewed: 06/07/2020 Elsevier Patient Education  2022 [employer].

## 2021-05-16 DIAGNOSIS — M4326 Fusion of spine, lumbar region: Secondary | ICD-10-CM | POA: Diagnosis not present

## 2021-05-16 DIAGNOSIS — G5603 Carpal tunnel syndrome, bilateral upper limbs: Secondary | ICD-10-CM | POA: Diagnosis not present

## 2021-05-16 DIAGNOSIS — S6412XD Injury of median nerve at wrist and hand level of left arm, subsequent encounter: Secondary | ICD-10-CM | POA: Diagnosis not present

## 2021-05-16 DIAGNOSIS — M5416 Radiculopathy, lumbar region: Secondary | ICD-10-CM | POA: Diagnosis not present

## 2021-05-16 DIAGNOSIS — S6411XD Injury of median nerve at wrist and hand level of right arm, subsequent encounter: Secondary | ICD-10-CM | POA: Diagnosis not present

## 2021-05-16 DIAGNOSIS — M79643 Pain in unspecified hand: Secondary | ICD-10-CM | POA: Diagnosis not present

## 2021-05-20 DIAGNOSIS — S6412XD Injury of median nerve at wrist and hand level of left arm, subsequent encounter: Secondary | ICD-10-CM | POA: Diagnosis not present

## 2021-05-20 DIAGNOSIS — G5603 Carpal tunnel syndrome, bilateral upper limbs: Secondary | ICD-10-CM | POA: Diagnosis not present

## 2021-05-20 DIAGNOSIS — S6411XD Injury of median nerve at wrist and hand level of right arm, subsequent encounter: Secondary | ICD-10-CM | POA: Diagnosis not present

## 2021-05-20 DIAGNOSIS — M79643 Pain in unspecified hand: Secondary | ICD-10-CM | POA: Diagnosis not present

## 2021-05-22 DIAGNOSIS — S6411XD Injury of median nerve at wrist and hand level of right arm, subsequent encounter: Secondary | ICD-10-CM | POA: Diagnosis not present

## 2021-05-22 DIAGNOSIS — G5603 Carpal tunnel syndrome, bilateral upper limbs: Secondary | ICD-10-CM | POA: Diagnosis not present

## 2021-05-22 DIAGNOSIS — S6412XD Injury of median nerve at wrist and hand level of left arm, subsequent encounter: Secondary | ICD-10-CM | POA: Diagnosis not present

## 2021-05-22 DIAGNOSIS — M79643 Pain in unspecified hand: Secondary | ICD-10-CM | POA: Diagnosis not present

## 2021-05-24 DIAGNOSIS — N200 Calculus of kidney: Secondary | ICD-10-CM | POA: Diagnosis not present

## 2021-05-24 DIAGNOSIS — N390 Urinary tract infection, site not specified: Secondary | ICD-10-CM | POA: Diagnosis not present

## 2021-05-24 DIAGNOSIS — Z6825 Body mass index (BMI) 25.0-25.9, adult: Secondary | ICD-10-CM | POA: Diagnosis not present

## 2021-05-27 DIAGNOSIS — F320 Major depressive disorder, single episode, mild: Secondary | ICD-10-CM | POA: Diagnosis not present

## 2021-05-27 DIAGNOSIS — S6411XD Injury of median nerve at wrist and hand level of right arm, subsequent encounter: Secondary | ICD-10-CM | POA: Diagnosis not present

## 2021-05-27 DIAGNOSIS — M79643 Pain in unspecified hand: Secondary | ICD-10-CM | POA: Diagnosis not present

## 2021-05-27 DIAGNOSIS — G5603 Carpal tunnel syndrome, bilateral upper limbs: Secondary | ICD-10-CM | POA: Diagnosis not present

## 2021-05-27 DIAGNOSIS — S6412XD Injury of median nerve at wrist and hand level of left arm, subsequent encounter: Secondary | ICD-10-CM | POA: Diagnosis not present

## 2021-05-30 DIAGNOSIS — M79643 Pain in unspecified hand: Secondary | ICD-10-CM | POA: Diagnosis not present

## 2021-05-30 DIAGNOSIS — S6411XD Injury of median nerve at wrist and hand level of right arm, subsequent encounter: Secondary | ICD-10-CM | POA: Diagnosis not present

## 2021-05-30 DIAGNOSIS — G5603 Carpal tunnel syndrome, bilateral upper limbs: Secondary | ICD-10-CM | POA: Diagnosis not present

## 2021-05-30 DIAGNOSIS — S6412XD Injury of median nerve at wrist and hand level of left arm, subsequent encounter: Secondary | ICD-10-CM | POA: Diagnosis not present

## 2021-06-03 DIAGNOSIS — S6412XD Injury of median nerve at wrist and hand level of left arm, subsequent encounter: Secondary | ICD-10-CM | POA: Diagnosis not present

## 2021-06-03 DIAGNOSIS — G5603 Carpal tunnel syndrome, bilateral upper limbs: Secondary | ICD-10-CM | POA: Diagnosis not present

## 2021-06-03 DIAGNOSIS — S6411XD Injury of median nerve at wrist and hand level of right arm, subsequent encounter: Secondary | ICD-10-CM | POA: Diagnosis not present

## 2021-06-03 DIAGNOSIS — M79643 Pain in unspecified hand: Secondary | ICD-10-CM | POA: Diagnosis not present

## 2021-06-04 ENCOUNTER — Telehealth: Payer: Self-pay

## 2021-06-04 DIAGNOSIS — N3020 Other chronic cystitis without hematuria: Secondary | ICD-10-CM | POA: Diagnosis not present

## 2021-06-04 DIAGNOSIS — N200 Calculus of kidney: Secondary | ICD-10-CM | POA: Diagnosis not present

## 2021-06-04 DIAGNOSIS — N281 Cyst of kidney, acquired: Secondary | ICD-10-CM | POA: Diagnosis not present

## 2021-06-04 NOTE — Telephone Encounter (Signed)
LVM for pt to call me back to schedule sleep study

## 2021-06-05 DIAGNOSIS — M5416 Radiculopathy, lumbar region: Secondary | ICD-10-CM | POA: Diagnosis not present

## 2021-06-07 DIAGNOSIS — S6411XD Injury of median nerve at wrist and hand level of right arm, subsequent encounter: Secondary | ICD-10-CM | POA: Diagnosis not present

## 2021-06-07 DIAGNOSIS — G5603 Carpal tunnel syndrome, bilateral upper limbs: Secondary | ICD-10-CM | POA: Diagnosis not present

## 2021-06-07 DIAGNOSIS — S6412XD Injury of median nerve at wrist and hand level of left arm, subsequent encounter: Secondary | ICD-10-CM | POA: Diagnosis not present

## 2021-06-07 DIAGNOSIS — M79643 Pain in unspecified hand: Secondary | ICD-10-CM | POA: Diagnosis not present

## 2021-06-10 DIAGNOSIS — S6411XD Injury of median nerve at wrist and hand level of right arm, subsequent encounter: Secondary | ICD-10-CM | POA: Diagnosis not present

## 2021-06-10 DIAGNOSIS — G5603 Carpal tunnel syndrome, bilateral upper limbs: Secondary | ICD-10-CM | POA: Diagnosis not present

## 2021-06-10 DIAGNOSIS — S6412XD Injury of median nerve at wrist and hand level of left arm, subsequent encounter: Secondary | ICD-10-CM | POA: Diagnosis not present

## 2021-06-10 DIAGNOSIS — M79643 Pain in unspecified hand: Secondary | ICD-10-CM | POA: Diagnosis not present

## 2021-06-10 DIAGNOSIS — M79642 Pain in left hand: Secondary | ICD-10-CM | POA: Diagnosis not present

## 2021-06-12 DIAGNOSIS — M79643 Pain in unspecified hand: Secondary | ICD-10-CM | POA: Diagnosis not present

## 2021-06-12 DIAGNOSIS — S6411XD Injury of median nerve at wrist and hand level of right arm, subsequent encounter: Secondary | ICD-10-CM | POA: Diagnosis not present

## 2021-06-12 DIAGNOSIS — G5603 Carpal tunnel syndrome, bilateral upper limbs: Secondary | ICD-10-CM | POA: Diagnosis not present

## 2021-06-12 DIAGNOSIS — S6412XD Injury of median nerve at wrist and hand level of left arm, subsequent encounter: Secondary | ICD-10-CM | POA: Diagnosis not present

## 2021-06-13 DIAGNOSIS — F320 Major depressive disorder, single episode, mild: Secondary | ICD-10-CM | POA: Diagnosis not present

## 2021-06-14 DIAGNOSIS — S6411XD Injury of median nerve at wrist and hand level of right arm, subsequent encounter: Secondary | ICD-10-CM | POA: Diagnosis not present

## 2021-06-14 DIAGNOSIS — G5603 Carpal tunnel syndrome, bilateral upper limbs: Secondary | ICD-10-CM | POA: Diagnosis not present

## 2021-06-14 DIAGNOSIS — M79643 Pain in unspecified hand: Secondary | ICD-10-CM | POA: Diagnosis not present

## 2021-06-14 DIAGNOSIS — S6412XD Injury of median nerve at wrist and hand level of left arm, subsequent encounter: Secondary | ICD-10-CM | POA: Diagnosis not present

## 2021-06-17 ENCOUNTER — Telehealth: Payer: Self-pay | Admitting: Adult Health

## 2021-06-17 DIAGNOSIS — M79643 Pain in unspecified hand: Secondary | ICD-10-CM | POA: Diagnosis not present

## 2021-06-17 DIAGNOSIS — G5603 Carpal tunnel syndrome, bilateral upper limbs: Secondary | ICD-10-CM | POA: Diagnosis not present

## 2021-06-17 DIAGNOSIS — S6412XD Injury of median nerve at wrist and hand level of left arm, subsequent encounter: Secondary | ICD-10-CM | POA: Diagnosis not present

## 2021-06-17 DIAGNOSIS — S6411XD Injury of median nerve at wrist and hand level of right arm, subsequent encounter: Secondary | ICD-10-CM | POA: Diagnosis not present

## 2021-06-17 NOTE — Telephone Encounter (Signed)
LVM & sent mychart message advising pt of appointment change. Asked pt to call us back if her new appointment time did not work for her.

## 2021-06-18 ENCOUNTER — Other Ambulatory Visit: Payer: Self-pay | Admitting: Urology

## 2021-06-19 NOTE — Progress Notes (Addendum)
COVID swab appointment: N/A  COVID Vaccine Completed:  No Date COVID Vaccine completed: Has received booster: COVID vaccine manufacturer: [employer]   Date of COVID positive in last 33 days:No  PCP - Carlyle Lipa, NP Cardiologist - Jenkins Rouge, MD (last OV 2018)  Chest x-ray - 11-29-20 Epic EKG - 11-30-20 Epic Stress Test - greater than 2 years [facility] ECHO - greater than 2 years [facility] Cardiac Cath - 2018 Epic Pacemaker/ICD device last checked: Spinal Cord Stimulator:  Sleep Study - Scheduled for 07/15/21 CPAP -   Fasting Blood Sugar - N/A Checks Blood Sugar _____ times a day  Blood Thinner Instructions:N/A Aspirin Instructions: Last Dose:  Activity level:  Can go up a flight of stairs and perform activities of daily living without stopping and without symptoms of chest pain or shortness of breath.    Anesthesia review:  Chest pain evaluated by cardiology, HTN  Patient states that she continues to have periodic chest pain and they have been diagnosed as esophageal spams  Patient denies shortness of breath, fever, cough and chest pain at PAT appointment   Patient verbalized understanding of instructions that were given to them at the PAT appointment. Patient was also instructed that they will need to review over the PAT instructions again at home before surgery.

## 2021-06-19 NOTE — Patient Instructions (Addendum)
DUE TO COVID-19 ONLY ONE VISITOR IS ALLOWED TO COME WITH YOU AND STAY IN THE WAITING ROOM ONLY DURING PRE OP AND PROCEDURE.   **NO VISITORS ARE ALLOWED IN THE SHORT STAY AREA OR RECOVERY ROOM!!**        Your procedure is scheduled on:  Friday, 06-21-21   Report to [facility] Main  Entrance     Report to admitting at 10:45 AM   Call this number if you have problems the morning of surgery [phone]   Do not eat food :After Midnight.   May have liquids until 10:00 AM day of surgery  CLEAR LIQUID DIET  Foods Allowed                                                                     Foods Excluded  Water, Black Coffee (no milk/no creamer) and tea, regular and decaf                              liquids that you cannot  Plain Jell-O in any flavor  (No red)                         see through such as: Fruit ices (not with fruit pulp)                                 milk, soups, orange juice  Iced Popsicles (No red)                                    All solid food                             Apple juices Sports drinks like Gatorade (No red) Lightly seasoned clear broth or consume(fat free) Sugar     Oral Hygiene is also important to reduce your risk of infection.                                    Remember - BRUSH YOUR TEETH THE MORNING OF SURGERY WITH YOUR REGULAR TOOTHPASTE   Do NOT smoke after Midnight  Take these medicines the morning of surgery with A SIP OF WATER:  Duloxetine, Lexapro, Gabapentin, Pantoprazole, Pravastatin, Topiramate.  Oxycodone and Zofran if needed   Stop all vitamins and herbal supplements a week before surgery             You may not have any metal on your body including hair pins, jewelry, and body piercing             Do not wear make-up, lotions, powders, perfumes or deodorant  Do not wear nail polish including gel and S&S, artificial/acrylic nails, or any other type of covering on natural nails including finger and toenails. If you  have artificial nails, gel coating, etc. that needs to be removed by a nail salon please have this removed prior to  surgery or surgery may need to be canceled/ delayed if the surgeon/ anesthesia feels like they are unable to be safely monitored.   Do not shave  48 hours prior to surgery.      Do not bring valuables to the hospital. [place].   Contacts, dentures or bridgework may not be worn into surgery.    Patients discharged the day of surgery will not be allowed to drive home.  Special Instructions: Bring a copy of your healthcare power of attorney and living will documents the day of surgery if you haven't scanned them in before.  Please read over the following fact sheets you were given: IF YOU HAVE QUESTIONS ABOUT YOUR PRE OP INSTRUCTIONS PLEASE CALL [facility] - Preparing for Surgery Before surgery, you can play an important role.  Because skin is not sterile, your skin needs to be as free of germs as possible.  You can reduce the number of germs on your skin by washing with CHG (chlorahexidine gluconate) soap before surgery.  CHG is an antiseptic cleaner which kills germs and bonds with the skin to continue killing germs even after washing. Please DO NOT use if you have an allergy to CHG or antibacterial soaps.  If your skin becomes reddened/irritated stop using the CHG and inform your nurse when you arrive at Short Stay. Do not shave (including legs and underarms) for at least 48 hours prior to the first CHG shower.  You may shave your face/neck.  Please follow these instructions carefully:  1.  Shower with CHG Soap the night before surgery and the  morning of surgery.  2.  If you choose to wash your hair, wash your hair first as usual with your normal  shampoo.  3.  After you shampoo, rinse your hair and body thoroughly to remove the shampoo.                             4.  Use CHG as you would any other liquid soap.  You can  apply chg directly to the skin and wash.  Gently with a scrungie or clean washcloth.  5.  Apply the CHG Soap to your body ONLY FROM THE NECK DOWN.   Do   not use on face/ open                           Wound or open sores. Avoid contact with eyes, ears mouth and   genitals (private parts).                       Wash face,  Genitals (private parts) with your normal soap.             6.  Wash thoroughly, paying special attention to the area where your    surgery  will be performed.  7.  Thoroughly rinse your body with warm water from the neck down.  8.  DO NOT shower/wash with your normal soap after using and rinsing off the CHG Soap.                9.  Pat yourself dry with a clean towel.            10.  Wear clean pajamas.            11.  Place  clean sheets on your bed the night of your first shower and do not  sleep with pets. Day of Surgery : Do not apply any lotions/deodorants the morning of surgery.  Please wear clean clothes to the hospital/surgery center.  FAILURE TO FOLLOW THESE INSTRUCTIONS MAY RESULT IN THE CANCELLATION OF YOUR SURGERY  PATIENT SIGNATURE_________________________________  NURSE SIGNATURE__________________________________  ________________________________________________________________________

## 2021-06-20 ENCOUNTER — Other Ambulatory Visit: Payer: Self-pay

## 2021-06-20 ENCOUNTER — Encounter (HOSPITAL_COMMUNITY): Payer: Self-pay

## 2021-06-20 ENCOUNTER — Encounter (HOSPITAL_COMMUNITY)
Admission: RE | Admit: 2021-06-20 | Discharge: 2021-06-20 | Disposition: A | Discharge status: Home / Self Care | Payer: BC Managed Care – PPO | Source: Ambulatory Visit | Attending: Urology | Admitting: Urology

## 2021-06-20 VITALS — BP 125/85 | HR 72 | Temp 98.6°F | Resp 16 | Ht 63.75 in | Wt 150.8 lb

## 2021-06-20 DIAGNOSIS — Z79899 Other long term (current) drug therapy: Secondary | ICD-10-CM | POA: Diagnosis not present

## 2021-06-20 DIAGNOSIS — N281 Cyst of kidney, acquired: Secondary | ICD-10-CM | POA: Diagnosis not present

## 2021-06-20 DIAGNOSIS — S6412XD Injury of median nerve at wrist and hand level of left arm, subsequent encounter: Secondary | ICD-10-CM | POA: Diagnosis not present

## 2021-06-20 DIAGNOSIS — S6411XD Injury of median nerve at wrist and hand level of right arm, subsequent encounter: Secondary | ICD-10-CM | POA: Diagnosis not present

## 2021-06-20 DIAGNOSIS — I10 Essential (primary) hypertension: Secondary | ICD-10-CM | POA: Insufficient documentation

## 2021-06-20 DIAGNOSIS — G5603 Carpal tunnel syndrome, bilateral upper limbs: Secondary | ICD-10-CM | POA: Diagnosis not present

## 2021-06-20 DIAGNOSIS — M459 Ankylosing spondylitis of unspecified sites in spine: Secondary | ICD-10-CM | POA: Insufficient documentation

## 2021-06-20 DIAGNOSIS — M79643 Pain in unspecified hand: Secondary | ICD-10-CM | POA: Diagnosis not present

## 2021-06-20 DIAGNOSIS — Z8616 Personal history of COVID-19: Secondary | ICD-10-CM | POA: Insufficient documentation

## 2021-06-20 DIAGNOSIS — Z87891 Personal history of nicotine dependence: Secondary | ICD-10-CM | POA: Insufficient documentation

## 2021-06-20 DIAGNOSIS — Z87442 Personal history of urinary calculi: Secondary | ICD-10-CM | POA: Diagnosis not present

## 2021-06-20 DIAGNOSIS — F320 Major depressive disorder, single episode, mild: Secondary | ICD-10-CM | POA: Diagnosis not present

## 2021-06-20 DIAGNOSIS — Z01818 Encounter for other preprocedural examination: Secondary | ICD-10-CM

## 2021-06-20 DIAGNOSIS — Z01812 Encounter for preprocedural laboratory examination: Secondary | ICD-10-CM | POA: Insufficient documentation

## 2021-06-20 DIAGNOSIS — Z87440 Personal history of urinary (tract) infections: Secondary | ICD-10-CM | POA: Diagnosis not present

## 2021-06-20 DIAGNOSIS — N200 Calculus of kidney: Secondary | ICD-10-CM | POA: Insufficient documentation

## 2021-06-20 DIAGNOSIS — N132 Hydronephrosis with renal and ureteral calculous obstruction: Secondary | ICD-10-CM | POA: Diagnosis not present

## 2021-06-20 HISTORY — DX: Anemia, unspecified: D64.9

## 2021-06-20 HISTORY — DX: Gastro-esophageal reflux disease without esophagitis: K21.9

## 2021-06-20 HISTORY — DX: Pneumonia, unspecified organism: J18.9

## 2021-06-20 LAB — BASIC METABOLIC PANEL
Anion gap: 7 (ref 5–15)
BUN: 16 mg/dL (ref 6–20)
CO2: 25 mmol/L (ref 22–32)
Calcium: 9.7 mg/dL (ref 8.9–10.3)
Chloride: 107 mmol/L (ref 98–111)
Creatinine, Ser: 0.98 mg/dL (ref 0.44–1.00)
GFR, Estimated: >60 mL/min (ref >60)
Glucose, Bld: 105 mg/dL — ABNORMAL HIGH (ref 70–99)
Potassium: 4.7 mmol/L (ref 3.5–5.1)
Sodium: 139 mmol/L (ref 135–145)

## 2021-06-20 LAB — CBC
HCT: 41.2 % (ref 36.0–46.0)
Hemoglobin: 13.2 g/dL (ref 12.0–15.0)
MCH: 28.5 pg (ref 26.0–34.0)
MCHC: 32 g/dL (ref 30.0–36.0)
MCV: 89 fL (ref 80.0–100.0)
Platelets: 314 10*3/uL (ref 150–400)
RBC: 4.63 MIL/uL (ref 3.87–5.11)
RDW: 12.3 % (ref 11.5–15.5)
WBC: 6.6 10*3/uL (ref 4.0–10.5)
nRBC: 0 % (ref 0.0–0.2)

## 2021-06-20 NOTE — Progress Notes (Signed)
Anesthesia Chart Review   Case: [id number] Date/Time: 06/21/21 1245   Procedure: CYSTOSCOPY/RETROGRADE/URETEROSCOPY/HOLMIUM LASER/STENT PLACEMENT (Bilateral) - ONLY NEEDS 60 MIN   Anesthesia type: General   Pre-op diagnosis: BILATERAL RENAL STONES   Location: [facility] / [facility]   Surgeons: Janith Lima, MD       DISCUSSION:50 y.o. former smoker with h/o HTN, ankylosing spondylitis, GERD, esophageal spasm, bilateral renal stones scheduled for above procedure 06/21/21 with Dr. Rexene Alberts.   Chest pain evaluated in 2018 with negative stress, cath, normal Echo.  Attributed to esophageal spasm.    Anticipate pt can proceed with planned procedure barring acute status change.   VS: BP 125/85   Pulse 72   Temp 37 C (Oral)   Resp 16   Ht 5' 3.75" (1.619 m)   Wt 68.4 kg   LMP  (LMP Unknown)   SpO2 100%   BMI 26.09 kg/m   PROVIDERS: Elenore Paddy, NP is PCP    LABS: Labs reviewed: Acceptable for surgery. (all labs ordered are listed, but only abnormal results are displayed)  Labs Reviewed  BASIC METABOLIC PANEL - Abnormal; Notable for the following components:      Result Value   Glucose, Bld 105 (*)    All other components within normal limits  CBC     IMAGES:   EKG: 11/30/20 Rate 65 bpm  Sinus rhythm   CV: Cardiac Cath 03/17/2017   The left ventricular systolic function is normal. LV end diastolic pressure is normal. The left ventricular ejection fraction is greater than 65% by visual estimate. There is no mitral valve regurgitation.   1. No angiographic evidence of CAD 2. Normal LV systolic function 3. Normal LV pressures 4. The aortic root/ascending aorta appears to be normal in size.    Recommendations: No further ischemic workup. Her chest pain is felt to be non-cardiac.  Past Medical History:  Diagnosis Date   Anemia    Ankylosing spondylitis ([place])    rhemotology--- dr Amil Amen,  treated with humira injection   Anxiety    Carpal tunnel  syndrome, bilateral    DDD (degenerative disc disease), lumbosacral    Frequency of urination    GERD (gastroesophageal reflux disease)    History of 2019 novel coronavirus disease (COVID-19) 03/24/2020   per pt had mild symptoms that resolved in 10 days and lingering cough resolved few weeks later   History of chest pain    in epic was referred to cardiology, dr Johnsie Cancel (note 01-28-2017) by pt's pcp;  previous nuclear stress test negative for ischemia ef 69% (results in care everywhere 12-22-2016) and normal echo results 08-13-2014 in care everywhere;   pt had cardiac cath @[facility]  03-17-2017 (in epic) no angiographic evidence of CAD and LVEF 65%, non-cardiac chest pain   History of esophageal spasm    per pt hx chest pain from to no be non-cardiac but esophageal spasm and takes protonix which pt stated no spasm's taking protonix   History of kidney stones    History of peptic ulcer 2016   History of recurrent UTIs    Hyperlipidemia    Hypertension    followed by pcp   Migraines    followed by pcp   OA (osteoarthritis)    Pneumonia    Renal calculus, left    Wears glasses     Past Surgical History:  Procedure Laterality Date   ABDOMINOPLASTY     ANTERIOR CRUCIATE LIGAMENT (ACL) REVISION Left 03-09-2018  @[facility]   arthroscopy and quadriceps tendon graft   capel tunnel Bilateral 2022   wrist   CHOLECYSTECTOMY OPEN  3567   AND UMBILICIAL HERNIA REPAIR AND ABDOMINOPLASTY   CYSTOSCOPY W/ URETERAL STENT PLACEMENT Left 05/31/2020   Procedure: CYSTOSCOPY WITH RETROGRADE PYELOGRAM/URETERAL STENT PLACEMENT;  Surgeon: Janith Lima, MD;  Location: [facility];  Service: Urology;  Laterality: Left;   CYSTOSCOPY/RETROGRADE/URETEROSCOPY/STONE EXTRACTION WITH BASKET Right 02/25/2020   Procedure: CYSTOSCOPY RETROGRADE/ URETEROSCOPY/STONE EXTRACTION WITH BASKET/URETERAL STENT PLACEMENT ;  Surgeon: Cleon Gustin, MD;  Location: [facility];  Service: Urology;  Laterality: Right;    CYSTOSCOPY/RETROGRADE/URETEROSCOPY/STONE EXTRACTION WITH BASKET  2012   CYSTOSCOPY/RETROGRADE/URETEROSCOPY/STONE EXTRACTION WITH BASKET Left 06/11/2020   Procedure: CYSTOSCOPY/RETROGRADE/URETEROSCOPY/ LASER LITHOTRIPSY/ STONE EXTRACTION WITH BASKET/ LEFT STENT EXCHANGE;  Surgeon: Janith Lima, MD;  Location: [facility];  Service: Urology;  Laterality: Left;  ONLY NEEDS 45 MIN   HERNIA REPAIR     KNEE ARTHROSCOPY W/ ACL RECONSTRUCTION Left 2002   KNEE ARTHROSCOPY W/ SYNOVECTOMY Left 07-15-2018  @[facility]    lysis adhesions   KNEE SURGERY Left 2004 and 2017   revision scar tissue   LEFT HEART CATH AND CORONARY ANGIOGRAPHY N/A 03/17/2017   Procedure: LEFT HEART CATH AND CORONARY ANGIOGRAPHY;  Surgeon: Burnell Blanks, MD;  Location: [facility];  Service: Cardiovascular;  Laterality: N/A;   LUMBAR SPINE SURGERY  07-15-2019  @[facility]    L4--5 disectomy decompression;  L5--S1 laminectomy   PELVIC LAPAROSCOPY  1992   for endometriosis   POSTERIOR LUMBAR FUSION  12-20-2019  @[facility]    L4--5 revision laminectomy and fusion   SHOULDER ARTHROSCOPY Bilateral 2022   TOTAL LAPAROSCOPIC HYSTERECTOMY WITH BILATERAL SALPINGO OOPHORECTOMY  02/26/2016    MEDICATIONS:  amLODipine-valsartan (EXFORGE) 10-160 MG tablet   Coenzyme Q10 (COQ-10) 100 MG capsule   docusate sodium (COLACE) 100 MG capsule   DULoxetine (CYMBALTA) 60 MG capsule   escitalopram (LEXAPRO) 20 MG tablet   Fremanezumab-vfrm (AJOVY) 225 MG/1.5ML SOAJ   gabapentin (NEURONTIN) 300 MG capsule   HUMIRA PEN 40 MG/0.4ML PNKT   Multiple Vitamin (MULTIVITAMIN ADULT PO)   Omega-3 1000 MG CAPS   ondansetron (ZOFRAN) 4 MG tablet   oxyCODONE-acetaminophen (PERCOCET) 5-325 MG tablet   pantoprazole (PROTONIX) 40 MG tablet   Potassium Citrate 15 MEQ (1620 MG) TBCR   pravastatin (PRAVACHOL) 20 MG tablet   prednisoLONE acetate (PRED FORTE) 1 % ophthalmic suspension   Probiotic Product (PROBIOTIC DAILY PO)   Red Yeast Rice  Extract 600 MG CAPS   tiZANidine (ZANAFLEX) 4 MG capsule   topiramate (TOPAMAX) 50 MG tablet   Turmeric (QC TUMERIC COMPLEX PO)   UBRELVY 50 MG TABS   No current facility-administered medications for this encounter.   Konrad Felix Ward, PA-C [facility] [phone]

## 2021-06-21 ENCOUNTER — Ambulatory Visit (HOSPITAL_COMMUNITY): Payer: BC Managed Care – PPO | Admitting: Anesthesiology

## 2021-06-21 ENCOUNTER — Ambulatory Visit (HOSPITAL_COMMUNITY): Payer: BC Managed Care – PPO

## 2021-06-21 ENCOUNTER — Encounter (HOSPITAL_COMMUNITY): Payer: Self-pay | Admitting: Urology

## 2021-06-21 ENCOUNTER — Ambulatory Visit (HOSPITAL_COMMUNITY): Payer: BC Managed Care – PPO | Admitting: Physician Assistant

## 2021-06-21 ENCOUNTER — Ambulatory Visit (HOSPITAL_COMMUNITY)
Admission: RE | Admit: 2021-06-21 | Discharge: 2021-06-21 | Disposition: A | Discharge status: Home / Self Care | Payer: BC Managed Care – PPO | Attending: Urology | Admitting: Urology

## 2021-06-21 ENCOUNTER — Encounter (HOSPITAL_COMMUNITY)
Admission: RE | Disposition: A | Discharge status: Home / Self Care | Payer: Self-pay | Source: Home / Self Care | Attending: Urology

## 2021-06-21 DIAGNOSIS — N132 Hydronephrosis with renal and ureteral calculous obstruction: Secondary | ICD-10-CM | POA: Diagnosis not present

## 2021-06-21 DIAGNOSIS — I10 Essential (primary) hypertension: Secondary | ICD-10-CM | POA: Diagnosis not present

## 2021-06-21 DIAGNOSIS — Z87891 Personal history of nicotine dependence: Secondary | ICD-10-CM | POA: Insufficient documentation

## 2021-06-21 DIAGNOSIS — Z87442 Personal history of urinary calculi: Secondary | ICD-10-CM | POA: Insufficient documentation

## 2021-06-21 DIAGNOSIS — N200 Calculus of kidney: Secondary | ICD-10-CM | POA: Diagnosis not present

## 2021-06-21 DIAGNOSIS — Z87440 Personal history of urinary (tract) infections: Secondary | ICD-10-CM | POA: Insufficient documentation

## 2021-06-21 DIAGNOSIS — N281 Cyst of kidney, acquired: Secondary | ICD-10-CM | POA: Diagnosis not present

## 2021-06-21 DIAGNOSIS — Z8616 Personal history of COVID-19: Secondary | ICD-10-CM | POA: Diagnosis not present

## 2021-06-21 DIAGNOSIS — Z79899 Other long term (current) drug therapy: Secondary | ICD-10-CM | POA: Insufficient documentation

## 2021-06-21 DIAGNOSIS — K219 Gastro-esophageal reflux disease without esophagitis: Secondary | ICD-10-CM | POA: Diagnosis not present

## 2021-06-21 DIAGNOSIS — E785 Hyperlipidemia, unspecified: Secondary | ICD-10-CM | POA: Diagnosis not present

## 2021-06-21 HISTORY — PX: CYSTOSCOPY/URETEROSCOPY/HOLMIUM LASER/STENT PLACEMENT: SHX6546

## 2021-06-21 SURGERY — CYSTOSCOPY/URETEROSCOPY/HOLMIUM LASER/STENT PLACEMENT
Anesthesia: General | Laterality: Bilateral

## 2021-06-21 MED ORDER — OXYCODONE-ACETAMINOPHEN 5-325 MG PO TABS: 1.0000 | ORAL_TABLET | ORAL | 0 refills | Status: DC | PRN

## 2021-06-21 MED ORDER — SCOPOLAMINE 1 MG/3DAYS TD PT72
1.0000 | MEDICATED_PATCH | TRANSDERMAL | Status: DC
  Administered 2021-06-21: 1.5 mg via TRANSDERMAL

## 2021-06-21 MED ORDER — MIDAZOLAM HCL 2 MG/2ML IJ SOLN
INTRAMUSCULAR | Status: AC
  Filled 2021-06-21: qty 2

## 2021-06-21 MED ORDER — 0.9 % SODIUM CHLORIDE (POUR BTL) OPTIME
TOPICAL | Status: DC | PRN
  Administered 2021-06-21: 1000 mL

## 2021-06-21 MED ORDER — PROPOFOL 10 MG/ML IV BOLUS
INTRAVENOUS | Status: DC | PRN
  Administered 2021-06-21: 200 mg via INTRAVENOUS

## 2021-06-21 MED ORDER — LIDOCAINE 2% (20 MG/ML) 5 ML SYRINGE
INTRAMUSCULAR | Status: DC | PRN
Start: 2021-06-21 — End: 2021-06-21
  Administered 2021-06-21: 80 mg via INTRAVENOUS

## 2021-06-21 MED ORDER — DEXAMETHASONE SODIUM PHOSPHATE 10 MG/ML IJ SOLN
INTRAMUSCULAR | Status: AC
  Filled 2021-06-21: qty 1

## 2021-06-21 MED ORDER — DIPHENHYDRAMINE HCL 50 MG/ML IJ SOLN
INTRAMUSCULAR | Status: DC | PRN
  Administered 2021-06-21: 25 mg via INTRAVENOUS

## 2021-06-21 MED ORDER — SCOPOLAMINE 1 MG/3DAYS TD PT72
MEDICATED_PATCH | TRANSDERMAL | Status: AC
  Filled 2021-06-21: qty 1

## 2021-06-21 MED ORDER — FENTANYL CITRATE PF 50 MCG/ML IJ SOSY: 25.0000 ug | PREFILLED_SYRINGE | INTRAMUSCULAR | Status: DC | PRN

## 2021-06-21 MED ORDER — CHLORHEXIDINE GLUCONATE 0.12 % MT SOLN
15.0000 mL | Freq: Once | OROMUCOSAL | Status: AC
  Administered 2021-06-21: 15 mL via OROMUCOSAL

## 2021-06-21 MED ORDER — FENTANYL CITRATE (PF) 100 MCG/2ML IJ SOLN
INTRAMUSCULAR | Status: DC | PRN
  Administered 2021-06-21 (×2): 25 ug via INTRAVENOUS

## 2021-06-21 MED ORDER — ACETAMINOPHEN 500 MG PO TABS
1000.0000 mg | ORAL_TABLET | Freq: Once | ORAL | Status: AC
  Administered 2021-06-21: 1000 mg via ORAL
  Filled 2021-06-21: qty 2

## 2021-06-21 MED ORDER — ONDANSETRON HCL 4 MG/2ML IJ SOLN
INTRAMUSCULAR | Status: DC | PRN
  Administered 2021-06-21: 4 mg via INTRAVENOUS

## 2021-06-21 MED ORDER — LACTATED RINGERS IV SOLN: INTRAVENOUS | Status: DC

## 2021-06-21 MED ORDER — DIPHENHYDRAMINE HCL 50 MG/ML IJ SOLN
INTRAMUSCULAR | Status: AC
  Filled 2021-06-21: qty 1

## 2021-06-21 MED ORDER — SODIUM CHLORIDE 0.9 % IR SOLN
Status: DC | PRN
  Administered 2021-06-21: 3000 mL

## 2021-06-21 MED ORDER — PROPOFOL 10 MG/ML IV BOLUS
INTRAVENOUS | Status: AC
  Filled 2021-06-21: qty 20

## 2021-06-21 MED ORDER — FENTANYL CITRATE (PF) 100 MCG/2ML IJ SOLN
INTRAMUSCULAR | Status: AC
  Filled 2021-06-21: qty 2

## 2021-06-21 MED ORDER — ORAL CARE MOUTH RINSE: 15.0000 mL | Freq: Once | OROMUCOSAL | Status: AC

## 2021-06-21 MED ORDER — DEXAMETHASONE SODIUM PHOSPHATE 10 MG/ML IJ SOLN
INTRAMUSCULAR | Status: DC | PRN
  Administered 2021-06-21: 4 mg via INTRAVENOUS

## 2021-06-21 MED ORDER — IOHEXOL 300 MG/ML  SOLN
INTRAMUSCULAR | Status: DC | PRN
  Administered 2021-06-21: 6 mL

## 2021-06-21 MED ORDER — CEPHALEXIN 500 MG PO CAPS: 500.0000 mg | ORAL_CAPSULE | Freq: Two times a day (BID) | ORAL | 0 refills | Status: AC

## 2021-06-21 MED ORDER — ONDANSETRON HCL 4 MG/2ML IJ SOLN
INTRAMUSCULAR | Status: AC
  Filled 2021-06-21: qty 2

## 2021-06-21 MED ORDER — LIDOCAINE HCL (PF) 2 % IJ SOLN
INTRAMUSCULAR | Status: AC
  Filled 2021-06-21: qty 5

## 2021-06-21 MED ORDER — CEFAZOLIN SODIUM-DEXTROSE 2-4 GM/100ML-% IV SOLN
2.0000 g | Freq: Once | INTRAVENOUS | Status: AC
  Administered 2021-06-21: 2 g via INTRAVENOUS
  Filled 2021-06-21: qty 100

## 2021-06-21 MED ORDER — MIDAZOLAM HCL 5 MG/5ML IJ SOLN
INTRAMUSCULAR | Status: DC | PRN
  Administered 2021-06-21: 2 mg via INTRAVENOUS

## 2021-06-21 SURGICAL SUPPLY — 26 items
APL SKNCLS STERI-STRIP NONHPOA (GAUZE/BANDAGES/DRESSINGS) ×1
BAG URO CATCHER STRL LF (MISCELLANEOUS) ×2 IMPLANT
BASKET ZERO TIP NITINOL 2.4FR (BASKET) ×2 IMPLANT
BENZOIN TINCTURE PRP APPL 2/3 (GAUZE/BANDAGES/DRESSINGS) ×2 IMPLANT
BSKT STON RTRVL ZERO TP 2.4FR (BASKET) ×1
CATH URETL OPEN 5X70 (CATHETERS) ×2 IMPLANT
CLOTH BEACON ORANGE TIMEOUT ST (SAFETY) ×2 IMPLANT
COVER DOME SNAP 22 D (MISCELLANEOUS) ×2 IMPLANT
DRSG TEGADERM 2-3/8X2-3/4 SM (GAUZE/BANDAGES/DRESSINGS) ×2 IMPLANT
FIBER LASER MOSES 200 DFL (Laser) IMPLANT
GLOVE SURG ENC TEXT LTX SZ7 (GLOVE) ×2 IMPLANT
GOWN STRL REUS W/TWL LRG LVL3 (GOWN DISPOSABLE) ×2 IMPLANT
GUIDEWIRE STR DUAL SENSOR (WIRE) ×6 IMPLANT
GUIDEWIRE ZIPWRE .038 STRAIGHT (WIRE) ×2 IMPLANT
KIT TURNOVER KIT A (KITS) IMPLANT
LASER FIB FLEXIVA PULSE ID 365 (Laser) IMPLANT
MANIFOLD NEPTUNE II (INSTRUMENTS) ×2 IMPLANT
PACK CYSTO (CUSTOM PROCEDURE TRAY) ×2 IMPLANT
SHEATH DILATOR SET 8/10 (MISCELLANEOUS) ×2 IMPLANT
SHEATH URETERAL 12FR 45CM (SHEATH) IMPLANT
SHEATH URETERAL 12FRX35CM (MISCELLANEOUS) ×2 IMPLANT
STENT URET 6FRX24 CONTOUR (STENTS) ×4 IMPLANT
TRACTIP FLEXIVA PULS ID 200XHI (Laser) ×1 IMPLANT
TRACTIP FLEXIVA PULSE ID 200 (Laser) ×2
TUBING CONNECTING 10 (TUBING) ×2 IMPLANT
TUBING UROLOGY SET (TUBING) ×2 IMPLANT

## 2021-06-21 NOTE — H&P (Signed)
Office Visit Report 06/04/2021    Anna Wilson         MRN: [id number] PRIMARY CARE:     REFERRING:    DOB: [birth year], 50 year old Female PROVIDER:  Rexene Alberts, M.D.  SSN:  LOCATION:  [facility] [phone]    CC/HPI: Anna Wilson is a 50 y.o. female who is seen in follow-up with history of bilateral renal stones, recurrent urinary tract infections.   #1. Bilateral renal stones:  -2021:  -R URS/extraction by Dr. Alyson Ingles in 02/2020 for a 32m distal stone.  -L URS/LL in 06/2020 for 380msmall left upper pole stone, no ureteral stone identified. Evidence of intraparenchymal stones present.  -Stone analysis: Carbonate apatite 90%, calcium oxalate dihydrate 10%  -24-hour urine 03/2020: Low urine volume 0.79L, marked hypocitraturia 86 Mg/day  -CT A/P 05/09/2021 with bilateral nonobstructing stones all measuring less than 5 mm with no evidence of ureteral stones or hydronephrosis.  -She does have intermittent right-sided flank pain. She is requesting intervention for her small renal stones given intermittent pain, stone composition and recurrent infections   #2. Simple appearing right pararenal cyst: CT A/P 05/09/2021 with 2 cm small right renal cyst again noted. This is been seen on prior MRI lumbar spine noted to be simple in nature. Prior MRI lumbar spine in 04/2021 also noted 7 mm lesion in her left kidney too small to characterize however statistically likely also a simple cyst.   3. Recurrent urinary tract infections: She was hospitalized in 11/2020 with cystitis. UCx 11/29/2020 <10K CT at that time demonstrated insignificant growth. Bilateral nonobstructing stones and no obstructive uropathy, hydronephrosis or other abnormality.   Patient currently denies fever, chills, sweats, nausea, vomiting, abdominal or flank pain, gross hematuria or dysuria.     ALLERGIES: NSAIDs - Other Reaction, cant take due to history of bleeding ulcer sulfa - Other Reaction, "bleeding"    MEDICATIONS: Macrobid 100 mg capsule 1 capsule PO Q HS  Adderall Xr 30 mg capsule, ext release 24 hr  Ajovy Syringe 225 mg/1.5 ml syringe  Amlodipine-Valsartan 10 mg-320 mg tablet  Docusate Sodium 250 mg capsule  Duloxetine Hcl 60 mg capsule,delayed release  Gabapentin  Humira 40 mg/0.8 ml syringe kit  Lexapro 10 mg tablet  Multiple Vitamin  Omega 3  Pantoprazole Sodium 40 mg tablet, delayed release  Pravastatin Sodium 20 mg tablet  Sumatriptan Succinate 100 mg tablet PRN  Topamax 50 mg tablet  Ubrelvy 50 mg tablet    GU PSH: Cystoscopy Insert Stent, Left - 05/31/2020, Right - 02/25/2020 Hysterectomy Ureteroscopic laser litho, Left - 06/11/2020 Ureteroscopic stone removal, Right - 02/25/2020      PSH Notes: Tummy Tuck-2009   microdiscectomy-2020   laparoscopy for endometriosis-1994  NON-GU PSH: Cholecystectomy (laparoscopic) Hernia Repair Knee Arthroscopy/surgery Lumbar Spine Fusion         GU PMH: Flank Pain - 05/08/2021, - 05/07/2021, - 05/31/2020 Obstructive and reflux uropathy, Unspec - 05/07/2021 Renal calculus - 04/02/2021, - 12/06/2020, - 07/11/2020, - 05/31/2020, - 04/18/2020 Acute Cystitis/UTI - 12/26/2020, - 12/06/2020 Ureteral calculus - 05/31/2020, - 04/18/2020, - 03/09/2020    NON-GU PMH: Acute gastric ulcer with hemorrhage Ankylosing spondylitis of unspecified sites in spine Anxiety Arthritis Attention-deficit hyperactivity disorder, unspecified type Fibromyalgia GERD Hypercholesterolemia Hypertension    FAMILY HISTORY: Asthma - Runs in Family Hypertension - Runs in Family Kidney Stones - Brother    Notes: 1 son; 2 daughters   SOCIAL HISTORY: Marital Status: Married Preferred Language: English; Ethnicity: Not Hispanic Or  Latino; Race: White Current Smoking Status: Patient does not smoke anymore. Has not smoked since 02/09/1995. Smoked for 7 years.  <DIV'  Tobacco Use Assessment Completed:  Used Tobacco in last 30 days?   Social Drinker.  Drinks 3  caffeinated drinks per day. Patient's occupation is/was disabled.    REVIEW OF SYSTEMS:     GU Review Female:  Patient reports frequent urination, hard to postpone urination, burning /pain with urination, get up at night to urinate, and leakage of urine. Patient denies stream starts and stops, trouble starting your stream, have to strain to urinate, and being pregnant.    Gastrointestinal (Upper):  Patient reports nausea and indigestion/ heartburn. Patient denies vomiting.    Gastrointestinal (Lower):  Patient reports constipation. Patient denies diarrhea.    Constitutional:  Patient reports night sweats and fatigue. Patient denies fever and weight loss.    Skin:  Patient reports itching. Patient denies skin rash/ lesion.    Eyes:  Patient reports blurred vision and double vision.     Ears/ Nose/ Throat:  Patient reports sore throat. Patient denies sinus problems.    Hematologic/Lymphatic:  Patient reports easy bruising. Patient denies swollen glands.    Cardiovascular:  Patient reports leg swelling. Patient denies chest pains.    Respiratory:  Patient denies cough and shortness of breath.    Endocrine:  Patient reports excessive thirst.     Musculoskeletal:  Patient reports back pain and joint pain.     Neurological:  Patient reports headaches and dizziness.     Psychologic:  Patient reports depression and anxiety.     VITAL SIGNS: None     MULTI-SYSTEM PHYSICAL EXAMINATION:      Constitutional: Well-nourished. No physical deformities. Normally developed. Good grooming.     Respiratory: No labored breathing, no use of accessory muscles.      Cardiovascular: Normal temperature, normal extremity pulses, no swelling, no varicosities.     Gastrointestinal: No mass, no tenderness, no rigidity, non obese abdomen.            Complexity of Data:   Source Of History:  Patient, Medical Record Summary  Urine Test Review:  Urinalysis  X-Ray Review: C.T. Abdomen/Pelvis: Reviewed Films. Reviewed  Report. Discussed With Patient.     Notes:  CLINICAL DATA: Right flank pain for 3 days. Gross hematuria.  Nephrolithiasis.   EXAM:  CT ABDOMEN AND PELVIS WITHOUT CONTRAST   TECHNIQUE:  Multidetector CT imaging of the abdomen and pelvis was performed  following the standard protocol without IV contrast.   COMPARISON: 11/29/2020 from [place]:  Lower chest: No acute findings.   Hepatobiliary: No mass visualized on this unenhanced exam. Prior  cholecystectomy. No evidence of biliary obstruction.   Pancreas: No mass or inflammatory process visualized on this  unenhanced exam.   Spleen: Within normal limits in size.   Adrenals/Urinary tract: Multiple tiny less than 5 mm renal calculi  are again seen bilaterally. No evidence of ureteral calculi or  hydronephrosis. Small right renal sinus cyst is again noted.  Unremarkable unopacified urinary bladder.   Stomach/Bowel: No evidence of obstruction, inflammatory process, or  abnormal fluid collections. Normal appendix visualized.   Vascular/Lymphatic: No pathologically enlarged lymph nodes  identified. No evidence of abdominal aortic aneurysm. Aortic  atherosclerotic calcification noted.   Reproductive: Prior hysterectomy noted. Adnexal regions are  unremarkable in appearance.   Other: None.   Musculoskeletal: No suspicious bone lesions identified. Lumbar spine  fusion hardware again seen at  L4-5.   IMPRESSION:  Bilateral nephrolithiasis. No evidence of ureteral calculi,  hydronephrosis, or other acute findings.   Aortic Atherosclerosis (ICD10-I70.0).    Electronically Signed  By: Marlaine Hind M.D.  On: 05/09/2021 10:46   PROCEDURES:    Urinalysis w/Scope  Dipstick Dipstick Cont'd Micro  Color: Yellow Bilirubin: Neg mg/dL WBC/hpf: 0 - 5/hpf  Appearance: Clear Ketones: Neg mg/dL RBC/hpf: 0 - 2/hpf  Specific Gravity: 1.020 Blood: Neg ery/uL Bacteria: Rare (0-9/hpf)  pH: 6.0 Protein: Neg mg/dL  Cystals: NS (Not Seen)  Glucose: Neg mg/dL Urobilinogen: 0.2 mg/dL Casts: Hyaline   Nitrites: Neg Trichomonas: Not Present   Leukocyte Esterase: Trace leu/uL Mucous: Present    Epithelial Cells: 0 - 5/hpf    Yeast: NS (Not Seen)    Sperm: Not Present    ASSESSMENT:     ICD-10 Details  1 GU:  Renal calculus - N20.0   2  Renal cyst - N28.1   3  Chronic cystitis (w/o hematuria) - N30.20    PLAN:   Medications  New Meds: Potassium Citrate Er 15 meq (1,620 mg) tablet, extended release 1 tablet PO BID #60 11 Refill(s)    Orders  Labs CULTURE, URINE  Document  Letter(s):  Created for Patient: Clinical Summary   Notes:  #1. Bilateral renal stones:  -I reviewed most recent CT A/P 05/08/2021 with multiple bilateral renal stones measuring up to 5 mm. She is having some intermittent flank pain. We discussed that her pain could be related to a musculoskeletal source however given stones present with recurrent infections and pain, I did offer bilateral ureteroscopy with laser lithotripsy. She would like to proceed with this option. We did discuss risk and benefits as below.  -We will send preoperative urine for culture. It is positive, will treat and ensure that her culture is negative prior to surgery.  -Surgery letter sent for bilateral ureteroscopy  -We will plan to undergo repeat 24-hour urine after treating her stone and she may benefit from starting potassium citrate she did have bothersome side effects with this medication in the past.   We discussed the options for management of kidney stones, including observation, ESWL, ureteroscopy with laser lithotripsy, and PCNL. The risks and benefits of each option were discussed.  For observation I described the risks which include but are not limited to silent renal damage, life-threatening infection, need for emergent surgery, failure to pass stone, and pain.   ESWL: risks and benefits of ESWL were outlined including infection, bleeding, pain,  steinstrasse, kidney injury, need for ancillary treatments, and global anesthesia risks including but not limited to CVA, MI, DVT, PE, pneumonia, and death.   Ureteroscopy: risks and benefits of ureteroscopy were outlined, including infection, bleeding, pain, temporary ureteral stent and associated stent bother, ureteral injury, ureteral stricture, need for ancillary treatments, and global anesthesia risks including but not limited to CVA, MI, DVT, PE, pneumonia, and death.   PCNL: risks and benefits of PCNL were outlined including infection, bleeding, blood transfusion, pain, pneumothorax, bowel injury, persistent urine leak, positioning injury, inability to clear stone burden, renal laceration, arterial venous fistula or malformation, need for ancillary treatments, and global anesthesia risks including but not limited to CVA, MI, DVT, PE, pneumonia, and death.   We discussed dietary methods for stone prevention including the following: increased water intake to 2-3 liters per day, add lemon or lemon concentrate to water to increase citrate which is beneficial for stone prevention, limiting dietary sodium to less than 2000 mg  per day, limiting animal protein to less than 2 servings (16 ounces/day), and limiting foods high in oxalate content (spinach, beans, chocolate, etc.).   2. Recurrent UTI she was hospitalized in 11/2018 with cystitis. She denies dysuria today. We will send urine for culture today.   3. Bilateral simple renal cyst: I did review recent MRI in September 2022 that notes 2 cm right pararenal cyst which was seen on recent CT A/P in 04/2021. This also demonstrated a 7 mm lesion too small to accurately characterize however statistically a simple appearing cyst in her left kidney. We discussed the incidence of cyst about 80% of the general population bedtime routine age 67. No intervention is required unless this grows larger bruise mass-effect or becomes complex.      Urology Preoperative  H&P   Chief Complaint: bilateral renal stones  History of Present Illness: Anna Wilson is a 50 y.o. female with bilateral renal stones here for cysto, b/l RPG, b/l URS/LL. Denies fevers, chills, dysuria.    Past Medical History:  Diagnosis Date   Anemia    Ankylosing spondylitis ([place])    rhemotology--- dr Amil Amen,  treated with humira injection   Anxiety    Carpal tunnel syndrome, bilateral    DDD (degenerative disc disease), lumbosacral    Frequency of urination    GERD (gastroesophageal reflux disease)    History of 2019 novel coronavirus disease (COVID-19) 03/24/2020   per pt had mild symptoms that resolved in 10 days and lingering cough resolved few weeks later   History of chest pain    in epic was referred to cardiology, dr Johnsie Cancel (note 01-28-2017) by pt's pcp;  previous nuclear stress test negative for ischemia ef 69% (results in care everywhere 12-22-2016) and normal echo results 08-13-2014 in care everywhere;   pt had cardiac cath _0  03-17-2017 (in epic) no angiographic evidence of CAD and LVEF 65%, non-cardiac chest pain   History of esophageal spasm    per pt hx chest pain from to no be non-cardiac but esophageal spasm and takes protonix which pt stated no spasm's taking protonix   History of kidney stones    History of peptic ulcer 2016   History of recurrent UTIs    Hyperlipidemia    Hypertension    followed by pcp   Migraines    followed by pcp   OA (osteoarthritis)    Pneumonia    Renal calculus, left    Wears glasses     Past Surgical History:  Procedure Laterality Date   ABDOMINOPLASTY     ANTERIOR CRUCIATE LIGAMENT (ACL) REVISION Left 03-09-2018  _1    arthroscopy and quadriceps tendon graft   capel tunnel Bilateral 2022   wrist   CHOLECYSTECTOMY OPEN  1610   AND UMBILICIAL HERNIA REPAIR AND ABDOMINOPLASTY   CYSTOSCOPY W/ URETERAL STENT PLACEMENT Left 05/31/2020   Procedure: CYSTOSCOPY WITH RETROGRADE PYELOGRAM/URETERAL STENT PLACEMENT;  Surgeon:  Janith Lima, MD;  Location: [facility];  Service: Urology;  Laterality: Left;   CYSTOSCOPY/RETROGRADE/URETEROSCOPY/STONE EXTRACTION WITH BASKET Right 02/25/2020   Procedure: CYSTOSCOPY RETROGRADE/ URETEROSCOPY/STONE EXTRACTION WITH BASKET/URETERAL STENT PLACEMENT ;  Surgeon: Cleon Gustin, MD;  Location: [facility];  Service: Urology;  Laterality: Right;   CYSTOSCOPY/RETROGRADE/URETEROSCOPY/STONE EXTRACTION WITH BASKET  2012   CYSTOSCOPY/RETROGRADE/URETEROSCOPY/STONE EXTRACTION WITH BASKET Left 06/11/2020   Procedure: CYSTOSCOPY/RETROGRADE/URETEROSCOPY/ LASER LITHOTRIPSY/ STONE EXTRACTION WITH BASKET/ LEFT STENT EXCHANGE;  Surgeon: Janith Lima, MD;  Location: [facility];  Service: Urology;  Laterality: Left;  ONLY NEEDS 45  MIN   HERNIA REPAIR     KNEE ARTHROSCOPY W/ ACL RECONSTRUCTION Left 2002   KNEE ARTHROSCOPY W/ SYNOVECTOMY Left 07-15-2018  _0    lysis adhesions   KNEE SURGERY Left 2004 and 2017   revision scar tissue   LEFT HEART CATH AND CORONARY ANGIOGRAPHY N/A 03/17/2017   Procedure: LEFT HEART CATH AND CORONARY ANGIOGRAPHY;  Surgeon: Burnell Blanks, MD;  Location: [place] [facility];  Service: Cardiovascular;  Laterality: N/A;   LUMBAR SPINE SURGERY  07-15-2019  _1    L4--5 disectomy decompression;  L5--S1 laminectomy   PELVIC LAPAROSCOPY  1992   for endometriosis   POSTERIOR LUMBAR FUSION  12-20-2019  _2    L4--5 revision laminectomy and fusion   SHOULDER ARTHROSCOPY Bilateral 2022   TOTAL LAPAROSCOPIC HYSTERECTOMY WITH BILATERAL SALPINGO OOPHORECTOMY  02/26/2016    Allergies:  Allergies  Allergen Reactions   Nsaids Other (See Comments)    Ulcers   Sulfa Antibiotics Other (See Comments)    Pt states "I bleed from my body orficese when I take sulfa drugs"   Sulfasalazine Other (See Comments)    Pt states "I bleed from my body orficese when I take sulfa drugs"    Family History  Problem Relation Age of Onset   Hypertension Mother     Hypertension Father    Hypothyroidism Brother    Rheum arthritis Daughter    Asthma Daughter    Hypothyroidism Brother    Asthma Son     Social History:  reports that she quit smoking about 27 years ago. Her smoking use included cigarettes. She has never used smokeless tobacco. She reports current alcohol use. She reports that she does not use drugs.  ROS: A complete review of systems was performed.  All systems are negative except for pertinent findings as noted.  Physical Exam:  Vital signs in last 24 hours: Temp:  [98.2 F (36.8 C)] 98.2 F (36.8 C) (11/11 1100) Pulse Rate:  [76] 76 (11/11 1100) Resp:  [16] 16 (11/11 1100) BP: (124)/(84) 124/84 (11/11 1100) SpO2:  [100 %] 100 % (11/11 1100) Weight:  [68.4 kg] 68.4 kg (11/11 1100) Constitutional:  Alert and oriented, No acute distress Cardiovascular: Regular rate and rhythm Respiratory: Normal respiratory effort, Lungs clear bilaterally GI: Abdomen is soft, nontender, nondistended, no abdominal masses GU: No CVA tenderness Lymphatic: No lymphadenopathy Neurologic: Grossly intact, no focal deficits Psychiatric: Normal mood and affect  Laboratory Data:  Recent Labs    06/20/21 0815  WBC 6.6  HGB 13.2  HCT 41.2  PLT 314    Recent Labs    06/20/21 0815  NA 139  K 4.7  CL 107  GLUCOSE 105*  BUN 16  CALCIUM 9.7  CREATININE 0.98     No results found for this or any previous visit (from the past 24 hour(s)). No results found for this or any previous visit (from the past 240 hour(s)).  Renal Function: Recent Labs    06/20/21 0815  CREATININE 0.98   Estimated Creatinine Clearance: 66.3 mL/min (by C-G formula based on SCr of 0.98 mg/dL).  Radiologic Imaging: No results found.  I independently reviewed the above imaging studies.  Assessment and Plan Anna Wilson is a 50 y.o. female with bilateral renal stones here for cysto, b/l RPG, b/l URS/LL.  -The risks, benefits and alternatives of ccysto, b/l RPG,  b/l URS/LL b/l stent placement was discussed with the patient.  Risks include, but are not limited to: bleeding, urinary tract infection, ureteral injury,  ureteral stricture disease, chronic pain, urinary symptoms, bladder injury, stent migration, the need for nephrostomy tube placement, MI, CVA, DVT, PE and the inherent risks with general anesthesia.  The patient voices understanding and wishes to proceed.       Matt R. Katalaya Beel MD 06/21/2021, 1:49 PM  [facility] Pager: [phone]): [phone]

## 2021-06-21 NOTE — Anesthesia Procedure Notes (Signed)
Procedure Name: LMA Insertion Date/Time: 06/21/2021 2:25 PM Performed by: Milford Cage, CRNA Pre-anesthesia Checklist: Patient identified, Emergency Drugs available, Suction available and Patient being monitored Patient Re-evaluated:Patient Re-evaluated prior to induction Oxygen Delivery Method: Circle system utilized Preoxygenation: Pre-oxygenation with 100% oxygen Induction Type: IV induction Ventilation: Mask ventilation without difficulty LMA: LMA inserted LMA Size: 4.0 Number of attempts: 1 Tube secured with: Tape Dental Injury: Teeth and Oropharynx as per pre-operative assessment

## 2021-06-21 NOTE — Discharge Instructions (Signed)
[facility] [phone] Post Ureteroscopy With or Without Stent Instructions  Definitions:  Ureter: The duct that transports urine from the kidney to the bladder. Stent:   A plastic hollow tube that is placed into the ureter, from the kidney to the bladder to prevent the ureter from swelling shut.  GENERAL INSTRUCTIONS:  Despite the fact that no skin incisions were used, the area around the ureter and bladder is raw and irritated. The stent is a foreign body which will further irritate the bladder wall. This irritation is manifested by increased frequency of urination, both day and night, and by an increase in the urge to urinate. In some, the urge to urinate is present almost always. Sometimes the urge is strong enough that you may not be able to stop yourself from urinating. The only real cure is to remove the stent and then give time for the bladder wall to heal which can't be done until the danger of the ureter swelling shut has passed, which varies.  You may see some blood in your urine while the stent is in place and a few days afterwards. Do not be alarmed, even if the urine was clear for a while. Get off your feet and drink lots of fluids until clearing occurs. If you start to pass clots or don't improve, call us.  DIET: You may return to your normal diet immediately. Because of the raw surface of your bladder, alcohol, spicy foods, acid type foods and drinks with caffeine may cause irritation or frequency and should be used in moderation. To keep your urine flowing freely and to avoid constipation, drink plenty of fluids during the day ( 8-10 glasses ). Tip: Avoid cranberry juice because it is very acidic.  ACTIVITY: Your physical activity doesn't need to be restricted. However, if you are very active, you may see some blood in your urine. We suggest that you reduce your activity under these circumstances until the bleeding has stopped.  BOWELS: It is important to  keep your bowels regular during the postoperative period. Straining with bowel movements can cause bleeding. A bowel movement every other day is reasonable. Use a mild laxative if needed, such as Milk of Magnesia 2-3 tablespoons, or 2 Dulcolax tablets. Call if you continue to have problems. If you have been taking narcotics for pain, before, during or after your surgery, you may be constipated. Take a laxative if necessary.   MEDICATION: You should resume your pre-surgery medications unless told not to. In addition you will often be given an antibiotic to prevent infection. These should be taken as prescribed until the bottles are finished unless you are having an unusual reaction to one of the drugs.  PROBLEMS YOU SHOULD REPORT TO [place]: Fevers over 100.5 Fahrenheit. Heavy bleeding, or clots ( See above notes about blood in urine ). Inability to urinate. Drug reactions ( hives, rash, nausea, vomiting, diarrhea ). Severe burning or pain with urination that is not improving.  FOLLOW-UP: You will need a follow-up appointment to monitor your progress. Call for this appointment at the number listed above. Usually the first appointment will be about three to fourteen days after your surgery.  You may remove both ureteral stents on Monday AM by pulling on attached strings.  Abigayle Dockter may resume physical therapy exercises. If any questions, let me know. -Dr. Rexene Alberts

## 2021-06-21 NOTE — Transfer of Care (Signed)
Immediate Anesthesia Transfer of Care Note  Patient: Anna Wilson  Procedure(s) Performed: CYSTOSCOPY/RETROGRADE/URETEROSCOPY/HOLMIUM LASER/STENT PLACEMENT (Bilateral)  Patient Location: PACU  Anesthesia Type:General  Level of Consciousness: drowsy  Airway & Oxygen Therapy: Patient Spontanous Breathing and Patient connected to face mask oxygen  Post-op Assessment: Report given to RN and Post -op Vital signs reviewed and stable  Post vital signs: Reviewed and stable  Last Vitals:  Vitals Value Taken Time  BP 148/101 06/21/21 1557  Temp 37 C 06/21/21 1557  Pulse 81 06/21/21 1559  Resp 13 06/21/21 1559  SpO2 99 % 06/21/21 1559  Vitals shown include unvalidated device data.  Last Pain:  Vitals:   06/21/21 1557  TempSrc:   PainSc: Asleep      Patients Stated Pain Goal: 2 (59/45/85 [id number])  Complications: No notable events documented.

## 2021-06-21 NOTE — Op Note (Signed)
Operative Note  Preoperative diagnosis:  1.  Bilateral renal stones  Postoperative diagnosis: 1.  Bilateral renal stones  Procedure(s): 1.  Cystoscopy 2.  Bilateral ureteroscopy with laser lithotripsy and basket extraction of stones 3.  Bilateral retrograde pyelogram 4.  Bilateral ureteral stent placement 5. Fluoroscopy with intraoperative interpretation  Surgeon: Rexene Alberts, MD  Assistants:  None  Anesthesia:  General  Complications:  None  EBL:  Minimal  Specimens: 1. Stones for stone analysis (to be done at [facility])  Drains/Catheters: 1.  Right 6Fr x 24cm ureteral stent with tether string 2.  Left 6Fr x 24cm ureteral stent with tether string  Intraoperative findings:   Cystoscopy demonstrated no suspicious bladder lesions.  Bilateral ureteral orifices were in their normal orthotopic position. Right ureteroscopy demonstrated small approximately 4 mm right lower pole pole stone as well as 2 additional punctate stones in an upper pole and lower pole calyx.  These were successfully dusted.  A very small stone was basket extracted. Left ureteroscopy demonstrated a tiny punctate stone in the left lower pole.  There was an interpolar calyx with a narrow infundibulum.  I was able to dilate this with the scope.  There encountered approximately 4 separate very small stones.  These were all dusted.  A very small tiny fragment was basket extracted and sent for stone analysis.  The remaining fragments were all dust smaller than the fiber of the laser. Right retrograde pyelogram demonstrated no hydronephrosis.  There was a small amount of extravasation seen around the kidney consistent with pelvicalyceal flow.  There is no perforation of the collecting system seen. There was judicious use of irrigation.  Successful bilateral ureteral stent placement.  Indication:  Anna Wilson is a 50 y.o. female with a history of bilateral renal stones, right renal parapelvic cyst and  persistent intermittent right flank pain.  After a thorough discussion including all relevant risk benefits and alternatives, she has been brought to the operating today for bilateral retrograde pyelogram, bilateral ureteroscopy with laser this to be best extraction of stones.  Description of procedure: After informed consent was obtained from the patient, the patient was identified and taken to the operating room and placed in the supine position.  General anesthesia was administered as well as perioperative IV antibiotics.  At the beginning of the case, a time-out was performed to properly identify the patient, the surgery to be performed, and the surgical site.  Sequential compression devices were applied to the lower extremities at the beginning of the case for DVT prophylaxis.  The patient was then placed in the dorsal lithotomy supine position, prepped and draped in sterile fashion.  Preliminary scout fluoroscopy did not identify any calcifications. We then passed the 21-French rigid cystoscope through the urethra and into the bladder under vision without any difficulty. A systematic evaluation of the bladder revealed no evidence of any suspicious bladder lesions.  Ureteral orifices were in normal position.    Under cystoscopic and flouroscopic guidance, we cannulated the right ureteral orifice with a 5-French open-ended ureteral catheter and a gentle retrograde pyelogram was performed, revealing a normal caliber ureter without any filling defects. There was no hydronephrosis of the collecting system. A 0.038 sensor wire was then passed up to the level of the renal pelvis and secured to the drape as a safety wire. The ureteral catheter and cystoscope were removed, leaving the safety wire in place.   Under cystoscopic and flouroscopic guidance, we cannulated the left ureteral orifice with a 5-French open-ended  ureteral catheter and a gentle retrograde pyelogram was performed, revealing a normal caliber  ureter without any filling defects. There was no hydronephrosis of the collecting system. A 0.038 sensor wire was then passed up to the level of the renal pelvis and secured to the drape as a safety wire. The ureteral catheter and cystoscope were removed, leaving the safety wire in place.   A semi-rigid ureteroscope was passed alongside the wire up the distal right ureter which appeared normal. A second 0.038 sensor wire was passed under direct vision and the semirigid scope was removed.   A semi-rigid ureteroscope was passed alongside the wire up the distal left ureter which appeared normal. A second 0.038 sensor wire was passed under direct vision and the semirigid scope was removed.   An 8/10 Fr dilator and subsequently the inner sheath of a 12/14 ureteral access sheath was used to dilate the right ureter.  I then used a single-lumen digital flexible ureteroscope and advanced this over the wire into the kidney.  I then performed a thorough pyeloscopy and was able to identify the stones as above.  The only notable stone was a small approximately 3 mm right lower pole stone.  Using a 200 m holmium laser fiber, the stone was fragmented completely.  I did use a 0 tip basket and extracted a very tiny fragment.  The remaining stones were dust and smaller than the laser tip.  I repeated a right retrograde pyelogram.  There was some extravasation of contrast identified at the edge of the kidney.  This was thought to be due to pyelovenous backflow.  I surveyed the kidney with judicial use of irrigation and encountered no evidence of any perforation.  She does have a known right renal peripelvic cyst measuring approximately 2 cm.  This was not identified.  There is no large pocket of contrast extravasating into any cyst structure.  I withdrew the ureteroscope and the course of the right ureter noting no evidence of any stones and no significant trauma.  I left the sensor wire in place.  At the end the case, I did  pass up a 6 [place] by 24 cm right ureteral stent noting a curl within the renal pelvis and bladder respectively.  Note that a tether string was attached to the stent.  I was able to pass a single-lumen digital flexible ureteroscope over previously placed wire in the left ureter and advanced this up to the renal pelvis.  Pyeloscopy was performed noting findings as above.  I was able to dust a small left lower pole stone.  I also encountered an interpolar calyx with a narrowed infundibulum.  This was dilated.  I then encountered further stones and using the laser, proceeded to dust the stones into very small fragments.  I was able to extract 1 of these fragments with the basket.  The remaining fragments were smaller than the tip of the laser fiber.  I then performed repeat pyeloscopy noting no evidence of any further stones.  I performed a repeat left retrograde pyelogram noting findings as above.  Next, I slowly withdrew the ureteroscope down the course of the ureter noting no evidence of any stones in the ureter and no trauma of the ureter.  Over the previously placed wire, I passed a 6 [place] by 24 cm ureteral stent with a curl noted within the pelvis of the kidney and bladder respectively.  I left a tether string attached to the stent.  Note that we left a  long tether string attached to the distal end of the ureteral stent and it exited the urethral meatus and was secured to the inner thigh with a tegaderm adhesive.  The cystoscope was then advanced back into the bladder under vision.  We were able to see the distal stent coiling nicely within the bladder.  The bladder was then emptied with irrigation solution.  The cystoscope was then removed.    The patient tolerated the procedure well and there was no complication. Patient was awoken from anesthesia and taken to the recovery room in stable condition. I was present and scrubbed for the entirety of the case.  Plan:  Patient will be discharged home.  She  will remove her stents by pulling on attached string on Monday morning.  She will follow with me as scheduled.   Matt R. [facility]  Pager: [phone]

## 2021-06-21 NOTE — Anesthesia Preprocedure Evaluation (Addendum)
Anesthesia Evaluation  Patient identified by MRN, date of birth, ID band Patient awake    Reviewed: Allergy & Precautions, NPO status , Patient's Chart, lab work & pertinent test results  Airway Mallampati: I  TM Distance: >3 FB Neck ROM: Full    Dental no notable dental hx. (+) Teeth Intact, Dental Advisory Given   Pulmonary neg pulmonary ROS, former smoker,    Pulmonary exam normal breath sounds clear to auscultation       Cardiovascular hypertension, Pt. on medications Normal cardiovascular exam Rhythm:Regular Rate:Normal   EKG: 11/30/20 Rate 65 bpm  Sinus rhythm   CV: Cardiac Cath 03/17/2017  The left ventricular systolic function is normal. LV end diastolic pressure is normal. The left ventricular ejection fraction is greater than 65% by visual estimate. There is no mitral valve regurgitation.  1. No angiographic evidence of CAD 2. Normal LV systolic function 3. Normal LV pressures 4. The aortic root/ascending aorta appears to be normal in size.    Neuro/Psych  Headaches, PSYCHIATRIC DISORDERS Anxiety    GI/Hepatic Neg liver ROS, GERD  Controlled and Medicated,  Endo/Other  negative endocrine ROS  Renal/GU negative Renal ROS  negative genitourinary   Musculoskeletal  (+) Arthritis ,   Abdominal   Peds  Hematology negative hematology ROS (+)   Anesthesia Other Findings 50 y.o. former smoker with h/o HTN, ankylosing spondylitis, GERD, esophageal spasm, bilateral renal stones   Reproductive/Obstetrics                            Anesthesia Physical Anesthesia Plan  ASA: 2  Anesthesia Plan: General   Post-op Pain Management:    Induction: Intravenous  PONV Risk Score and Plan: 3 and Ondansetron, Dexamethasone and Midazolam  Airway Management Planned: LMA  Additional Equipment:   Intra-op Plan:   Post-operative Plan: Extubation in OR  Informed Consent: I have  reviewed the patients History and Physical, chart, labs and discussed the procedure including the risks, benefits and alternatives for the proposed anesthesia with the patient or authorized representative who has indicated his/her understanding and acceptance.     Dental advisory given  Plan Discussed with: CRNA  Anesthesia Plan Comments:         Anesthesia Quick Evaluation

## 2021-06-22 ENCOUNTER — Encounter (HOSPITAL_COMMUNITY): Payer: Self-pay | Admitting: Urology

## 2021-06-24 ENCOUNTER — Emergency Department (HOSPITAL_COMMUNITY)
Admission: EM | Admit: 2021-06-24 | Discharge: 2021-06-24 | Disposition: A | Discharge status: Home / Self Care | Payer: BC Managed Care – PPO | Attending: Emergency Medicine | Admitting: Emergency Medicine

## 2021-06-24 ENCOUNTER — Encounter (HOSPITAL_COMMUNITY): Payer: Self-pay

## 2021-06-24 ENCOUNTER — Encounter: Payer: Self-pay | Admitting: *Deleted

## 2021-06-24 ENCOUNTER — Emergency Department (HOSPITAL_COMMUNITY): Payer: BC Managed Care – PPO

## 2021-06-24 DIAGNOSIS — Z87891 Personal history of nicotine dependence: Secondary | ICD-10-CM | POA: Diagnosis not present

## 2021-06-24 DIAGNOSIS — Z8616 Personal history of COVID-19: Secondary | ICD-10-CM | POA: Diagnosis not present

## 2021-06-24 DIAGNOSIS — Z955 Presence of coronary angioplasty implant and graft: Secondary | ICD-10-CM | POA: Diagnosis not present

## 2021-06-24 DIAGNOSIS — I10 Essential (primary) hypertension: Secondary | ICD-10-CM | POA: Diagnosis not present

## 2021-06-24 DIAGNOSIS — R109 Unspecified abdominal pain: Secondary | ICD-10-CM | POA: Diagnosis not present

## 2021-06-24 DIAGNOSIS — N200 Calculus of kidney: Secondary | ICD-10-CM | POA: Diagnosis not present

## 2021-06-24 DIAGNOSIS — N1330 Unspecified hydronephrosis: Secondary | ICD-10-CM | POA: Diagnosis not present

## 2021-06-24 DIAGNOSIS — R110 Nausea: Secondary | ICD-10-CM | POA: Diagnosis not present

## 2021-06-24 DIAGNOSIS — Z79899 Other long term (current) drug therapy: Secondary | ICD-10-CM | POA: Insufficient documentation

## 2021-06-24 DIAGNOSIS — K219 Gastro-esophageal reflux disease without esophagitis: Secondary | ICD-10-CM | POA: Insufficient documentation

## 2021-06-24 DIAGNOSIS — I700 Atherosclerosis of aorta: Secondary | ICD-10-CM | POA: Diagnosis not present

## 2021-06-24 DIAGNOSIS — N23 Unspecified renal colic: Secondary | ICD-10-CM | POA: Diagnosis not present

## 2021-06-24 DIAGNOSIS — Z981 Arthrodesis status: Secondary | ICD-10-CM | POA: Diagnosis not present

## 2021-06-24 LAB — URINALYSIS, ROUTINE W REFLEX MICROSCOPIC
Bilirubin Urine: NEGATIVE
Glucose, UA: NEGATIVE mg/dL
Ketones, ur: NEGATIVE mg/dL
Nitrite: POSITIVE — AB
Protein, ur: 30 mg/dL — AB
RBC / HPF: >50 RBC/hpf — ABNORMAL HIGH (ref 0–5)
Specific Gravity, Urine: 1.013 (ref 1.005–1.030)
WBC, UA: >50 WBC/hpf — ABNORMAL HIGH (ref 0–5)
pH: 8 (ref 5.0–8.0)

## 2021-06-24 LAB — CBC WITH DIFFERENTIAL/PLATELET
Abs Immature Granulocytes: 0.05 10*3/uL (ref 0.00–0.07)
Basophils Absolute: 0.1 10*3/uL (ref 0.0–0.1)
Basophils Relative: 1 %
Eosinophils Absolute: 0.2 10*3/uL (ref 0.0–0.5)
Eosinophils Relative: 2 %
HCT: 37.3 % (ref 36.0–46.0)
Hemoglobin: 12.2 g/dL (ref 12.0–15.0)
Immature Granulocytes: 0 %
Lymphocytes Relative: 12 %
Lymphs Abs: 1.4 10*3/uL (ref 0.7–4.0)
MCH: 29.3 pg (ref 26.0–34.0)
MCHC: 32.7 g/dL (ref 30.0–36.0)
MCV: 89.4 fL (ref 80.0–100.0)
Monocytes Absolute: 0.8 10*3/uL (ref 0.1–1.0)
Monocytes Relative: 7 %
Neutro Abs: 9.6 10*3/uL — ABNORMAL HIGH (ref 1.7–7.7)
Neutrophils Relative %: 78 %
Platelets: 297 10*3/uL (ref 150–400)
RBC: 4.17 MIL/uL (ref 3.87–5.11)
RDW: 12.4 % (ref 11.5–15.5)
WBC: 12.2 10*3/uL — ABNORMAL HIGH (ref 4.0–10.5)
nRBC: 0 % (ref 0.0–0.2)

## 2021-06-24 LAB — LIPASE, BLOOD: Lipase: 30 U/L (ref 11–51)

## 2021-06-24 LAB — COMPREHENSIVE METABOLIC PANEL
ALT: 9 U/L (ref 0–44)
AST: 19 U/L (ref 15–41)
Albumin: 4.4 g/dL (ref 3.5–5.0)
Alkaline Phosphatase: 62 U/L (ref 38–126)
Anion gap: 10 (ref 5–15)
BUN: 18 mg/dL (ref 6–20)
CO2: 24 mmol/L (ref 22–32)
Calcium: 9.8 mg/dL (ref 8.9–10.3)
Chloride: 104 mmol/L (ref 98–111)
Creatinine, Ser: 1.54 mg/dL — ABNORMAL HIGH (ref 0.44–1.00)
GFR, Estimated: 41 mL/min — ABNORMAL LOW (ref >60)
Glucose, Bld: 137 mg/dL — ABNORMAL HIGH (ref 70–99)
Potassium: 3.6 mmol/L (ref 3.5–5.1)
Sodium: 138 mmol/L (ref 135–145)
Total Bilirubin: 0.6 mg/dL (ref 0.3–1.2)
Total Protein: 7.7 g/dL (ref 6.5–8.1)

## 2021-06-24 MED ORDER — ONDANSETRON HCL 4 MG/2ML IJ SOLN
4.0000 mg | Freq: Once | INTRAMUSCULAR | Status: AC
  Administered 2021-06-24: 4 mg via INTRAVENOUS
  Filled 2021-06-24: qty 2

## 2021-06-24 MED ORDER — OXYCODONE-ACETAMINOPHEN 5-325 MG PO TABS
1.0000 | ORAL_TABLET | Freq: Four times a day (QID) | ORAL | 0 refills | Status: DC | PRN
Start: 2021-06-24 — End: 2021-12-23

## 2021-06-24 MED ORDER — KETOROLAC TROMETHAMINE 15 MG/ML IJ SOLN
15.0000 mg | Freq: Once | INTRAMUSCULAR | Status: AC
  Administered 2021-06-24: 15 mg via INTRAVENOUS
  Filled 2021-06-24: qty 1

## 2021-06-24 MED ORDER — HYDROMORPHONE HCL 1 MG/ML IJ SOLN
1.0000 mg | Freq: Once | INTRAMUSCULAR | Status: AC
  Administered 2021-06-24: 1 mg via INTRAVENOUS
  Filled 2021-06-24: qty 1

## 2021-06-24 MED ORDER — HYDROMORPHONE HCL 1 MG/ML IJ SOLN
0.5000 mg | Freq: Once | INTRAMUSCULAR | Status: AC
  Administered 2021-06-24: 0.5 mg via INTRAVENOUS
  Filled 2021-06-24: qty 1

## 2021-06-24 MED ORDER — SODIUM CHLORIDE 0.9 % IV BOLUS: 1000.0000 mL | Freq: Once | INTRAVENOUS | Status: DC

## 2021-06-24 MED ORDER — SODIUM CHLORIDE 0.9 % IV BOLUS
1000.0000 mL | Freq: Once | INTRAVENOUS | Status: AC
  Administered 2021-06-24: 1000 mL via INTRAVENOUS

## 2021-06-24 MED ORDER — ONDANSETRON 8 MG PO TBDP: 8.0000 mg | ORAL_TABLET | Freq: Three times a day (TID) | ORAL | 0 refills | Status: DC | PRN

## 2021-06-24 NOTE — ED Notes (Signed)
Labs and urine sent to lab at this time.

## 2021-06-24 NOTE — ED Triage Notes (Addendum)
Pt arrived via POV, c/o right sided flank pain since yesterday evening. Has renal stones removed 11/11 due to recurrent infections. States pain worsening since last night. Has been taking dilaudid with little effect.  Pt aox4, slow to follow commands.

## 2021-06-24 NOTE — ED Provider Notes (Signed)
[facility] DEPT Provider Note   CSN: [id number] Arrival date & time: 06/24/21  4098     History Chief Complaint  Patient presents with   Flank Pain    Anna Wilson is a 50 y.o. female.  Patient with hx kidney stones and utis, recent urologic procedure 11/11 (ureteroscopy, with laser lithotripsy, stone extraction and stent placement, bilateral) c/o acute onset right flank pain yesterday. Symptoms acute onset at rest, mod-severe, dull, non radiating, right sided. +nausea. No vomiting or diarrhea. No constipation. No abd distension. No fever or chills. No dysuria or gross hematuria. Has tried home pain meds without relief.  Pt was supposed to pull out stent this AM, but pulled out a little early, around 3 AM today, due to pain.   The history is provided by the patient and medical records.  Flank Pain Pertinent negatives include no chest pain, no headaches and no shortness of breath.      Past Medical History:  Diagnosis Date   Anemia    Ankylosing spondylitis ([place])    rhemotology--- dr Amil Amen,  treated with humira injection   Anxiety    Carpal tunnel syndrome, bilateral    DDD (degenerative disc disease), lumbosacral    Frequency of urination    GERD (gastroesophageal reflux disease)    History of 2019 novel coronavirus disease (COVID-19) 03/24/2020   per pt had mild symptoms that resolved in 10 days and lingering cough resolved few weeks later   History of chest pain    in epic was referred to cardiology, dr Johnsie Cancel (note 01-28-2017) by pt's pcp;  previous nuclear stress test negative for ischemia ef 69% (results in care everywhere 12-22-2016) and normal echo results 08-13-2014 in care everywhere;   pt had cardiac cath @[facility]  03-17-2017 (in epic) no angiographic evidence of CAD and LVEF 65%, non-cardiac chest pain   History of esophageal spasm    per pt hx chest pain from to no be non-cardiac but esophageal spasm and takes protonix which pt stated no  spasm's taking protonix   History of kidney stones    History of peptic ulcer 2016   History of recurrent UTIs    Hyperlipidemia    Hypertension    followed by pcp   Migraines    followed by pcp   OA (osteoarthritis)    Pneumonia    Renal calculus, left    Wears glasses     Patient Active Problem List   Diagnosis Date Noted   Morning headache 05/14/2021   Positional headache 05/14/2021   Hypersomnia with sleep apnea 05/14/2021   Snoring 05/14/2021   Opiate analgesic contract exists 05/14/2021   Chronic migraine without aura without status migrainosus, not intractable 02/18/2021   Hypotension 11/30/2020   AKI (acute kidney injury) ([place]) 11/30/2020   Anxiety    Acute cystitis 11/29/2020   Ankylosing spondylitis ([place]) 05/31/2020   Attention deficit hyperactivity disorder (ADHD) 05/31/2020   Benign essential HTN 02/24/2020   Nephrolithiasis 02/24/2020   GERD (gastroesophageal reflux disease) 02/24/2020   Hyperlipidemia 02/24/2020   Pyelonephritis 02/24/2020   Precordial pain     Past Surgical History:  Procedure Laterality Date   ABDOMINOPLASTY     ANTERIOR CRUCIATE LIGAMENT (ACL) REVISION Left 03-09-2018  @[facility]    arthroscopy and quadriceps tendon graft   capel tunnel Bilateral 2022   wrist   CHOLECYSTECTOMY OPEN  1191   AND UMBILICIAL HERNIA REPAIR AND ABDOMINOPLASTY   CYSTOSCOPY W/ URETERAL STENT PLACEMENT Left 05/31/2020   Procedure: CYSTOSCOPY  WITH RETROGRADE PYELOGRAM/URETERAL STENT PLACEMENT;  Surgeon: Janith Lima, MD;  Location: [facility];  Service: Urology;  Laterality: Left;   CYSTOSCOPY/RETROGRADE/URETEROSCOPY/STONE EXTRACTION WITH BASKET Right 02/25/2020   Procedure: CYSTOSCOPY RETROGRADE/ URETEROSCOPY/STONE EXTRACTION WITH BASKET/URETERAL STENT PLACEMENT ;  Surgeon: Cleon Gustin, MD;  Location: [facility];  Service: Urology;  Laterality: Right;   CYSTOSCOPY/RETROGRADE/URETEROSCOPY/STONE EXTRACTION WITH BASKET  2012    CYSTOSCOPY/RETROGRADE/URETEROSCOPY/STONE EXTRACTION WITH BASKET Left 06/11/2020   Procedure: CYSTOSCOPY/RETROGRADE/URETEROSCOPY/ LASER LITHOTRIPSY/ STONE EXTRACTION WITH BASKET/ LEFT STENT EXCHANGE;  Surgeon: Janith Lima, MD;  Location: [facility];  Service: Urology;  Laterality: Left;  ONLY NEEDS 45 MIN   CYSTOSCOPY/URETEROSCOPY/HOLMIUM LASER/STENT PLACEMENT Bilateral 06/21/2021   Procedure: CYSTOSCOPY/RETROGRADE/URETEROSCOPY/HOLMIUM LASER/STENT PLACEMENT;  Surgeon: Janith Lima, MD;  Location: [facility];  Service: Urology;  Laterality: Bilateral;  ONLY NEEDS 60 MIN   HERNIA REPAIR     KNEE ARTHROSCOPY W/ ACL RECONSTRUCTION Left 2002   KNEE ARTHROSCOPY W/ SYNOVECTOMY Left 07-15-2018  @[facility]    lysis adhesions   KNEE SURGERY Left 2004 and 2017   revision scar tissue   LEFT HEART CATH AND CORONARY ANGIOGRAPHY N/A 03/17/2017   Procedure: LEFT HEART CATH AND CORONARY ANGIOGRAPHY;  Surgeon: Burnell Blanks, MD;  Location: [facility];  Service: Cardiovascular;  Laterality: N/A;   LUMBAR SPINE SURGERY  07-15-2019  @[facility]    L4--5 disectomy decompression;  L5--S1 laminectomy   PELVIC LAPAROSCOPY  1992   for endometriosis   POSTERIOR LUMBAR FUSION  12-20-2019  @[facility]    L4--5 revision laminectomy and fusion   SHOULDER ARTHROSCOPY Bilateral 2022   TOTAL LAPAROSCOPIC HYSTERECTOMY WITH BILATERAL SALPINGO OOPHORECTOMY  02/26/2016     OB History   No obstetric history on file.     Family History  Problem Relation Age of Onset   Hypertension Mother    Hypertension Father    Hypothyroidism Brother    Rheum arthritis Daughter    Asthma Daughter    Hypothyroidism Brother    Asthma Son     Social History   Tobacco Use   Smoking status: Former    Years: 6.00    Types: Cigarettes    Quit date: 06/07/1994    Years since quitting: 27.0   Smokeless tobacco: Never  Vaping Use   Vaping Use: Never used  Substance Use Topics   Alcohol use: Yes    Comment:  occasional   Drug use: No    Home Medications Prior to Admission medications   Medication Sig Start Date End Date Taking? Authorizing Provider  amLODipine-valsartan (EXFORGE) 10-160 MG tablet Take 1 tablet by mouth daily. 12/27/20   [provider]  cephALEXin (KEFLEX) 500 MG capsule Take 1 capsule (500 mg total) by mouth 2 (two) times daily for 6 doses. 06/21/21 06/24/21  Janith Lima, MD  Coenzyme Q10 (COQ-10) 100 MG capsule Take 100 mg by mouth daily.    [provider]  docusate sodium (COLACE) 100 MG capsule Take 100 mg by mouth daily.    [provider]  DULoxetine (CYMBALTA) 60 MG capsule Take 60 mg by mouth daily. 01/22/20   [provider]  escitalopram (LEXAPRO) 20 MG tablet Take 20 mg by mouth daily. 04/27/20   [provider]  Fremanezumab-vfrm (AJOVY) 225 MG/1.5ML SOAJ Inject 225 mg into the skin every 30 (thirty) days. 02/18/21   Melvenia Beam, MD  gabapentin (NEURONTIN) 300 MG capsule Take 600 mg by mouth 3 (three) times daily. 05/01/21   [provider]  HUMIRA PEN 40 MG/0.4ML PNKT Inject 40 mg into the skin every 14 (fourteen) days. 02/05/21   [provider]  Multiple Vitamin (MULTIVITAMIN ADULT PO) Take 1 tablet by mouth at bedtime.     [provider]  Omega-3 1000 MG CAPS Take 1,000 mg by mouth at bedtime.     [provider]  ondansetron (ZOFRAN) 4 MG tablet Take 1 tablet (4 mg total) by mouth every 8 (eight) hours as needed for nausea or vomiting (nausea/vomiting). 12/01/20   Raiford Noble Latif, DO  oxyCODONE-acetaminophen (PERCOCET) 5-325 MG tablet Take 1 tablet by mouth every 4 (four) hours as needed for up to 15 doses for severe pain. 06/21/21   Janith Lima, MD  pantoprazole (PROTONIX) 40 MG tablet Take 40 mg by mouth 2 (two) times daily.    [provider]  Potassium Citrate 15 MEQ (1620 MG) TBCR Take 1 tablet by mouth 2 (two) times daily. 06/04/21   [provider]   pravastatin (PRAVACHOL) 20 MG tablet Take 20 mg by mouth daily.    [provider]  prednisoLONE acetate (PRED FORTE) 1 % ophthalmic suspension Place 1 drop into both eyes daily as needed (irritation).    [provider]  Probiotic Product (PROBIOTIC DAILY PO) Take 1 capsule by mouth daily. 62 billion    [provider]  Red Yeast Rice Extract 600 MG CAPS Take 600 mg by mouth at bedtime.    [provider]  tiZANidine (ZANAFLEX) 4 MG capsule Take 4 mg by mouth 3 (three) times daily as needed for muscle spasms.    [provider]  topiramate (TOPAMAX) 50 MG tablet Take 50 mg by mouth 2 (two) times daily. 12/14/19   [provider]  Turmeric (QC TUMERIC COMPLEX PO) Take 1 capsule by mouth daily.    [provider]  UBRELVY 50 MG TABS Take 50 mg by mouth daily as needed (migraine). 11/27/20   [provider]    Allergies    Nsaids, Sulfa antibiotics, and Sulfasalazine  Review of Systems   Review of Systems  Constitutional:  Negative for chills and fever.  HENT:  Negative for sore throat.   Eyes:  Negative for redness.  Respiratory:  Negative for cough and shortness of breath.   Cardiovascular:  Negative for chest pain.  Gastrointestinal:  Positive for nausea. Negative for vomiting.  Genitourinary:  Positive for flank pain. Negative for dysuria.  Musculoskeletal:  Negative for neck pain.  Skin:  Negative for rash.  Neurological:  Negative for headaches.  Hematological:  Does not bruise/bleed easily.  Psychiatric/Behavioral:  Negative for confusion.    Physical Exam Updated Vital Signs BP (!) 144/96   Pulse 98   Temp 98.3 F (36.8 C) (Oral)   Resp 15   LMP  (LMP Unknown)   SpO2 99%   Physical Exam Vitals and nursing note reviewed.  Constitutional:      Appearance: Normal appearance. She is well-developed.  HENT:     Head: Atraumatic.     Nose: Nose normal.     Mouth/Throat:     Mouth: Mucous membranes are  moist.  Eyes:     General: No scleral icterus.    Conjunctiva/sclera: Conjunctivae normal.  Neck:     Trachea: No tracheal deviation.  Cardiovascular:     Rate and Rhythm: Normal rate and regular rhythm.     Pulses: Normal pulses.     Heart sounds: Normal heart sounds. No murmur heard.  No friction rub. No gallop.  Pulmonary:     Effort: Pulmonary effort is normal. No respiratory distress.     Breath sounds: Normal breath sounds.  Abdominal:     General: Bowel sounds are normal. There is no distension.     Palpations: Abdomen is soft.     Tenderness: There is no abdominal tenderness. There is no guarding.  Genitourinary:    Comments: No cva tenderness.  Musculoskeletal:        General: No swelling.     Cervical back: Normal range of motion and neck supple. No rigidity. No muscular tenderness.  Skin:    General: Skin is warm and dry.     Findings: No rash.  Neurological:     Mental Status: She is alert.     Comments: Alert, speech normal.   Psychiatric:        Mood and Affect: Mood normal.    ED Results / Procedures / Treatments   Labs (all labs ordered are listed, but only abnormal results are displayed) Results for orders placed or performed during the hospital encounter of 06/24/21  CBC with Differential  Result Value Ref Range   WBC 12.2 (H) 4.0 - 10.5 K/uL   RBC 4.17 3.87 - 5.11 MIL/uL   Hemoglobin 12.2 12.0 - 15.0 g/dL   HCT 37.3 36.0 - 46.0 %   MCV 89.4 80.0 - 100.0 fL   MCH 29.3 26.0 - 34.0 pg   MCHC 32.7 30.0 - 36.0 g/dL   RDW 12.4 11.5 - 15.5 %   Platelets 297 150 - 400 K/uL   nRBC 0.0 0.0 - 0.2 %   Neutrophils Relative % 78 %   Neutro Abs 9.6 (H) 1.7 - 7.7 K/uL   Lymphocytes Relative 12 %   Lymphs Abs 1.4 0.7 - 4.0 K/uL   Monocytes Relative 7 %   Monocytes Absolute 0.8 0.1 - 1.0 K/uL   Eosinophils Relative 2 %   Eosinophils Absolute 0.2 0.0 - 0.5 K/uL   Basophils Relative 1 %   Basophils Absolute 0.1 0.0 - 0.1 K/uL   Immature Granulocytes 0 %    Abs Immature Granulocytes 0.05 0.00 - 0.07 K/uL  Comprehensive metabolic panel  Result Value Ref Range   Sodium 138 135 - 145 mmol/L   Potassium 3.6 3.5 - 5.1 mmol/L   Chloride 104 98 - 111 mmol/L   CO2 24 22 - 32 mmol/L   Glucose, Bld 137 (H) 70 - 99 mg/dL   BUN 18 6 - 20 mg/dL   Creatinine, Ser 1.54 (H) 0.44 - 1.00 mg/dL   Calcium 9.8 8.9 - 10.3 mg/dL   Total Protein 7.7 6.5 - 8.1 g/dL   Albumin 4.4 3.5 - 5.0 g/dL   AST 19 15 - 41 U/L   ALT 9 0 - 44 U/L   Alkaline Phosphatase 62 38 - 126 U/L   Total Bilirubin 0.6 0.3 - 1.2 mg/dL   GFR, Estimated 41 (L) >60 mL/min   Anion gap 10 5 - 15  Lipase, blood  Result Value Ref Range   Lipase 30 11 - 51 U/L  Urinalysis, Routine w reflex microscopic  Result Value Ref Range   Color, Urine AMBER (A) YELLOW   APPearance CLOUDY (A) CLEAR   Specific Gravity, Urine 1.013 1.005 - 1.030   pH 8.0 5.0 - 8.0   Glucose, UA NEGATIVE NEGATIVE mg/dL   Hgb urine dipstick LARGE (A) NEGATIVE   Bilirubin Urine NEGATIVE NEGATIVE   Ketones, ur  NEGATIVE NEGATIVE mg/dL   Protein, ur 30 (A) NEGATIVE mg/dL   Nitrite POSITIVE (A) NEGATIVE   Leukocytes,Ua TRACE (A) NEGATIVE   RBC / HPF >50 (H) 0 - 5 RBC/hpf   WBC, UA >50 (H) 0 - 5 WBC/hpf   Bacteria, UA FEW (A) NONE SEEN   Squamous Epithelial / LPF 0-5 0 - 5   Mucus PRESENT    DG C-Arm 1-60 Min-No Report  Result Date: 06/21/2021 Fluoroscopy was utilized by the requesting physician.  No radiographic interpretation.   DG C-Arm 1-60 Min-No Report  Result Date: 06/21/2021 Fluoroscopy was utilized by the requesting physician.  No radiographic interpretation.     EKG None  Radiology CT Renal Stone Study  Result Date: 06/24/2021 CLINICAL DATA:  Right flank pain EXAM: CT ABDOMEN AND PELVIS WITHOUT CONTRAST TECHNIQUE: Multidetector CT imaging of the abdomen and pelvis was performed following the standard protocol without IV contrast. COMPARISON:  05/08/2021 FINDINGS: Lower chest: Included lung bases are  clear.  Heart size is normal. Hepatobiliary: Unremarkable unenhanced appearance of the liver. No focal liver lesion identified. Gallbladder is surgically absent. No biliary dilatation. Pancreas: Unremarkable. No pancreatic ductal dilatation or surrounding inflammatory changes. Spleen: Normal in size without focal abnormality. Adrenals/Urinary Tract: Unremarkable adrenal glands. Crescentic hyperdense collection along the periphery of the right kidney measuring up to 11 mm in thickness compatible with subcapsular hematoma. Multiple punctate right renal stones. Moderate right hydroureteronephrosis. No calculi are seen within the right ureter. Multiple small punctate stones within the left kidney. There is mild left hydroureteronephrosis. No left ureteral calculi. Urinary bladder is decompressed. No bladder stones are seen. Stomach/Bowel: Stomach is within normal limits. Appendix appears normal. No evidence of bowel wall thickening, distention, or inflammatory changes. Vascular/Lymphatic: Scattered aortoiliac atherosclerotic calcifications without aneurysm. No abdominopelvic lymphadenopathy. Reproductive: Status post hysterectomy. No adnexal masses. Other: No free fluid. No abdominopelvic fluid collection. No pneumoperitoneum. No abdominal wall hernia. Musculoskeletal: Prior lumbar fusion at L4-5. No new or acute osseous findings. IMPRESSION: 1. Small-moderate-sized right-sided subcapsular perirenal hematoma. 2. Multiple punctate bilateral renal calculi. Moderate right and mild left hydroureteronephrosis. No ureteral calculi are identified. No obstructing stone is seen within either ureter. Aortic Atherosclerosis (ICD10-I70.0). Electronically Signed   By: Davina Poke D.O.   On: 06/24/2021 09:19    Procedures Procedures   Medications Ordered in ED Medications  sodium chloride 0.9 % bolus 1,000 mL (has no administration in time range)  HYDROmorphone (DILAUDID) injection 1 mg (has no administration in time  range)  ondansetron (ZOFRAN) injection 4 mg (has no administration in time range)  ketorolac (TORADOL) 15 MG/ML injection 15 mg (has no administration in time range)    ED Course  I have reviewed the triage vital signs and the nursing notes.  Pertinent labs & imaging results that were available during my care of the patient were reviewed by me and considered in my medical decision making (see chart for details).    MDM Rules/Calculators/A&P                           Iv ns bolus. Dilaudid iv, zofran iv, toradol iv.   Reviewed nursing notes and prior charts for additional history.  Recent urologic procedure reviewed.   Labs reviewed/interpreted by me - wbc mildly high, 12. Ua with many rbc and wbc - will culture.   CT reviewed/interpreted by me - mild-mod right hydro, no ureteral stone. ?right perinephric fluid ?hematoma vs urine.   Pain  improved but not resolved. Dilaudid .5 mg iv. Zofran iv.   Pain much improved. Pt asking for po.   Urology/Dr Abner Greenspan consulted - Dr Lovena Neighbours responded, discussed labs and CT with subcapsular fluid/?hematoma and right hydro, recent procedure and stent removal, etc. - he indicates as no ureteral stone and vitals stable/afebrile, and pain improved - d/c to  home and f/u with them as arranged.   Pts pain is improved. Tolerating po. Pt appears stable for d/c.        Final Clinical Impression(s) / ED Diagnoses Final diagnoses:  None    Rx / DC Orders ED Discharge Orders     None        Lajean Saver, MD 06/24/21 1112

## 2021-06-24 NOTE — Telephone Encounter (Deleted)
Botox charge sheet completed and ready for MD signature. Dx code: G28.709.

## 2021-06-24 NOTE — Anesthesia Postprocedure Evaluation (Signed)
Anesthesia Post Note  Patient: Anna Wilson  Procedure(s) Performed: CYSTOSCOPY/RETROGRADE/URETEROSCOPY/HOLMIUM LASER/STENT PLACEMENT (Bilateral)     Patient location during evaluation: PACU Anesthesia Type: General Level of consciousness: awake and alert Pain management: pain level controlled Vital Signs Assessment: post-procedure vital signs reviewed and stable Respiratory status: spontaneous breathing, nonlabored ventilation, respiratory function stable and patient connected to nasal cannula oxygen Cardiovascular status: blood pressure returned to baseline and stable Postop Assessment: no apparent nausea or vomiting Anesthetic complications: no   No notable events documented.  Last Vitals:  Vitals:   06/21/21 1615 06/21/21 1645  BP: 140/87 136/84  Pulse: 80 80  Resp: 16 16  Temp:    SpO2: 95% 96%    Last Pain:  Vitals:   06/21/21 1645  TempSrc:   PainSc: 0-No pain                 Vondra Aldredge L Aldora Perman

## 2021-06-24 NOTE — Discharge Instructions (Addendum)
It was our pleasure to provide your ER care today - we hope that you feel better.  Drink plenty of fluids/stay well hydrated.   Complete the course of your antibiotic.   Take your pain meds as need. Take zofran as need for nausea.  Follow up with your urologist as planned.  Return to ER if worse, new symptoms, fevers, severe or intractable pain, persistent vomiting, or other concern.  You were given pain meds in the ER - no driving for the next 8 hours.

## 2021-06-26 DIAGNOSIS — M4326 Fusion of spine, lumbar region: Secondary | ICD-10-CM | POA: Diagnosis not present

## 2021-06-26 DIAGNOSIS — M5416 Radiculopathy, lumbar region: Secondary | ICD-10-CM | POA: Diagnosis not present

## 2021-06-27 ENCOUNTER — Ambulatory Visit: Payer: BC Managed Care – PPO | Admitting: Adult Health

## 2021-06-27 DIAGNOSIS — M450 Ankylosing spondylitis of multiple sites in spine: Secondary | ICD-10-CM | POA: Diagnosis not present

## 2021-06-27 DIAGNOSIS — M797 Fibromyalgia: Secondary | ICD-10-CM | POA: Diagnosis not present

## 2021-06-27 DIAGNOSIS — M25511 Pain in right shoulder: Secondary | ICD-10-CM | POA: Diagnosis not present

## 2021-06-27 DIAGNOSIS — M25512 Pain in left shoulder: Secondary | ICD-10-CM | POA: Diagnosis not present

## 2021-06-28 ENCOUNTER — Encounter: Payer: Self-pay | Admitting: Adult Health

## 2021-06-28 ENCOUNTER — Ambulatory Visit (INDEPENDENT_AMBULATORY_CARE_PROVIDER_SITE_OTHER): Payer: BC Managed Care – PPO | Admitting: Adult Health

## 2021-06-28 ENCOUNTER — Ambulatory Visit: Payer: BC Managed Care – PPO | Admitting: Adult Health

## 2021-06-28 VITALS — BP 110/80 | HR 85 | Ht 63.0 in | Wt 154.8 lb

## 2021-06-28 DIAGNOSIS — M542 Cervicalgia: Secondary | ICD-10-CM

## 2021-06-28 DIAGNOSIS — G43709 Chronic migraine without aura, not intractable, without status migrainosus: Secondary | ICD-10-CM | POA: Diagnosis not present

## 2021-06-28 MED ORDER — UBRELVY 100 MG PO TABS: ORAL_TABLET | ORAL | 11 refills | Status: DC

## 2021-06-28 NOTE — Progress Notes (Signed)
PATIENT: Anna Wilson DOB: 28-Aug-1970  REASON FOR VISIT: follow up HISTORY FROM: patient PRIMARY NEUROLOGIST:   HISTORY OF PRESENT ILLNESS: Today 06/28/21:  Anna Wilson is a 50 year old female with a history of migraine headaches.  She returns today for follow-up.  At the last visit she was started on Ajovy.  She continues on Topamax.  She is also on Ubrelvy for abortive therapy.  She reports that her headaches have gotten better.  She states in the last week she was put on opioids due to her recent surgery to remove kidney stones.  She states that they punctured the cyst and she developed a hematoma which is causing her pain.  She does not plan to be on opioids long-term.  She states that the opioids do increase her headache frequency.  She states that typically when she takes [place] within about 30 minutes the headache decreases but sometimes does not resolve.  She was on Imitrex in the past but it lost its effectiveness.  Patient was sent for a sleep evaluation.  She has a pending sleep study scheduled.  MRI of the brain was relatively unremarkable  The patient states that she has been seeing a spine specialist/orthopedist for her back and shoulder.  She states that she felt in March of this year and since then her neck has been hurting.  She did have a CT scan of the neck that was relatively unremarkable.  Reports that she has had pain that radiates down the left arm with numbness in the fingertips.  She has had nerve conduction studies that was relatively unremarkable she had carpal tunnel release surgery in March in the left hand.  Continues to have numbness in the hand.  Reports that her pain in the neck starts in the back of the head and radiates down the neck and typically down the left arm.  Reports decreased range of motion with the neck particularly when she turns her head to the left.    HISTORY (copied from Dr. Cathren Laine note)  Anna Wilson is a 49 y.o. female here as requested by  Elenore Paddy, NP for headache. PMHx ankylosing spondylitis, anxiety, carpal tunnel syndrome, degenerative disc disease lumbosacral, frequency of urination, history of kidney stones, recurrent UTIs, hypertension, migraines, osteoarthritis, peptic ulcer, hyperlipidemia, hypertension, osteoarthritis, renal calculus in the left, migraines, adhd, AKI, anxiety.    Migraines since the age of 26. Also half-sister. And her child who is 20 starting to have them. She started seeing a neurologist at the age of 36 until her 73s, she also [facility], and again saw a neurologist in 2016 for left arm pain in [place]. Since then just seeing primary care and using imitrex. Topamax was prescribed and the Topamax started helping but she is having issues with kidney stones. She has a spine doctor. She has brain fog, the words are not there. She also has fibromyalgia. She hs a headache "all the time", she has a daily chronic headache, her husband is here and provides much information as well, he says she snores, she wake often at night. Wakes up with morning headaches, she is extremely fatigue, she naps every day 3-4 hours long. Migraines come on the left, pulsating/pounding/throbbing, acute treatment like caffeine can help. A dark room helps, nausea and vomiting, pounding/pulsating and throbbing, sensitivity of skin, sleeping helps, can last 24 hours, moderate to severe, 12-15 moderately severe severe to severe migraine days a month for > 4 months. She has  had trigger point injections and dry needling, she has associated neck pain and follow with a spine doctor. Vision changes, vision loss.    Reviewed notes, labs and imaging from outside physicians, which showed:   I reviewed notes from requesting provider, patient was seen in the emergency room, she just had bilateral shoulder/carpal tunnel surgeries, on 327 she was walking up the stairs at her house when she stumbled, fell forward and tumbled down  the stairs, no loss of consciousness but noted increased neck and shoulder pain, she has an orthopedist, she continues to have moderate aching right-sided neck and right-sided migraine symptoms, she completed a course of steroids about a week ago and is continue to take tizanidine and Norco without improvement, when she is lying on her left side it feels like a water balloon is pressing on her neck, pain is not associated with fever, nausea or vomiting, she has not had tingling in the arms or legs.  I reviewed physical examination which was normal including head ears eyes nose throat cardiovascular pulmonary and neurologic.  I reviewed labs BMP was unremarkable, creatinine 1.01, CBC did show elevated white blood cells otherwise unremarkable.  CT of the cervical spine showed nothing pathologic, no acute displaced fracture or traumatic listhesis of the cervical spine.  She was given Toradol and Reglan diagnosed with musculoskeletal neck pain and exacerbation of her migraines.       CT head/4/22:  showed No acute intracranial abnormalities including mass lesion or mass effect, hydrocephalus, extra-axial fluid collection, midline shift, hemorrhage, or acute infarction, large ischemic events (personally reviewed images)     From a thorough review of records, medications tried that can be used in migraine management include: Tylenol, amlodipine (calcium channel blocker in the same class as verapamil close parentheses, valsartan, aspirin, Decadron injection, Benadryl injections, Cymbalta, Lexapro, gabapentin, Toradol injections, Skelaxin, metoprolol, naproxen, ondansetron tablets and injections, topiramate is contraindicated due to history of nephrolithiasis, Phenergan injections and oral, scopolamine patches, amitriptyline, Imitrex, tizanidine, she has tried Topamax and is currently on Topamax, Ubrelvy.   Tsh normal  REVIEW OF SYSTEMS: Out of a complete 14 system review of symptoms, the patient complains only  of the following symptoms, and all other reviewed systems are negative.  See HPI  ALLERGIES: Allergies  Allergen Reactions   Nsaids Other (See Comments)    Ulcers   Sulfa Antibiotics Other (See Comments)    Pt states "I bleed from my body orficese when I take sulfa drugs"   Sulfasalazine Other (See Comments)    Pt states "I bleed from my body orficese when I take sulfa drugs"    HOME MEDICATIONS: Outpatient Medications Prior to Visit  Medication Sig Dispense Refill   amLODipine-valsartan (EXFORGE) 10-160 MG tablet Take 1 tablet by mouth daily.     Coenzyme Q10 (COQ-10) 100 MG capsule Take 100 mg by mouth daily.     docusate sodium (COLACE) 100 MG capsule Take 100 mg by mouth daily.     DULoxetine (CYMBALTA) 60 MG capsule Take 60 mg by mouth daily.     escitalopram (LEXAPRO) 20 MG tablet Take 20 mg by mouth daily.     Fremanezumab-vfrm (AJOVY) 225 MG/1.5ML SOAJ Inject 225 mg into the skin every 30 (thirty) days. 1.5 mL 11   gabapentin (NEURONTIN) 300 MG capsule Take 600 mg by mouth 3 (three) times daily.     HUMIRA PEN 40 MG/0.4ML PNKT Inject 40 mg into the skin every 14 (fourteen) days.     Multiple  Vitamin (MULTIVITAMIN ADULT PO) Take 1 tablet by mouth at bedtime.      Omega-3 1000 MG CAPS Take 1,000 mg by mouth at bedtime.      ondansetron (ZOFRAN ODT) 8 MG disintegrating tablet Take 1 tablet (8 mg total) by mouth every 8 (eight) hours as needed for nausea or vomiting. 10 tablet 0   ondansetron (ZOFRAN) 4 MG tablet Take 1 tablet (4 mg total) by mouth every 8 (eight) hours as needed for nausea or vomiting (nausea/vomiting). 20 tablet 0   oxyCODONE-acetaminophen (PERCOCET/ROXICET) 5-325 MG tablet Take 1-2 tablets by mouth every 6 (six) hours as needed for severe pain. 15 tablet 0   pantoprazole (PROTONIX) 40 MG tablet Take 40 mg by mouth 2 (two) times daily.     Potassium Citrate 15 MEQ (1620 MG) TBCR Take 1 tablet by mouth 2 (two) times daily.     pravastatin (PRAVACHOL) 20 MG  tablet Take 20 mg by mouth daily.     prednisoLONE acetate (PRED FORTE) 1 % ophthalmic suspension Place 1 drop into both eyes daily as needed (irritation).     Probiotic Product (PROBIOTIC DAILY PO) Take 1 capsule by mouth daily. 62 billion     Red Yeast Rice Extract 600 MG CAPS Take 600 mg by mouth at bedtime.     tiZANidine (ZANAFLEX) 4 MG capsule Take 4 mg by mouth 3 (three) times daily as needed for muscle spasms.     topiramate (TOPAMAX) 50 MG tablet Take 50 mg by mouth 2 (two) times daily.     Turmeric (QC TUMERIC COMPLEX PO) Take 1 capsule by mouth daily.     UBRELVY 50 MG TABS Take 50 mg by mouth daily as needed (migraine).     No facility-administered medications prior to visit.    PAST MEDICAL HISTORY: Past Medical History:  Diagnosis Date   Anemia    Ankylosing spondylitis ([place])    rhemotology--- dr Amil Amen,  treated with humira injection   Anxiety    Carpal tunnel syndrome, bilateral    DDD (degenerative disc disease), lumbosacral    Frequency of urination    GERD (gastroesophageal reflux disease)    History of 2019 novel coronavirus disease (COVID-19) 03/24/2020   per pt had mild symptoms that resolved in 10 days and lingering cough resolved few weeks later   History of chest pain    in epic was referred to cardiology, dr Johnsie Cancel (note 01-28-2017) by pt's pcp;  previous nuclear stress test negative for ischemia ef 69% (results in care everywhere 12-22-2016) and normal echo results 08-13-2014 in care everywhere;   pt had cardiac cath @[facility]  03-17-2017 (in epic) no angiographic evidence of CAD and LVEF 65%, non-cardiac chest pain   History of esophageal spasm    per pt hx chest pain from to no be non-cardiac but esophageal spasm and takes protonix which pt stated no spasm's taking protonix   History of kidney stones    History of peptic ulcer 2016   History of recurrent UTIs    Hyperlipidemia    Hypertension    followed by pcp   Migraines    followed by pcp   OA  (osteoarthritis)    Pneumonia    Renal calculus, left    Wears glasses     PAST SURGICAL HISTORY: Past Surgical History:  Procedure Laterality Date   ABDOMINOPLASTY     ANTERIOR CRUCIATE LIGAMENT (ACL) REVISION Left 03-09-2018  @[facility]    arthroscopy and quadriceps tendon graft   capel tunnel  Bilateral 2022   wrist   CHOLECYSTECTOMY OPEN  7829   AND UMBILICIAL HERNIA REPAIR AND ABDOMINOPLASTY   CYSTOSCOPY W/ URETERAL STENT PLACEMENT Left 05/31/2020   Procedure: CYSTOSCOPY WITH RETROGRADE PYELOGRAM/URETERAL STENT PLACEMENT;  Surgeon: Janith Lima, MD;  Location: [facility];  Service: Urology;  Laterality: Left;   CYSTOSCOPY/RETROGRADE/URETEROSCOPY/STONE EXTRACTION WITH BASKET Right 02/25/2020   Procedure: CYSTOSCOPY RETROGRADE/ URETEROSCOPY/STONE EXTRACTION WITH BASKET/URETERAL STENT PLACEMENT ;  Surgeon: Cleon Gustin, MD;  Location: [facility];  Service: Urology;  Laterality: Right;   CYSTOSCOPY/RETROGRADE/URETEROSCOPY/STONE EXTRACTION WITH BASKET  2012   CYSTOSCOPY/RETROGRADE/URETEROSCOPY/STONE EXTRACTION WITH BASKET Left 06/11/2020   Procedure: CYSTOSCOPY/RETROGRADE/URETEROSCOPY/ LASER LITHOTRIPSY/ STONE EXTRACTION WITH BASKET/ LEFT STENT EXCHANGE;  Surgeon: Janith Lima, MD;  [facility];  Service: Urology;  Laterality: Left;  ONLY NEEDS 45 MIN   CYSTOSCOPY/URETEROSCOPY/HOLMIUM LASER/STENT PLACEMENT Bilateral 06/21/2021   Procedure: CYSTOSCOPY/RETROGRADE/URETEROSCOPY/HOLMIUM LASER/STENT PLACEMENT;  Surgeon: Janith Lima, MD;  Location: [facility];  Service: Urology;  Laterality: Bilateral;  ONLY NEEDS 60 MIN   HERNIA REPAIR     KNEE ARTHROSCOPY W/ ACL RECONSTRUCTION Left 2002   KNEE ARTHROSCOPY W/ SYNOVECTOMY Left 07-15-2018  @[facility]    lysis adhesions   KNEE SURGERY Left 2004 and 2017   revision scar tissue   LEFT HEART CATH AND CORONARY ANGIOGRAPHY N/A 03/17/2017   Procedure: LEFT HEART CATH AND CORONARY ANGIOGRAPHY;  Surgeon: Burnell Blanks, MD;   Location: [facility];  Service: Cardiovascular;  Laterality: N/A;   LUMBAR SPINE SURGERY  07-15-2019  @[facility]    L4--5 disectomy decompression;  L5--S1 laminectomy   PELVIC LAPAROSCOPY  1992   for endometriosis   POSTERIOR LUMBAR FUSION  12-20-2019  @[facility]    L4--5 revision laminectomy and fusion   SHOULDER ARTHROSCOPY Bilateral 2022   TOTAL LAPAROSCOPIC HYSTERECTOMY WITH BILATERAL SALPINGO OOPHORECTOMY  02/26/2016    FAMILY HISTORY: Family History  Problem Relation Age of Onset   Hypertension Mother    Hypertension Father    Hypothyroidism Brother    Migraines Brother    Hypothyroidism Brother    Migraines Brother    Rheum arthritis Daughter    Asthma Daughter    Asthma Son    Migraines Niece     SOCIAL HISTORY: Social History   Socioeconomic History   Marital status: Married    Spouse name: david   Number of children: 3   Years of education: Not on file   Highest education level: Associate degree: occupational, Hotel manager, or vocational program  Occupational History   Not on file  Tobacco Use   Smoking status: Former    Years: 6.00    Types: Cigarettes    Quit date: 06/07/1994    Years since quitting: 27.0   Smokeless tobacco: Never  Vaping Use   Vaping Use: Never used  Substance and Sexual Activity   Alcohol use: Yes    Comment: occasional   Drug use: No   Sexual activity: Not on file  Other Topics Concern   Not on file  Social History Narrative   Lives with husband and 1 child   Right handed   Caffeine: 1 cup of coffee a day   Social Determinants of Health   Financial Resource Strain: Not on file  Food Insecurity: Not on file  Transportation Needs: Not on file  Physical Activity: Not on file  Stress: Not on file  Social Connections: Not on file  Intimate Partner Violence: Not on file      PHYSICAL EXAM  Vitals:  06/28/21 0949  BP: 110/80  Pulse: 85  Weight: 154 lb 12.8 oz (70.2 kg)  Height: 5' 3"  (1.6 m)   Body mass index is 27.42  kg/m.  Generalized: Well developed, in no acute distress   Neurological examination  Mentation: Alert oriented to time, place, history taking. Follows all commands speech and language fluent Cranial nerve II-XII: Pupils were equal round reactive to light. Extraocular movements were full, visual field were full on confrontational test. Facial sensation and strength were normal. Uvula tongue midline.  Patient unable to turn her head a complete 90 degrees to the left shoulder.  Unable to touch her chin to her shoulder Motor: The motor testing reveals 5 over 5 strength of all 4 extremities. Good symmetric motor tone is noted throughout.  Sensory: Sensory testing is intact to soft touch on all 4 extremities. No evidence of extinction is noted.  Coordination: Cerebellar testing reveals good finger-nose-finger and heel-to-shin bilaterally.  Gait and station: Gait is normal.  Reflexes: Deep tendon reflexes are symmetric and normal bilaterally.   DIAGNOSTIC DATA (LABS, IMAGING, TESTING) - I reviewed patient records, labs, notes, testing and imaging myself where available.  Lab Results  Component Value Date   WBC 12.2 (H) 06/24/2021   HGB 12.2 06/24/2021   HCT 37.3 06/24/2021   MCV 89.4 06/24/2021   PLT 297 06/24/2021      Component Value Date/Time   NA 138 06/24/2021 0800   NA 138 03/16/2017 1109   K 3.6 06/24/2021 0800   CL 104 06/24/2021 0800   CO2 24 06/24/2021 0800   GLUCOSE 137 (H) 06/24/2021 0800   BUN 18 06/24/2021 0800   BUN 11 03/16/2017 1109   CREATININE 1.54 (H) 06/24/2021 0800   CALCIUM 9.8 06/24/2021 0800   PROT 7.7 06/24/2021 0800   ALBUMIN 4.4 06/24/2021 0800   AST 19 06/24/2021 0800   ALT 9 06/24/2021 0800   ALKPHOS 62 06/24/2021 0800   BILITOT 0.6 06/24/2021 0800   GFRNONAA 41 (L) 06/24/2021 0800   GFRAA >60 02/26/2020 0358   No results found for: CHOL, HDL, LDLCALC, LDLDIRECT, TRIG, CHOLHDL Lab Results  Component Value Date   HGBA1C 5.0 02/26/2020   No  results found for: [id number] Lab Results  Component Value Date   TSH 2.218 11/29/2020      ASSESSMENT AND PLAN 50 y.o. year old female  has a past medical history of Anemia, Ankylosing spondylitis ([place]), Anxiety, Carpal tunnel syndrome, bilateral, DDD (degenerative disc disease), lumbosacral, Frequency of urination, GERD (gastroesophageal reflux disease), History of 2019 novel coronavirus disease (COVID-19) (03/24/2020), History of chest pain, History of esophageal spasm, History of kidney stones, History of peptic ulcer (2016), History of recurrent UTIs, Hyperlipidemia, Hypertension, Migraines, OA (osteoarthritis), Pneumonia, Renal calculus, left, and Wears glasses. here with :  1.  Migraine headaches  Continue Ajovy monthly injection Increase Ubrelvy 100 mg for abortive therapy. Take 1 tab at the onset of migraine. Repeat in 2 hours if needed. Only 2 tabs/24 hours.  Decrease Topamax to 50 mg at bedtime for 2 weeks then decrease to half a tablet at bedtime for 1 week then discontinue the medication. Advised that if her headaches return we can restart Topamax. Keep scheduled sleep study  2.  Left-sided neck pain with radiating down the left arm.  Reviewed CT scan of the neck done in April 2022-unremarkable MRI of the cervical spine with and without contrast ordered      Ward Givens, MSN, NP-C 06/28/2021, 9:47 AM [facility] [address], [address] [place], [place] [address] [phone]   

## 2021-06-28 NOTE — Patient Instructions (Addendum)
Your Plan:  Continue Ajovy monthly injection Increase Ubrelvy 100 mg for abortive therapy. Take 1 tab at the onset of migraine. Repeat in 2 hours if needed. Only 2 tabs/24 hours.  Decrease Topamax to 50 mg at bedtime for 2 weeks then decrease to half a tablet at bedtime for 1 week then discontinue the medication. If headaches return can restart Topamax   Thank you for coming to see [place] at [facility]. I hope we have been able to provide you high quality care today.  You may receive a patient satisfaction survey over the next few weeks. We would appreciate your feedback and comments so that we may continue to improve ourselves and the health of our patients.

## 2021-07-01 DIAGNOSIS — F320 Major depressive disorder, single episode, mild: Secondary | ICD-10-CM | POA: Diagnosis not present

## 2021-07-01 DIAGNOSIS — M79643 Pain in unspecified hand: Secondary | ICD-10-CM | POA: Diagnosis not present

## 2021-07-01 DIAGNOSIS — S6412XD Injury of median nerve at wrist and hand level of left arm, subsequent encounter: Secondary | ICD-10-CM | POA: Diagnosis not present

## 2021-07-01 DIAGNOSIS — G5603 Carpal tunnel syndrome, bilateral upper limbs: Secondary | ICD-10-CM | POA: Diagnosis not present

## 2021-07-01 DIAGNOSIS — S6411XD Injury of median nerve at wrist and hand level of right arm, subsequent encounter: Secondary | ICD-10-CM | POA: Diagnosis not present

## 2021-07-08 DIAGNOSIS — S6412XD Injury of median nerve at wrist and hand level of left arm, subsequent encounter: Secondary | ICD-10-CM | POA: Diagnosis not present

## 2021-07-08 DIAGNOSIS — F320 Major depressive disorder, single episode, mild: Secondary | ICD-10-CM | POA: Diagnosis not present

## 2021-07-08 DIAGNOSIS — G5603 Carpal tunnel syndrome, bilateral upper limbs: Secondary | ICD-10-CM | POA: Diagnosis not present

## 2021-07-08 DIAGNOSIS — M79643 Pain in unspecified hand: Secondary | ICD-10-CM | POA: Diagnosis not present

## 2021-07-08 DIAGNOSIS — S6411XD Injury of median nerve at wrist and hand level of right arm, subsequent encounter: Secondary | ICD-10-CM | POA: Diagnosis not present

## 2021-07-09 DIAGNOSIS — Z780 Asymptomatic menopausal state: Secondary | ICD-10-CM | POA: Diagnosis not present

## 2021-07-09 DIAGNOSIS — Z6825 Body mass index (BMI) 25.0-25.9, adult: Secondary | ICD-10-CM | POA: Diagnosis not present

## 2021-07-09 DIAGNOSIS — Z0000 Encounter for general adult medical examination without abnormal findings: Secondary | ICD-10-CM | POA: Diagnosis not present

## 2021-07-09 DIAGNOSIS — E785 Hyperlipidemia, unspecified: Secondary | ICD-10-CM | POA: Diagnosis not present

## 2021-07-10 ENCOUNTER — Telehealth: Payer: Self-pay | Admitting: Adult Health

## 2021-07-10 NOTE — Telephone Encounter (Signed)
MR Cervical spine w/wo contrast [facility] Josem Kaufmann: [id number] (exp. 07/10/21 to 08/08/21). Patient is scheduled at [facility] for 07/23/21.

## 2021-07-11 DIAGNOSIS — M79645 Pain in left finger(s): Secondary | ICD-10-CM | POA: Diagnosis not present

## 2021-07-11 DIAGNOSIS — M79643 Pain in unspecified hand: Secondary | ICD-10-CM | POA: Diagnosis not present

## 2021-07-11 DIAGNOSIS — S6412XD Injury of median nerve at wrist and hand level of left arm, subsequent encounter: Secondary | ICD-10-CM | POA: Diagnosis not present

## 2021-07-11 DIAGNOSIS — M654 Radial styloid tenosynovitis [de Quervain]: Secondary | ICD-10-CM | POA: Diagnosis not present

## 2021-07-11 DIAGNOSIS — N200 Calculus of kidney: Secondary | ICD-10-CM | POA: Diagnosis not present

## 2021-07-11 DIAGNOSIS — R8271 Bacteriuria: Secondary | ICD-10-CM | POA: Diagnosis not present

## 2021-07-11 DIAGNOSIS — G5603 Carpal tunnel syndrome, bilateral upper limbs: Secondary | ICD-10-CM | POA: Diagnosis not present

## 2021-07-11 DIAGNOSIS — M24812 Other specific joint derangements of left shoulder, not elsewhere classified: Secondary | ICD-10-CM | POA: Diagnosis not present

## 2021-07-11 DIAGNOSIS — N281 Cyst of kidney, acquired: Secondary | ICD-10-CM | POA: Diagnosis not present

## 2021-07-11 DIAGNOSIS — S6411XD Injury of median nerve at wrist and hand level of right arm, subsequent encounter: Secondary | ICD-10-CM | POA: Diagnosis not present

## 2021-07-11 DIAGNOSIS — N3020 Other chronic cystitis without hematuria: Secondary | ICD-10-CM | POA: Diagnosis not present

## 2021-07-18 DIAGNOSIS — F320 Major depressive disorder, single episode, mild: Secondary | ICD-10-CM | POA: Diagnosis not present

## 2021-07-23 ENCOUNTER — Other Ambulatory Visit: Payer: Self-pay

## 2021-07-23 ENCOUNTER — Ambulatory Visit (INDEPENDENT_AMBULATORY_CARE_PROVIDER_SITE_OTHER): Payer: BC Managed Care – PPO

## 2021-07-23 DIAGNOSIS — M542 Cervicalgia: Secondary | ICD-10-CM | POA: Diagnosis not present

## 2021-07-23 DIAGNOSIS — N281 Cyst of kidney, acquired: Secondary | ICD-10-CM | POA: Diagnosis not present

## 2021-07-23 DIAGNOSIS — M450 Ankylosing spondylitis of multiple sites in spine: Secondary | ICD-10-CM | POA: Diagnosis not present

## 2021-07-23 DIAGNOSIS — N200 Calculus of kidney: Secondary | ICD-10-CM | POA: Diagnosis not present

## 2021-07-23 DIAGNOSIS — G43709 Chronic migraine without aura, not intractable, without status migrainosus: Secondary | ICD-10-CM

## 2021-07-23 MED ORDER — GADOBENATE DIMEGLUMINE 529 MG/ML IV SOLN
14.0000 mL | Freq: Once | INTRAVENOUS | Status: AC | PRN
  Administered 2021-07-23: 14 mL via INTRAVENOUS

## 2021-07-24 DIAGNOSIS — G5603 Carpal tunnel syndrome, bilateral upper limbs: Secondary | ICD-10-CM | POA: Diagnosis not present

## 2021-07-24 DIAGNOSIS — S6412XD Injury of median nerve at wrist and hand level of left arm, subsequent encounter: Secondary | ICD-10-CM | POA: Diagnosis not present

## 2021-07-24 DIAGNOSIS — M79643 Pain in unspecified hand: Secondary | ICD-10-CM | POA: Diagnosis not present

## 2021-07-24 DIAGNOSIS — S6411XD Injury of median nerve at wrist and hand level of right arm, subsequent encounter: Secondary | ICD-10-CM | POA: Diagnosis not present

## 2021-07-26 DIAGNOSIS — F320 Major depressive disorder, single episode, mild: Secondary | ICD-10-CM | POA: Diagnosis not present

## 2021-08-06 DIAGNOSIS — M5416 Radiculopathy, lumbar region: Secondary | ICD-10-CM | POA: Diagnosis not present

## 2021-08-11 ENCOUNTER — Other Ambulatory Visit: Payer: Self-pay

## 2021-08-11 ENCOUNTER — Ambulatory Visit (INDEPENDENT_AMBULATORY_CARE_PROVIDER_SITE_OTHER): Payer: BC Managed Care – PPO | Admitting: Neurology

## 2021-08-11 DIAGNOSIS — R0683 Snoring: Secondary | ICD-10-CM

## 2021-08-11 DIAGNOSIS — R51 Headache with orthostatic component, not elsewhere classified: Secondary | ICD-10-CM

## 2021-08-11 DIAGNOSIS — R519 Headache, unspecified: Secondary | ICD-10-CM

## 2021-08-11 DIAGNOSIS — G4733 Obstructive sleep apnea (adult) (pediatric): Secondary | ICD-10-CM | POA: Diagnosis not present

## 2021-08-11 DIAGNOSIS — G4710 Hypersomnia, unspecified: Secondary | ICD-10-CM

## 2021-08-11 DIAGNOSIS — Z79891 Long term (current) use of opiate analgesic: Secondary | ICD-10-CM

## 2021-08-11 HISTORY — PX: BACK SURGERY: SHX140

## 2021-08-15 DIAGNOSIS — F320 Major depressive disorder, single episode, mild: Secondary | ICD-10-CM | POA: Diagnosis not present

## 2021-08-22 DIAGNOSIS — M25512 Pain in left shoulder: Secondary | ICD-10-CM | POA: Diagnosis not present

## 2021-08-22 DIAGNOSIS — M542 Cervicalgia: Secondary | ICD-10-CM | POA: Diagnosis not present

## 2021-08-25 NOTE — Progress Notes (Signed)
There was some diaphoresis noted at midnight which interfered briefly with the EKG channel.  The arousals were noted as: 21 were spontaneous, 0 were associated with PLMs, 2 were associated with respiratory events. The patient had a total of 0 Periodic Limb Movements.  Audio and video analysis did not show any abnormal or unusual movements, behaviors, phonations or vocalizations.  Mild Snoring was noted. EKG was in keeping with normal sinus rhythm (NSR).  IMPRESSION: 1. Very mild Obstructive Sleep Apnea (OSA) which was accentuated by supine sleep position and during REM sleep- but does not require intervention. AHI was always under 10/h. No hypoxia was found.  2. The patient may sleep a little better avoiding supine sleep.  3. No Periodic Limb Movement Disorder (PLMD) was seen.  RECOMMENDATIONS: 1. I could not find a physiologic cause for sleep headaches and hypersomnia. 2.  Sleep diaphoresis was noted and may improve with a phytoestrogen or HRT - this would need to be discussed with her Gynecologist.

## 2021-08-25 NOTE — Procedures (Signed)
PATIENT'S NAMELunell, Robart DOB:      1971-07-24      MR#:    [id number]     DATE OF RECORDING: 08/11/2021 REFERRING M.D.:  Sarina Ill MD Study Performed:   Baseline Polysomnogram HISTORY:  Anna Wilson is a 51 year- old female patient of Dr Cathren Laine who was seen in consultation on 05/14/2021. Chief concern : Patient here with husband for evaluation of snoring, fatigue and morning headaches. Migraines will wake her from sleep, cluster type HA, top of the head, sharp, nauseating,  and she has woken up having headaches, dull, and sinus pressure related. She has mild nausea in AM too.  Sleep relevant: Nocturia once a night, Sleep talking, punching in her sleep,      I have the pleasure of seeing Anna Wilson today, a right-handed White or Caucasian female with a possible sleep disorder. She has a past medical history of Ankylosing spondylitis ([place]), Anxiety, Carpal tunnel syndrome, bilateral, DDD (degenerative disc disease), lumbosacral, Frequency of urination, History of 2019 novel coronavirus disease (COVID-19) (03/24/2020), History of chest pain, History of esophageal spasm, History of kidney stones, History of peptic ulcer (2016), History of recurrent UTIs, Hyperlipidemia, Hypertension, Migraines, OA (osteoarthritis), Renal calculus, left, and Wears glasses.     Social history:  Patient is disabled, worked as a Probation officer and lives in a household with spouse, youngest daughter of 3 kids, and with 2 dogs.  The patient 's daughter is doing most of the shopping and household.  Tobacco use quit 1995.  ETOH use: less than 3 glasses a month  Caffeine intake in form of Coffee (1 cup in AM ) Soda( when eating out ). Regular exercise in form of PT.  The sleep specific findings are morning and positional headaches, hypersomnia, snoring. opiate analgesic use.   The patient endorsed the Epworth Sleepiness Scale at 13/24 points.   The patient's weight 151 pounds with a height of 64 (inches), resulting in  a BMI of 25.6 kg/m2. The patient's neck circumference measured 14 inches.  CURRENT MEDICATIONS: Exforge, COQ-10, Colace, Cymbalta, Lexapro, Ajovy, Neurontin, Multivitamin, Macrobid, Omega-3, Zofran, Percocet, Protonix, Pravachol, Probiotic, Zanaflex, Topamax, Turmeric, Red Yeast Rice Extract, Ubrelvy   PROCEDURE:  This is a multichannel digital polysomnogram utilizing the Somnostar 11.2 system.  Electrodes and sensors were applied and monitored per AASM Specifications.   EEG, EOG, Chin and Limb EMG, were sampled at 200 Hz.  ECG, Snore and Nasal Pressure, Thermal Airflow, Respiratory Effort, CPAP Flow and Pressure, Oximetry was sampled at 50 Hz. Digital video and audio were recorded.          BASELINE STUDY: Lights Out was at 21:55 and Lights On at 05:09.  Total recording time (TRT) was 435 minutes, with a total sleep time (TST) of 384 minutes.   The patient's sleep latency was 35 minutes.  REM latency was 340.5 minutes.  The sleep efficiency was 88.3 %.     SLEEP ARCHITECTURE: WASO (Wake after sleep onset) was 17 minutes.  There were 16 minutes in Stage N1, 241.5 minutes Stage N2, 73 minutes Stage N3 and 53.5 minutes in Stage REM.  The percentage of Stage N1 was 4.2%, Stage N2 was 62.9%, Stage N3 was 19.% and Stage R (REM sleep) was 13.9%.   RESPIRATORY ANALYSIS:  There were a total of 37 respiratory events:  37 hypopneas with a hypopnea index of 5.8 /hour.      The total APNEA/HYPOPNEA INDEX (AHI) was 5.8/hour.  7 events occurred in  REM sleep and 60 events in NREM. The REM AHI was 7.9 /hour, versus a non-REM AHI of 5.4. The patient spent 60.5 minutes of total sleep time in the supine position and 324 minutes in non-supine. The supine AHI was 9.9 versus a non-supine AHI of 5.0.  OXYGEN SATURATION & C02:  The Wake baseline 02 saturation was 96%, with the lowest being 87%. Time spent below 89% saturation equaled 3 minutes.   There was some diaphoresis noted at midnight which interfered briefly  with the EKG channel.  The arousals were noted as: 21 were spontaneous, 0 were associated with PLMs, 2 were associated with respiratory events. The patient had a total of 0 Periodic Limb Movements.  Audio and video analysis did not show any abnormal or unusual movements, behaviors, phonations or vocalizations.  Mild Snoring was noted. EKG was in keeping with normal sinus rhythm (NSR).  IMPRESSION: Very mild Obstructive Sleep Apnea (OSA) which was accentuated by supine sleep position and during REM sleep- but does not require intervention. AHI was always under 10/h. No hypoxia was found.  The patient may sleep a little better avoiding supine sleep.  No Periodic Limb Movement Disorder (PLMD) was seen.  RECOMMENDATIONS: I could not find a physiologic cause for sleep headaches and hypersomnia.  Sleep diaphoresis was noted and may improve with a phytoestrogen or HRT - this would need to be discussed with her Gynecologist.    I certify that I have reviewed the entire raw data recording prior to the issuance of this report in accordance with the Standards of Accreditation of the [place] [employer] (AASM)  Larey Seat, MD Medical Director, [facility], [place] of Neurology and Sleep Medicine (Neurology and Sleep Medicine)

## 2021-08-26 ENCOUNTER — Encounter: Payer: Self-pay | Admitting: Adult Health

## 2021-08-26 DIAGNOSIS — F320 Major depressive disorder, single episode, mild: Secondary | ICD-10-CM | POA: Diagnosis not present

## 2021-08-26 NOTE — Telephone Encounter (Signed)
Received a fax from [employer] asking for Dr Cathren Laine comment on the independent medical review they completed. Per Dr Jaynee Eagles, no comment. She has only seen the patient once. Response faxed back to [facility]. [phone]. Received a receipt of confirmation.

## 2021-08-28 ENCOUNTER — Telehealth: Payer: Self-pay

## 2021-08-28 DIAGNOSIS — M4326 Fusion of spine, lumbar region: Secondary | ICD-10-CM | POA: Diagnosis not present

## 2021-08-28 DIAGNOSIS — M5416 Radiculopathy, lumbar region: Secondary | ICD-10-CM | POA: Diagnosis not present

## 2021-08-28 NOTE — Telephone Encounter (Signed)
-----   Message from Ward Givens, NP sent at 08/28/2021  9:13 AM EST ----- Please let the patient know that her sleep study did not show any significant apnea.  She did have diaphoresis while sleeping this may improve with hormone replacement therapy-can discuss with her OB/GYN if she desires

## 2021-08-28 NOTE — Telephone Encounter (Signed)
I called the pt and we discussed results of sleep study. She verbalized understanding and is agreeable to f/u with OB/GYN. 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 Jaynee Eagles to review and discuss.

## 2021-08-29 DIAGNOSIS — M5013 Cervical disc disorder with radiculopathy, cervicothoracic region: Secondary | ICD-10-CM | POA: Diagnosis not present

## 2021-09-13 DIAGNOSIS — F320 Major depressive disorder, single episode, mild: Secondary | ICD-10-CM | POA: Diagnosis not present

## 2021-09-24 DIAGNOSIS — I10 Essential (primary) hypertension: Secondary | ICD-10-CM | POA: Diagnosis not present

## 2021-09-24 DIAGNOSIS — N959 Unspecified menopausal and perimenopausal disorder: Secondary | ICD-10-CM | POA: Diagnosis not present

## 2021-09-24 DIAGNOSIS — G43909 Migraine, unspecified, not intractable, without status migrainosus: Secondary | ICD-10-CM | POA: Diagnosis not present

## 2021-09-24 DIAGNOSIS — Z87898 Personal history of other specified conditions: Secondary | ICD-10-CM | POA: Diagnosis not present

## 2021-09-27 DIAGNOSIS — F320 Major depressive disorder, single episode, mild: Secondary | ICD-10-CM | POA: Diagnosis not present

## 2021-10-10 ENCOUNTER — Other Ambulatory Visit: Payer: Self-pay | Admitting: Orthopaedic Surgery

## 2021-10-10 DIAGNOSIS — M25552 Pain in left hip: Secondary | ICD-10-CM

## 2021-10-10 DIAGNOSIS — M4326 Fusion of spine, lumbar region: Secondary | ICD-10-CM | POA: Diagnosis not present

## 2021-10-10 DIAGNOSIS — M5416 Radiculopathy, lumbar region: Secondary | ICD-10-CM | POA: Diagnosis not present

## 2021-10-11 DIAGNOSIS — F320 Major depressive disorder, single episode, mild: Secondary | ICD-10-CM | POA: Diagnosis not present

## 2021-10-21 ENCOUNTER — Other Ambulatory Visit: Payer: Self-pay

## 2021-10-21 ENCOUNTER — Emergency Department (HOSPITAL_COMMUNITY): Payer: BC Managed Care – PPO

## 2021-10-21 ENCOUNTER — Encounter (HOSPITAL_COMMUNITY): Payer: Self-pay

## 2021-10-21 ENCOUNTER — Emergency Department (HOSPITAL_COMMUNITY)
Admission: EM | Admit: 2021-10-21 | Discharge: 2021-10-21 | Disposition: A | Discharge status: Home / Self Care | Payer: BC Managed Care – PPO | Attending: Emergency Medicine | Admitting: Emergency Medicine

## 2021-10-21 DIAGNOSIS — R079 Chest pain, unspecified: Secondary | ICD-10-CM | POA: Insufficient documentation

## 2021-10-21 DIAGNOSIS — R0602 Shortness of breath: Secondary | ICD-10-CM | POA: Diagnosis not present

## 2021-10-21 DIAGNOSIS — I10 Essential (primary) hypertension: Secondary | ICD-10-CM | POA: Diagnosis not present

## 2021-10-21 DIAGNOSIS — R109 Unspecified abdominal pain: Secondary | ICD-10-CM | POA: Diagnosis not present

## 2021-10-21 DIAGNOSIS — R0789 Other chest pain: Secondary | ICD-10-CM | POA: Diagnosis not present

## 2021-10-21 DIAGNOSIS — K760 Fatty (change of) liver, not elsewhere classified: Secondary | ICD-10-CM | POA: Diagnosis not present

## 2021-10-21 DIAGNOSIS — Z79899 Other long term (current) drug therapy: Secondary | ICD-10-CM | POA: Insufficient documentation

## 2021-10-21 DIAGNOSIS — R7989 Other specified abnormal findings of blood chemistry: Secondary | ICD-10-CM | POA: Diagnosis not present

## 2021-10-21 DIAGNOSIS — N1330 Unspecified hydronephrosis: Secondary | ICD-10-CM | POA: Diagnosis not present

## 2021-10-21 DIAGNOSIS — R600 Localized edema: Secondary | ICD-10-CM | POA: Diagnosis not present

## 2021-10-21 DIAGNOSIS — R609 Edema, unspecified: Secondary | ICD-10-CM

## 2021-10-21 HISTORY — DX: Brachial plexus disorders: G54.0

## 2021-10-21 LAB — BASIC METABOLIC PANEL
Anion gap: 8 (ref 5–15)
BUN: 13 mg/dL (ref 6–20)
CO2: 28 mmol/L (ref 22–32)
Calcium: 9.4 mg/dL (ref 8.9–10.3)
Chloride: 104 mmol/L (ref 98–111)
Creatinine, Ser: 0.86 mg/dL (ref 0.44–1.00)
GFR, Estimated: >60 mL/min (ref >60)
Glucose, Bld: 99 mg/dL (ref 70–99)
Potassium: 4 mmol/L (ref 3.5–5.1)
Sodium: 140 mmol/L (ref 135–145)

## 2021-10-21 LAB — BRAIN NATRIURETIC PEPTIDE: B Natriuretic Peptide: 18 pg/mL (ref 0.0–100.0)

## 2021-10-21 LAB — D-DIMER, QUANTITATIVE: D-Dimer, Quant: 0.64 ug/mL-FEU — ABNORMAL HIGH (ref 0.00–0.50)

## 2021-10-21 LAB — CBC
HCT: 40 % (ref 36.0–46.0)
Hemoglobin: 12.7 g/dL (ref 12.0–15.0)
MCH: 26.8 pg (ref 26.0–34.0)
MCHC: 31.8 g/dL (ref 30.0–36.0)
MCV: 84.4 fL (ref 80.0–100.0)
Platelets: 353 10*3/uL (ref 150–400)
RBC: 4.74 MIL/uL (ref 3.87–5.11)
RDW: 13.2 % (ref 11.5–15.5)
WBC: 6.5 10*3/uL (ref 4.0–10.5)
nRBC: 0 % (ref 0.0–0.2)

## 2021-10-21 LAB — TROPONIN I (HIGH SENSITIVITY): Troponin I (High Sensitivity): 2 ng/L (ref <18)

## 2021-10-21 MED ORDER — IOHEXOL 350 MG/ML SOLN
100.0000 mL | Freq: Once | INTRAVENOUS | Status: AC | PRN
  Administered 2021-10-21: 100 mL via INTRAVENOUS

## 2021-10-21 MED ORDER — FUROSEMIDE 20 MG PO TABS: 20.0000 mg | ORAL_TABLET | Freq: Every day | ORAL | 0 refills | Status: DC

## 2021-10-21 NOTE — ED Provider Triage Note (Signed)
Emergency Medicine Provider Triage Evaluation Note ? ?Marveen Reeks , a 51 y.o. female  was evaluated in triage.  Pt complains of chest pain, bilateral leg swelling, shortness of breath.  Patient was recently on a cruise got back last night.  Reports increased swelling to bilateral legs, has done some elevation which has improved this.  No prior history of heart failure.  Did have a cardiac cath couple of years ago which was within normal limits, she was diagnosed with esophageal spasms.  Had multiple episodes on the cruise where she felt like there was tightness to her chest, difficult for her to swallow.  No CAD, no family history of CAD, no prior history of blood clots. ? ?Review of Systems  ?Positive: Leg swelling, chest pain, SOB ?Negative: Headache, fever, weakness.  ? ?Physical Exam  ?BP (!) 144/98 (BP Location: Left Arm)   Pulse 86   Temp 98.4 ?F (36.9 ?C) (Oral)   Resp 16   Ht 5' 4"  (1.626 m)   Wt 72.6 kg   LMP  (LMP Unknown)   SpO2 99%   BMI 27.46 kg/m?  ?Gen:   Awake, no distress   ?Resp:  Normal effort  ?MSK:   Moves extremities without difficulty  ?Other:  No edema noted on exam, TPP along BL calves. ? ?Medical Decision Making  ?Medically screening exam initiated at 5:23 PM.  Appropriate orders placed.  Tonimarie Wilbourne was informed that the remainder of the evaluation will be completed by another provider, this initial triage assessment does not replace that evaluation, and the importance of remaining in the ED until their evaluation is complete. ? ?Patient with BL leg swelling. (Please see photo on her iphone), no prior hx of CHF, or CAD.  ?  Janeece Fitting, PA-C ?10/21/21 1725 ? ?

## 2021-10-21 NOTE — ED Provider Notes (Signed)
[place] DEPT Provider Note   CSN: [id number] Arrival date & time: 10/21/21  1637     History  Chief Complaint  Patient presents with   Leg Swelling    Anna Wilson is a 51 y.o. female.  HPI Patient presents with swelling in her legs and chest pain.  Recently went on a cruise.  7-day cruise.  Drove to it.  Had increasing swelling on the legs there.  States she had keep them elevated.  No fevers.  Has not had history of swelling before.  States she is been keeping her legs elevated and the swelling is gone down.  Did have recent change of her amlodipine and her blood pressure medicine.  Had gone up on that.  No fevers.  Occasionally will have some left-sided abdominal pain.  Previous hysterectomy.  Also had a capsular hematoma a few months ago and is due to be seen by urology and states she was post to get a CT scan and ultrasound to reevaluate.  However also has had chest pain at times.  Has a history of esophageal spasm and states this felt like it but also had some shortness of breath.  States she had some acid in her throat.    Home Medications Prior to Admission medications   Medication Sig Start Date End Date Taking? Authorizing Provider  furosemide (LASIX) 20 MG tablet Take 1 tablet (20 mg total) by mouth daily. 10/21/21  Yes Davonna Belling, MD  amLODipine-valsartan (EXFORGE) 10-160 MG tablet Take 1 tablet by mouth daily. 12/27/20   [provider]  Coenzyme Q10 (COQ-10) 100 MG capsule Take 100 mg by mouth daily.    [provider]  docusate sodium (COLACE) 100 MG capsule Take 100 mg by mouth daily.    [provider]  DULoxetine (CYMBALTA) 60 MG capsule Take 60 mg by mouth daily. 01/22/20   [provider]  escitalopram (LEXAPRO) 20 MG tablet Take 20 mg by mouth daily. 04/27/20   [provider]  Fremanezumab-vfrm (AJOVY) 225 MG/1.5ML SOAJ Inject 225 mg into the skin every 30 (thirty) days. 02/18/21    Melvenia Beam, MD  gabapentin (NEURONTIN) 300 MG capsule Take 600 mg by mouth 3 (three) times daily. 05/01/21   [provider]  HUMIRA PEN 40 MG/0.4ML PNKT Inject 40 mg into the skin every 14 (fourteen) days. 02/05/21   [provider]  Multiple Vitamin (MULTIVITAMIN ADULT PO) Take 1 tablet by mouth at bedtime.     [provider]  Omega-3 1000 MG CAPS Take 1,000 mg by mouth at bedtime.     [provider]  ondansetron (ZOFRAN ODT) 8 MG disintegrating tablet Take 1 tablet (8 mg total) by mouth every 8 (eight) hours as needed for nausea or vomiting. 06/24/21   Lajean Saver, MD  ondansetron (ZOFRAN) 4 MG tablet Take 1 tablet (4 mg total) by mouth every 8 (eight) hours as needed for nausea or vomiting (nausea/vomiting). 12/01/20   Raiford Noble Latif, DO  oxyCODONE-acetaminophen (PERCOCET/ROXICET) 5-325 MG tablet Take 1-2 tablets by mouth every 6 (six) hours as needed for severe pain. 06/24/21   Lajean Saver, MD  pantoprazole (PROTONIX) 40 MG tablet Take 40 mg by mouth 2 (two) times daily.    [provider]  Potassium Citrate 15 MEQ (1620 MG) TBCR Take 1 tablet by mouth 2 (two) times daily. 06/04/21   [provider]  pravastatin (PRAVACHOL) 20 MG tablet Take 20 mg by mouth daily.  [provider]  prednisoLONE acetate (PRED FORTE) 1 % ophthalmic suspension Place 1 drop into both eyes daily as needed (irritation).    [provider]  Probiotic Product (PROBIOTIC DAILY PO) Take 1 capsule by mouth daily. 62 billion    [provider]  Red Yeast Rice Extract 600 MG CAPS Take 600 mg by mouth at bedtime.    [provider]  tiZANidine (ZANAFLEX) 4 MG capsule Take 4 mg by mouth 3 (three) times daily as needed for muscle spasms.    [provider]  topiramate (TOPAMAX) 50 MG tablet Take 50 mg by mouth 2 (two) times daily. 12/14/19   [provider]  Turmeric (QC TUMERIC COMPLEX PO) Take 1 capsule by  mouth daily.    [provider]  Ubrogepant (UBRELVY) 100 MG TABS Take 1 tablet at the onset of migraine. Repeat in 2 hours if needed. Only 2 tabs/24 hours. 06/28/21   Ward Givens, NP  UNABLE TO FIND OTC Cranberry Supplement 1523m daily    [provider]      Allergies    Nsaids, Sulfa antibiotics, and Sulfasalazine    Review of Systems   Review of Systems  Constitutional:  Negative for appetite change.  HENT:  Negative for congestion.   Respiratory:  Negative for shortness of breath.   Cardiovascular:  Positive for leg swelling.  Gastrointestinal:  Positive for abdominal pain.  Musculoskeletal:  Negative for back pain.  Neurological:  Negative for weakness.  Psychiatric/Behavioral:  Negative for confusion.    Physical Exam Updated Vital Signs BP 128/90    Pulse 75    Temp 97.8 F (36.6 C) (Oral)    Resp 19    Ht 5' 4"  (1.626 m)    Wt 72.6 kg    LMP  (LMP Unknown)    SpO2 99%    BMI 27.46 kg/m  Physical Exam Vitals and nursing note reviewed.  HENT:     Head: Normocephalic.  Cardiovascular:     Rate and Rhythm: Regular rhythm.  Pulmonary:     Breath sounds: No stridor.  Abdominal:     Tenderness: There is no abdominal tenderness.  Musculoskeletal:     Cervical back: Neck supple.     Right lower leg: Edema present.     Left lower leg: Edema present.     Comments: Mild edema bilateral lower extremities.  Skin:    Capillary Refill: Capillary refill takes less than 2 seconds.  Neurological:     Mental Status: She is alert and oriented to person, place, and time.    ED Results / Procedures / Treatments   Labs (all labs ordered are listed, but only abnormal results are displayed) Labs Reviewed  D-DIMER, QUANTITATIVE - Abnormal; Notable for the following components:      Result Value   D-Dimer, Quant 0.64 (*)    All other components within normal limits  BASIC METABOLIC PANEL  CBC  BRAIN NATRIURETIC PEPTIDE  TROPONIN I (HIGH SENSITIVITY)     EKG EKG Interpretation  Date/Time:  Monday October 21 2021 17:47:26 EDT Ventricular Rate:  78 PR Interval:  164 QRS Duration: 87 QT Interval:  388 QTC Calculation: 442 R Axis:   55 Text Interpretation: Sinus rhythm Probable left atrial enlargement Confirmed by PDavonna [phone] on 10/21/2021 8:02:52 PM  Radiology CT Angio Chest PE W and/or Wo Contrast  Result Date: 10/21/2021 CLINICAL DATA:  Positive D-dimer. Concern for pulmonary embolism. Retroperitoneal hematoma follow-up. EXAM: CT ANGIOGRAPHY CHEST CT  ABDOMEN AND PELVIS WITH CONTRAST TECHNIQUE: Multidetector CT imaging of the chest was performed using the standard protocol during bolus administration of intravenous contrast. Multiplanar CT image reconstructions and MIPs were obtained to evaluate the vascular anatomy. Multidetector CT imaging of the abdomen and pelvis was performed using the standard protocol during bolus administration of intravenous contrast. RADIATION DOSE REDUCTION: This exam was performed according to the departmental dose-optimization program which includes automated exposure control, adjustment of the mA and/or kV according to patient size and/or use of iterative reconstruction technique. CONTRAST:  15m OMNIPAQUE IOHEXOL 350 MG/ML SOLN COMPARISON:  Chest radiograph dated 10/21/2021 and CT abdomen pelvis dated 06/24/2021. FINDINGS: CTA CHEST FINDINGS Cardiovascular: There is no cardiomegaly or pericardial effusion. The thoracic aorta is unremarkable. The origins of the great vessels of the aortic arch appear patent. No pulmonary artery embolus identified. Mediastinum/Nodes: There is no hilar or mediastinal adenopathy. The esophagus and the thyroid gland are grossly unremarkable. No mediastinal fluid collection. Lungs/Pleura: No focal consolidation, pleural effusion, or pneumothorax. The central airways are patent. Musculoskeletal: No acute osseous pathology. Review of the MIP images confirms the above findings.  CT ABDOMEN and PELVIS FINDINGS No intra-abdominal free air or free fluid. Hepatobiliary: Mild fatty liver. No intrahepatic biliary dilatation. Cholecystectomy. Pancreas: Unremarkable. No pancreatic ductal dilatation or surrounding inflammatory changes. Spleen: Normal in size without focal abnormality. Adrenals/Urinary Tract: The adrenal glands are unremarkable. There is mild right hydronephrosis. Faint high attenuating content in the right renal collecting system may represent excreted contrast. A urothelial lesion is not excluded. There is a 2 cm right renal inferior pole calculus. Subcentimeter bilateral renal hypodense lesions are too small to characterize. Punctate nonobstructing left renal inferior pole calculi noted. There is no hydronephrosis on the left. The visualized ureters and urinary bladder appear unremarkable. Stomach/Bowel: There is a moderate stool throughout the colon. There is no bowel obstruction or active inflammation. The appendix is normal. Vascular/Lymphatic: The abdominal aorta and IVC are unremarkable. No portal venous gas. There is no adenopathy Reproductive: Hysterectomy.  No adnexal masses. Other: Midline vertical anterior pelvic wall incisional scar. Musculoskeletal: L4-L5 posterior fusion. No acute osseous pathology. Review of the MIP images confirms the above findings. IMPRESSION: 1. No acute intrathoracic pathology. No CT evidence of pulmonary artery embolism. 2. Mild right hydronephrosis. 3. Excreted contrast in the right renal collecting system. Underlying urothelial lesion is not entirely excluded. 4. No bowel obstruction. Normal appendix. 5. Mild fatty liver. Electronically Signed   By: AAnner CreteM.D.   On: 10/21/2021 20:50   CT ABDOMEN PELVIS W CONTRAST  Result Date: 10/21/2021 CLINICAL DATA:  Positive D-dimer. Concern for pulmonary embolism. Retroperitoneal hematoma follow-up. EXAM: CT ANGIOGRAPHY CHEST CT ABDOMEN AND PELVIS WITH CONTRAST TECHNIQUE: Multidetector CT  imaging of the chest was performed using the standard protocol during bolus administration of intravenous contrast. Multiplanar CT image reconstructions and MIPs were obtained to evaluate the vascular anatomy. Multidetector CT imaging of the abdomen and pelvis was performed using the standard protocol during bolus administration of intravenous contrast. RADIATION DOSE REDUCTION: This exam was performed according to the departmental dose-optimization program which includes automated exposure control, adjustment of the mA and/or kV according to patient size and/or use of iterative reconstruction technique. CONTRAST:  10106mOMNIPAQUE IOHEXOL 350 MG/ML SOLN COMPARISON:  Chest radiograph dated 10/21/2021 and CT abdomen pelvis dated 06/24/2021. FINDINGS: CTA CHEST FINDINGS Cardiovascular: There is no cardiomegaly or pericardial effusion. The thoracic aorta is unremarkable. The origins of the great vessels of the aortic arch appear  patent. No pulmonary artery embolus identified. Mediastinum/Nodes: There is no hilar or mediastinal adenopathy. The esophagus and the thyroid gland are grossly unremarkable. No mediastinal fluid collection. Lungs/Pleura: No focal consolidation, pleural effusion, or pneumothorax. The central airways are patent. Musculoskeletal: No acute osseous pathology. Review of the MIP images confirms the above findings. CT ABDOMEN and PELVIS FINDINGS No intra-abdominal free air or free fluid. Hepatobiliary: Mild fatty liver. No intrahepatic biliary dilatation. Cholecystectomy. Pancreas: Unremarkable. No pancreatic ductal dilatation or surrounding inflammatory changes. Spleen: Normal in size without focal abnormality. Adrenals/Urinary Tract: The adrenal glands are unremarkable. There is mild right hydronephrosis. Faint high attenuating content in the right renal collecting system may represent excreted contrast. A urothelial lesion is not excluded. There is a 2 cm right renal inferior pole calculus.  Subcentimeter bilateral renal hypodense lesions are too small to characterize. Punctate nonobstructing left renal inferior pole calculi noted. There is no hydronephrosis on the left. The visualized ureters and urinary bladder appear unremarkable. Stomach/Bowel: There is a moderate stool throughout the colon. There is no bowel obstruction or active inflammation. The appendix is normal. Vascular/Lymphatic: The abdominal aorta and IVC are unremarkable. No portal venous gas. There is no adenopathy Reproductive: Hysterectomy.  No adnexal masses. Other: Midline vertical anterior pelvic wall incisional scar. Musculoskeletal: L4-L5 posterior fusion. No acute osseous pathology. Review of the MIP images confirms the above findings. IMPRESSION: 1. No acute intrathoracic pathology. No CT evidence of pulmonary artery embolism. 2. Mild right hydronephrosis. 3. Excreted contrast in the right renal collecting system. Underlying urothelial lesion is not entirely excluded. 4. No bowel obstruction. Normal appendix. 5. Mild fatty liver. Electronically Signed   By: Anner Crete M.D.   On: 10/21/2021 20:50   DG Chest Port 1 View  Result Date: 10/21/2021 CLINICAL DATA:  Chest pain EXAM: PORTABLE CHEST 1 VIEW COMPARISON:  Chest x-ray dated November 29, 2020 FINDINGS: The heart size and mediastinal contours are within normal limits. Both lungs are clear. The visualized skeletal structures are unremarkable. IMPRESSION: No active disease. Electronically Signed   By: Yetta Glassman M.D.   On: 10/21/2021 17:42    Procedures Procedures    Medications Ordered in ED Medications  iohexol (OMNIPAQUE) 350 MG/ML injection 100 mL (100 mLs Intravenous Contrast Given 10/21/21 2028)    ED Course/ Medical Decision Making/ A&P                           Medical Decision Making Amount and/or Complexity of Data Reviewed Radiology: ordered.  Risk Prescription drug management.   Patient presents with leg swelling.  Presents after  recent cruise and travel.  Had been symmetric but is actually decreasing.  Also had some chest pain shortness of breath.  Does have a history of esophageal spasm but with leg swelling there is worry of a blood clot.  D-dimer done and elevated.  Chest x-ray reassuring.  No active disease.  Independently interpreted by me.  However D-dimer was elevated.  CT scan done and shows no blood clot.  Did have some small abdominal pain also.  Has had recent renal subcapsular hematoma.  Discussed with urology.  Has follow-up for ultrasound or CT scan.  Will get CT scan here to evaluate.  May not need the ultrasound.  Will discharge home.  CT scan reassuring with no hemorrhage at this time.  With the edema potentially could be from increasing blood pressure medicine as an outpatient.  Will give short course of Lasix  to help get some of the fluid off but will follow-up with PCP for adjustment of her blood pressure medicine.        Final Clinical Impression(s) / ED Diagnoses Final diagnoses:  Peripheral edema  Nonspecific chest pain    Rx / DC Orders ED Discharge Orders          Ordered    furosemide (LASIX) 20 MG tablet  Daily        10/21/21 2134              Davonna Belling, MD 10/21/21 2327

## 2021-10-21 NOTE — ED Triage Notes (Signed)
Patient reports that she began having bilateral leg swelling that began while on her cruise x 7 days. Patient states she also reports that she had chest pain x 3 episodes during the 7 day period. Patient states she last had chest pain 2 days ago. Patient also reports that she had SOB during the chest pain episodes. ?

## 2021-10-22 DIAGNOSIS — N200 Calculus of kidney: Secondary | ICD-10-CM | POA: Diagnosis not present

## 2021-10-22 DIAGNOSIS — N281 Cyst of kidney, acquired: Secondary | ICD-10-CM | POA: Diagnosis not present

## 2021-10-22 DIAGNOSIS — N3020 Other chronic cystitis without hematuria: Secondary | ICD-10-CM | POA: Diagnosis not present

## 2021-10-24 ENCOUNTER — Ambulatory Visit
Admission: RE | Admit: 2021-10-24 | Discharge: 2021-10-24 | Disposition: A | Discharge status: Home / Self Care | Payer: BC Managed Care – PPO | Source: Ambulatory Visit | Attending: Orthopaedic Surgery | Admitting: Orthopaedic Surgery

## 2021-10-24 ENCOUNTER — Other Ambulatory Visit: Payer: Self-pay

## 2021-10-24 DIAGNOSIS — M25552 Pain in left hip: Secondary | ICD-10-CM | POA: Diagnosis not present

## 2021-10-25 DIAGNOSIS — F320 Major depressive disorder, single episode, mild: Secondary | ICD-10-CM | POA: Diagnosis not present

## 2021-11-08 DIAGNOSIS — F320 Major depressive disorder, single episode, mild: Secondary | ICD-10-CM | POA: Diagnosis not present

## 2021-11-22 DIAGNOSIS — F320 Major depressive disorder, single episode, mild: Secondary | ICD-10-CM | POA: Diagnosis not present

## 2021-11-27 DIAGNOSIS — M25552 Pain in left hip: Secondary | ICD-10-CM | POA: Diagnosis not present

## 2021-11-27 DIAGNOSIS — M4326 Fusion of spine, lumbar region: Secondary | ICD-10-CM | POA: Diagnosis not present

## 2021-11-27 DIAGNOSIS — M5416 Radiculopathy, lumbar region: Secondary | ICD-10-CM | POA: Diagnosis not present

## 2021-11-27 DIAGNOSIS — Z6824 Body mass index (BMI) 24.0-24.9, adult: Secondary | ICD-10-CM | POA: Diagnosis not present

## 2021-11-28 DIAGNOSIS — M5416 Radiculopathy, lumbar region: Secondary | ICD-10-CM | POA: Diagnosis not present

## 2021-11-28 DIAGNOSIS — G8929 Other chronic pain: Secondary | ICD-10-CM | POA: Diagnosis not present

## 2021-11-28 DIAGNOSIS — I10 Essential (primary) hypertension: Secondary | ICD-10-CM | POA: Diagnosis not present

## 2021-11-28 DIAGNOSIS — M459 Ankylosing spondylitis of unspecified sites in spine: Secondary | ICD-10-CM | POA: Diagnosis not present

## 2021-12-06 DIAGNOSIS — F320 Major depressive disorder, single episode, mild: Secondary | ICD-10-CM | POA: Diagnosis not present

## 2021-12-10 DIAGNOSIS — Z1231 Encounter for screening mammogram for malignant neoplasm of breast: Secondary | ICD-10-CM | POA: Diagnosis not present

## 2021-12-13 DIAGNOSIS — M48062 Spinal stenosis, lumbar region with neurogenic claudication: Secondary | ICD-10-CM | POA: Diagnosis not present

## 2021-12-13 DIAGNOSIS — M5106 Intervertebral disc disorders with myelopathy, lumbar region: Secondary | ICD-10-CM | POA: Diagnosis not present

## 2021-12-13 DIAGNOSIS — M961 Postlaminectomy syndrome, not elsewhere classified: Secondary | ICD-10-CM | POA: Diagnosis not present

## 2021-12-13 DIAGNOSIS — M5416 Radiculopathy, lumbar region: Secondary | ICD-10-CM | POA: Diagnosis not present

## 2021-12-17 DIAGNOSIS — M797 Fibromyalgia: Secondary | ICD-10-CM | POA: Diagnosis not present

## 2021-12-17 DIAGNOSIS — M25511 Pain in right shoulder: Secondary | ICD-10-CM | POA: Diagnosis not present

## 2021-12-17 DIAGNOSIS — M450 Ankylosing spondylitis of multiple sites in spine: Secondary | ICD-10-CM | POA: Diagnosis not present

## 2021-12-17 DIAGNOSIS — M25512 Pain in left shoulder: Secondary | ICD-10-CM | POA: Diagnosis not present

## 2021-12-19 DIAGNOSIS — F909 Attention-deficit hyperactivity disorder, unspecified type: Secondary | ICD-10-CM | POA: Diagnosis not present

## 2021-12-19 DIAGNOSIS — Z6826 Body mass index (BMI) 26.0-26.9, adult: Secondary | ICD-10-CM | POA: Diagnosis not present

## 2021-12-19 DIAGNOSIS — I10 Essential (primary) hypertension: Secondary | ICD-10-CM | POA: Diagnosis not present

## 2021-12-23 ENCOUNTER — Ambulatory Visit (INDEPENDENT_AMBULATORY_CARE_PROVIDER_SITE_OTHER): Payer: BC Managed Care – PPO | Admitting: Adult Health

## 2021-12-23 VITALS — BP 114/76 | HR 67 | Ht 64.0 in | Wt 159.4 lb

## 2021-12-23 DIAGNOSIS — G43709 Chronic migraine without aura, not intractable, without status migrainosus: Secondary | ICD-10-CM | POA: Diagnosis not present

## 2021-12-23 NOTE — Patient Instructions (Signed)
Your Plan: ? ?Continue Ajovy and ubrelvy  ?If a formal eval is needed for TOS then have Dr. Berenice Primas send over referral with notes so we can see what testing has been done. ?If your symptoms worsen or you develop new symptoms please let us know.  ? ? ?Thank you for coming to see [place] at [facility]. I hope we have been able to provide you high quality care today. ? ?You may receive a patient satisfaction survey over the next few weeks. We would appreciate your feedback and comments so that we may continue to improve ourselves and the health of our patients. ? ?

## 2021-12-23 NOTE — Progress Notes (Signed)
? ? ?PATIENT: Anna Wilson ?DOB: 1971/07/12 ? ?REASON FOR VISIT: follow up ?HISTORY FROM: patient ?PRIMARY NEUROLOGIST:  ? ?Chief Complaint  ?Patient presents with  ? Follow-up  ?  Rm 4, alone. Migraines. Some better, (less frequent, severity same,  2 migraines/ wk).  Is going to have Transforaminal lumbar interbody fusion tomorrow by Dr. Patrice Paradise.   ? ? ?HISTORY OF PRESENT ILLNESS: ?Today 12/23/21: ? ?Anna Wilson is a 51 year old female with a history of migraine headaches.  She returns today for follow-up.She thinks that headaches are better. Having about 2-3 headaches a week. Sometimes Roselyn Meier resolves the headache other times it knocks the edge off.  ? ?Having transforminal lumbar interbody fusion tomorrow- due to sciatica, pain in hip, numbness in feet. Has had injections with no long term benefit.  ? ?Reports that her orthopedist Dr. Berenice Primas thinks that she may have thoracic outlet syndrome.  She has a letter that states that she should follow-up with her neurologist.  However a referral with notes and testing has not been sent to our office to review.  Unfortunately we cannot see the notes in epic.  Patient states that her symptoms are not bothersome to her right now. ? ?Had a sleep study that did not show sleep apnea ? ?June 28, 2021 Anna Wilson is a 51 year old female with a history of migraine headaches.  She returns today for follow-up.  At the last visit she was started on Ajovy.  She continues on Topamax.  She is also on Ubrelvy for abortive therapy.  She reports that her headaches have gotten better.  She states in the last week she was put on opioids due to her recent surgery to remove kidney stones.  She states that they punctured the cyst and she developed a hematoma which is causing her pain.  She does not plan to be on opioids long-term.  She states that the opioids do increase her headache frequency.  She states that typically when she takes [place] within about 30 minutes the headache decreases but  sometimes does not resolve.  She was on Imitrex in the past but it lost its effectiveness. ? ?Patient was sent for a sleep evaluation.  She has a pending sleep study scheduled.  MRI of the brain was relatively unremarkable ? ?The patient states that she has been seeing a spine specialist/orthopedist for her back and shoulder.  She states that she felt in March of this year and since then her neck has been hurting.  She did have a CT scan of the neck that was relatively unremarkable.  Reports that she has had pain that radiates down the left arm with numbness in the fingertips.  She has had nerve conduction studies that was relatively unremarkable she had carpal tunnel release surgery in March in the left hand.  Continues to have numbness in the hand.  Reports that her pain in the neck starts in the back of the head and radiates down the neck and typically down the left arm.  Reports decreased range of motion with the neck particularly when she turns her head to the left. ? ? ? ?HISTORY (copied from Dr. Cathren Laine note) ? Anna Wilson is a 51 y.o. female here as requested by Elenore Paddy, NP for headache. PMHx ankylosing spondylitis, anxiety, carpal tunnel syndrome, degenerative disc disease lumbosacral, frequency of urination, history of kidney stones, recurrent UTIs, hypertension, migraines, osteoarthritis, peptic ulcer, hyperlipidemia, hypertension, osteoarthritis, renal calculus in the left, migraines, adhd, AKI, anxiety.  ?  ?  Migraines since the age of 58. Also half-sister. And her child who is 20 starting to have them. She started seeing a neurologist at the age of 62 until her 1s, she also [facility], and again saw a neurologist in 2016 for left arm pain in [place]. Since then just seeing primary care and using imitrex. Topamax was prescribed and the Topamax started helping but she is having issues with kidney stones. She has a spine doctor. She has brain fog, the words are not  there. She also has fibromyalgia. She hs a headache "all the time", she has a daily chronic headache, her husband is here and provides much information as well, he says she snores, she wake often at night. Wakes up with morning headaches, she is extremely fatigue, she naps every day 3-4 hours long. Migraines come on the left, pulsating/pounding/throbbing, acute treatment like caffeine can help. A dark room helps, nausea and vomiting, pounding/pulsating and throbbing, sensitivity of skin, sleeping helps, can last 24 hours, moderate to severe, 12-15 moderately severe severe to severe migraine days a month for > 4 months. She has had trigger point injections and dry needling, she has associated neck pain and follow with a spine doctor. Vision changes, vision loss.  ?  ?Reviewed notes, labs and imaging from outside physicians, which showed: ?  ?I reviewed notes from requesting provider, patient was seen in the emergency room, she just had bilateral shoulder/carpal tunnel surgeries, on 327 she was walking up the stairs at her house when she stumbled, fell forward and tumbled down the stairs, no loss of consciousness but noted increased neck and shoulder pain, she has an orthopedist, she continues to have moderate aching right-sided neck and right-sided migraine symptoms, she completed a course of steroids about a week ago and is continue to take tizanidine and Norco without improvement, when she is lying on her left side it feels like a water balloon is pressing on her neck, pain is not associated with fever, nausea or vomiting, she has not had tingling in the arms or legs.  I reviewed physical examination which was normal including head ears eyes nose throat cardiovascular pulmonary and neurologic.  I reviewed labs BMP was unremarkable, creatinine 1.01, CBC did show elevated white blood cells otherwise unremarkable.  CT of the cervical spine showed nothing pathologic, no acute displaced fracture or traumatic listhesis  of the cervical spine.  She was given Toradol and Reglan diagnosed with musculoskeletal neck pain and exacerbation of her migraines. ?  ?  ?  ?CT head/4/22:  showed No acute intracranial abnormalities including mass lesion or mass effect, hydrocephalus, extra-axial fluid collection, midline shift, hemorrhage, or acute infarction, large ischemic events (personally reviewed images) ?  ?  ?From a thorough review of records, medications tried that can be used in migraine management include: Tylenol, amlodipine (calcium channel blocker in the same class as verapamil close parentheses, valsartan, aspirin, Decadron injection, Benadryl injections, Cymbalta, Lexapro, gabapentin, Toradol injections, Skelaxin, metoprolol, naproxen, ondansetron tablets and injections, topiramate is contraindicated due to history of nephrolithiasis, Phenergan injections and oral, scopolamine patches, amitriptyline, Imitrex, tizanidine, she has tried Topamax and is currently on Topamax, Ubrelvy. ?  ?Tsh normal ? ?REVIEW OF SYSTEMS: Out of a complete 14 system review of symptoms, the patient complains only of the following symptoms, and all other reviewed systems are negative. ? ?See HPI ? ?ALLERGIES: ?Allergies  ?Allergen Reactions  ? Nsaids Other (See Comments)  ?  Ulcers  ? Sulfa Antibiotics  Other (See Comments)  ?  Pt states "I bleed from my body orficese when I take sulfa drugs"  ? Sulfasalazine Other (See Comments)  ?  Pt states "I bleed from my body orficese when I take sulfa drugs"  ? ? ?HOME MEDICATIONS: ?Outpatient Medications Prior to Visit  ?Medication Sig Dispense Refill  ? amLODipine-valsartan (EXFORGE) 10-160 MG tablet Take 1 tablet by mouth daily.    ? Coenzyme Q10 (COQ-10) 100 MG capsule Take 100 mg by mouth daily.    ? docusate sodium (COLACE) 100 MG capsule Take 100 mg by mouth daily.    ? DULoxetine (CYMBALTA) 60 MG capsule Take 60 mg by mouth daily.    ? escitalopram (LEXAPRO) 20 MG tablet Take 20 mg by mouth daily.    ?  estradiol (ESTRACE) 0.5 MG tablet Take 0.5 mg by mouth daily.    ? Fremanezumab-vfrm (AJOVY) 225 MG/1.5ML SOAJ Inject 225 mg into the skin every 30 (thirty) days. 1.5 mL 11  ? gabapentin (NEURONTIN) 300 MG capsule T

## 2021-12-24 DIAGNOSIS — M48062 Spinal stenosis, lumbar region with neurogenic claudication: Secondary | ICD-10-CM | POA: Diagnosis not present

## 2021-12-24 DIAGNOSIS — M5116 Intervertebral disc disorders with radiculopathy, lumbar region: Secondary | ICD-10-CM | POA: Diagnosis not present

## 2021-12-24 DIAGNOSIS — M5416 Radiculopathy, lumbar region: Secondary | ICD-10-CM | POA: Diagnosis not present

## 2021-12-24 DIAGNOSIS — M961 Postlaminectomy syndrome, not elsewhere classified: Secondary | ICD-10-CM | POA: Diagnosis not present

## 2021-12-24 DIAGNOSIS — M5106 Intervertebral disc disorders with myelopathy, lumbar region: Secondary | ICD-10-CM | POA: Diagnosis not present

## 2021-12-24 DIAGNOSIS — Z981 Arthrodesis status: Secondary | ICD-10-CM | POA: Diagnosis not present

## 2021-12-24 DIAGNOSIS — M545 Low back pain, unspecified: Secondary | ICD-10-CM | POA: Diagnosis not present

## 2021-12-25 DIAGNOSIS — M545 Low back pain, unspecified: Secondary | ICD-10-CM | POA: Diagnosis not present

## 2021-12-25 DIAGNOSIS — M48062 Spinal stenosis, lumbar region with neurogenic claudication: Secondary | ICD-10-CM | POA: Diagnosis not present

## 2021-12-25 DIAGNOSIS — M961 Postlaminectomy syndrome, not elsewhere classified: Secondary | ICD-10-CM | POA: Diagnosis not present

## 2021-12-25 DIAGNOSIS — Z981 Arthrodesis status: Secondary | ICD-10-CM | POA: Diagnosis not present

## 2021-12-25 DIAGNOSIS — M5116 Intervertebral disc disorders with radiculopathy, lumbar region: Secondary | ICD-10-CM | POA: Diagnosis not present

## 2021-12-26 DIAGNOSIS — M545 Low back pain, unspecified: Secondary | ICD-10-CM | POA: Diagnosis not present

## 2021-12-26 DIAGNOSIS — Z981 Arthrodesis status: Secondary | ICD-10-CM | POA: Diagnosis not present

## 2021-12-26 DIAGNOSIS — M961 Postlaminectomy syndrome, not elsewhere classified: Secondary | ICD-10-CM | POA: Diagnosis not present

## 2021-12-26 DIAGNOSIS — M5116 Intervertebral disc disorders with radiculopathy, lumbar region: Secondary | ICD-10-CM | POA: Diagnosis not present

## 2021-12-26 DIAGNOSIS — M48062 Spinal stenosis, lumbar region with neurogenic claudication: Secondary | ICD-10-CM | POA: Diagnosis not present

## 2021-12-30 ENCOUNTER — Ambulatory Visit: Payer: BC Managed Care – PPO | Admitting: Adult Health

## 2022-01-03 DIAGNOSIS — F320 Major depressive disorder, single episode, mild: Secondary | ICD-10-CM | POA: Diagnosis not present

## 2022-01-09 DIAGNOSIS — F320 Major depressive disorder, single episode, mild: Secondary | ICD-10-CM | POA: Diagnosis not present

## 2022-01-10 DIAGNOSIS — F320 Major depressive disorder, single episode, mild: Secondary | ICD-10-CM | POA: Diagnosis not present

## 2022-01-17 DIAGNOSIS — F320 Major depressive disorder, single episode, mild: Secondary | ICD-10-CM | POA: Diagnosis not present

## 2022-01-23 DIAGNOSIS — F320 Major depressive disorder, single episode, mild: Secondary | ICD-10-CM | POA: Diagnosis not present

## 2022-01-23 DIAGNOSIS — M4326 Fusion of spine, lumbar region: Secondary | ICD-10-CM | POA: Diagnosis not present

## 2022-01-23 DIAGNOSIS — M961 Postlaminectomy syndrome, not elsewhere classified: Secondary | ICD-10-CM | POA: Diagnosis not present

## 2022-01-23 DIAGNOSIS — M5416 Radiculopathy, lumbar region: Secondary | ICD-10-CM | POA: Diagnosis not present

## 2022-01-23 DIAGNOSIS — Z981 Arthrodesis status: Secondary | ICD-10-CM | POA: Diagnosis not present

## 2022-01-23 DIAGNOSIS — M48062 Spinal stenosis, lumbar region with neurogenic claudication: Secondary | ICD-10-CM | POA: Diagnosis not present

## 2022-01-28 DIAGNOSIS — F320 Major depressive disorder, single episode, mild: Secondary | ICD-10-CM | POA: Diagnosis not present

## 2022-02-10 DIAGNOSIS — F320 Major depressive disorder, single episode, mild: Secondary | ICD-10-CM | POA: Diagnosis not present

## 2022-02-17 DIAGNOSIS — N281 Cyst of kidney, acquired: Secondary | ICD-10-CM | POA: Diagnosis not present

## 2022-02-17 DIAGNOSIS — N200 Calculus of kidney: Secondary | ICD-10-CM | POA: Diagnosis not present

## 2022-02-18 DIAGNOSIS — F320 Major depressive disorder, single episode, mild: Secondary | ICD-10-CM | POA: Diagnosis not present

## 2022-02-19 ENCOUNTER — Other Ambulatory Visit: Payer: Self-pay | Admitting: Neurology

## 2022-02-19 DIAGNOSIS — G43709 Chronic migraine without aura, not intractable, without status migrainosus: Secondary | ICD-10-CM

## 2022-02-24 DIAGNOSIS — N281 Cyst of kidney, acquired: Secondary | ICD-10-CM | POA: Diagnosis not present

## 2022-02-24 DIAGNOSIS — N200 Calculus of kidney: Secondary | ICD-10-CM | POA: Diagnosis not present

## 2022-03-04 DIAGNOSIS — F320 Major depressive disorder, single episode, mild: Secondary | ICD-10-CM | POA: Diagnosis not present

## 2022-03-18 DIAGNOSIS — F320 Major depressive disorder, single episode, mild: Secondary | ICD-10-CM | POA: Diagnosis not present

## 2022-03-19 DIAGNOSIS — M4326 Fusion of spine, lumbar region: Secondary | ICD-10-CM | POA: Diagnosis not present

## 2022-04-01 DIAGNOSIS — F320 Major depressive disorder, single episode, mild: Secondary | ICD-10-CM | POA: Diagnosis not present

## 2022-04-04 DIAGNOSIS — M25511 Pain in right shoulder: Secondary | ICD-10-CM | POA: Diagnosis not present

## 2022-04-04 DIAGNOSIS — R21 Rash and other nonspecific skin eruption: Secondary | ICD-10-CM | POA: Diagnosis not present

## 2022-04-04 DIAGNOSIS — M450 Ankylosing spondylitis of multiple sites in spine: Secondary | ICD-10-CM | POA: Diagnosis not present

## 2022-04-04 DIAGNOSIS — M797 Fibromyalgia: Secondary | ICD-10-CM | POA: Diagnosis not present

## 2022-04-04 DIAGNOSIS — M25512 Pain in left shoulder: Secondary | ICD-10-CM | POA: Diagnosis not present

## 2022-04-14 IMAGING — MR MR HIP*L* W/O CM
4 of 5 series · 31 of 40 positions shown · non-contrast
Comparison: Radiographs dated [DATE]

CLINICAL DATA: Left hip pain since fall down the stairs in [REDACTED].

EXAM:
MR OF THE LEFT HIP WITHOUT CONTRAST
TECHNIQUE: Multiplanar, multisequence MR imaging was performed. No intravenous
contrast was administered.

[Series 3: T1 · coronal · 4.0mm · 1.19mm/px · 9 of 24 slices shown]
[im 1/24]
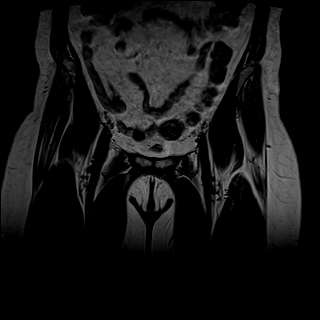
[im 3/24]
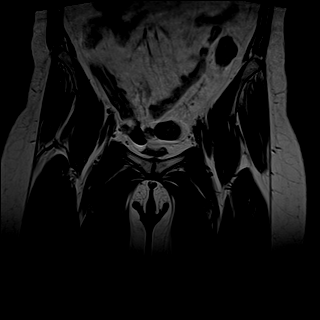
[im 6/24]
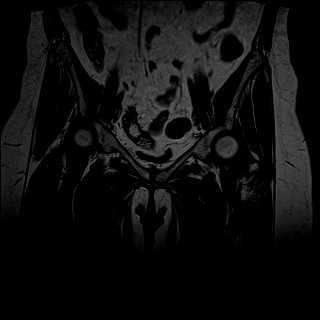
[im 9/24]
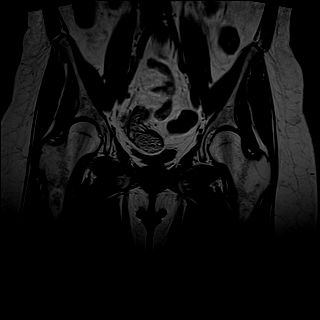
[im 12/24]
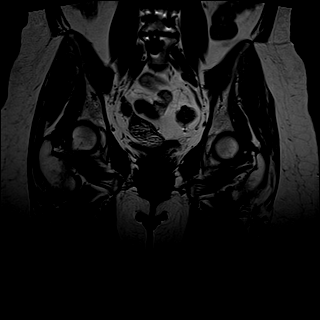
[im 15/24]
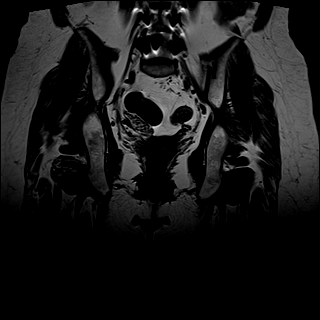
[im 18/24]
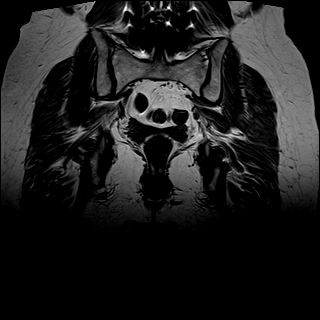
[im 21/24]
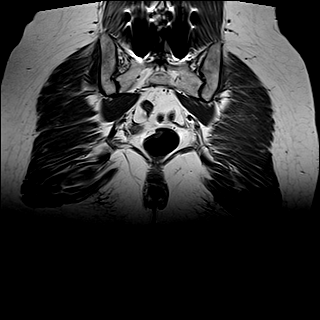
[im 24/24]
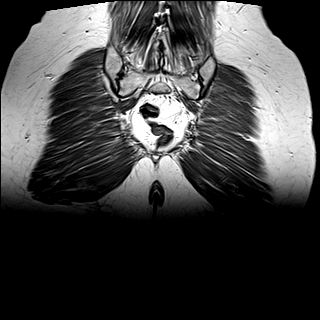

[Series 4: T2 fat-sat · coronal · 4.0mm · 1.19mm/px · 8 of 24 slices shown (1 of 2)]
[im 1/24]
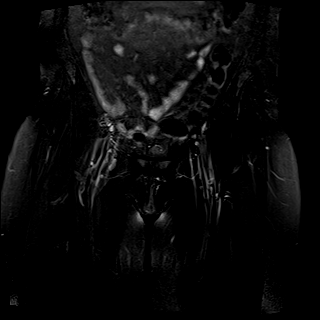
[im 4/24]
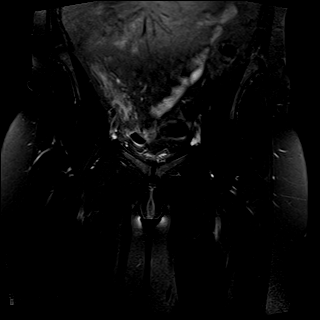
[im 7/24]
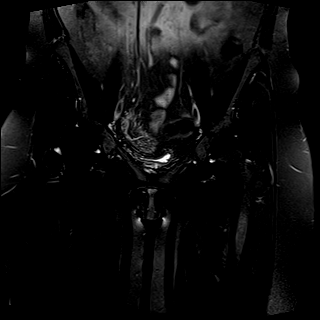
[im 10/24]
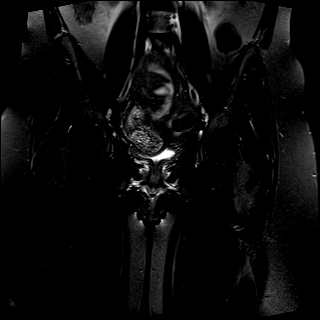
[im 14/24]
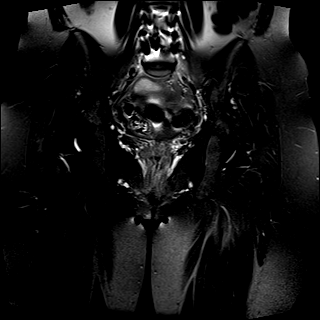
[im 17/24]
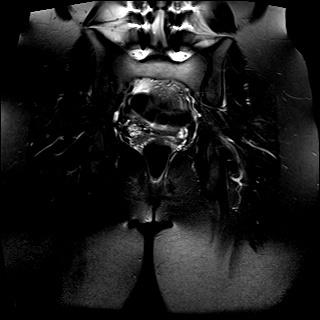
[im 20/24]
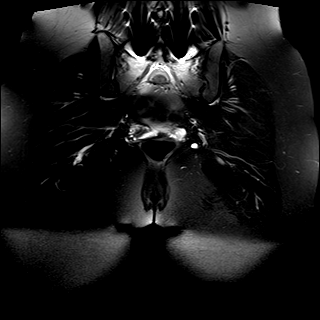
[im 24/24]
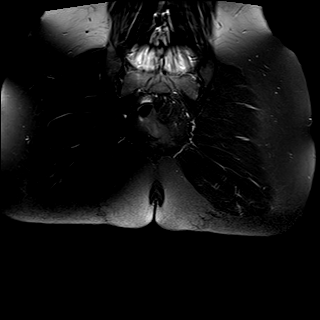

[Series 5: T2 fat-sat · axial · 4.0mm · 0.62mm/px · z∈[-110,+10]mm · 9 of 26 slices shown (2 of 2)]
[im 1/26]
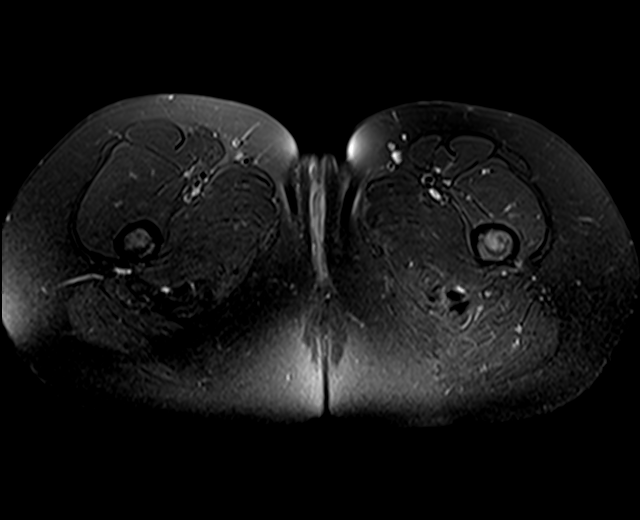
[im 4/26]
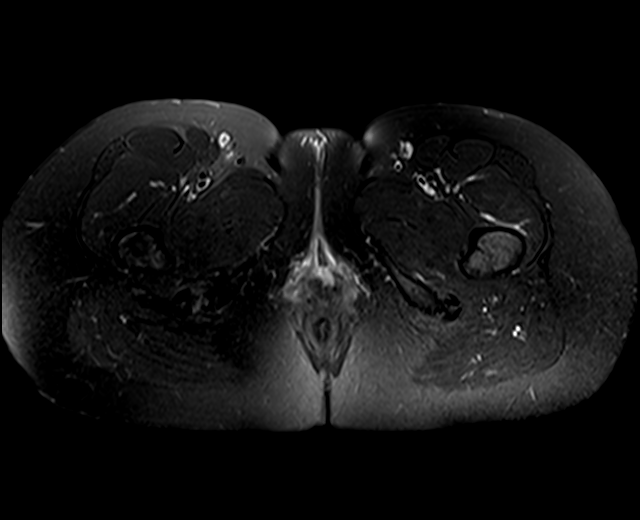
[im 7/26]
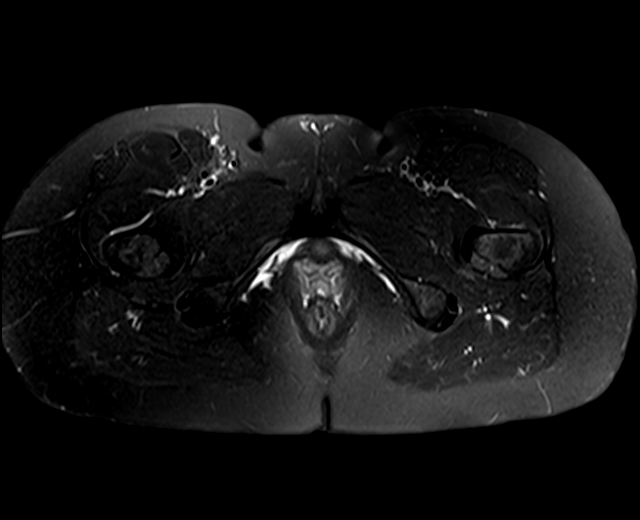
[im 10/26]
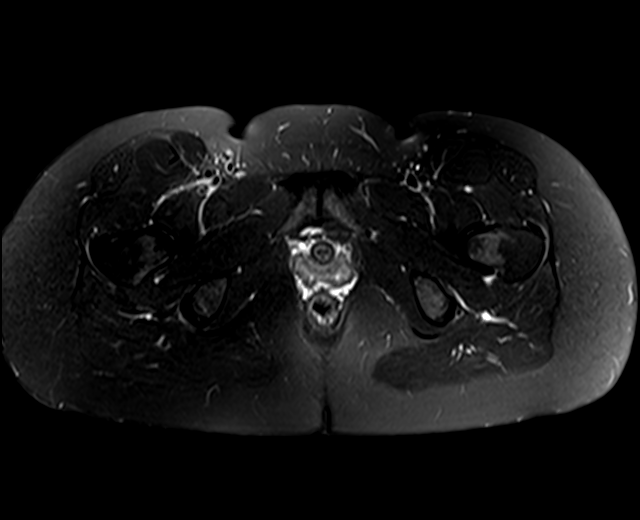
[im 13/26]
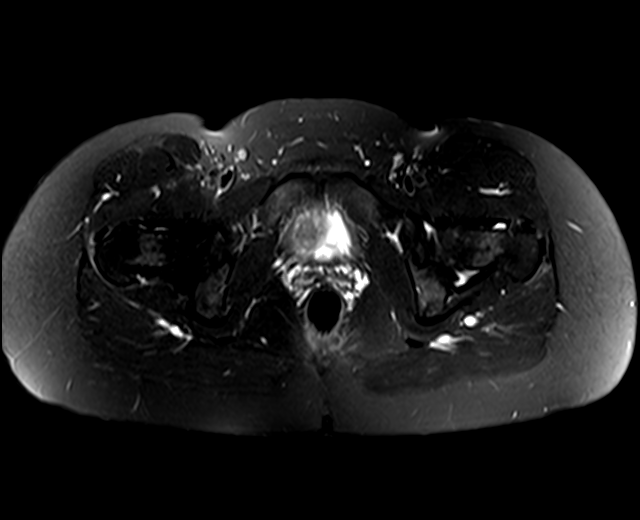
[im 16/26]
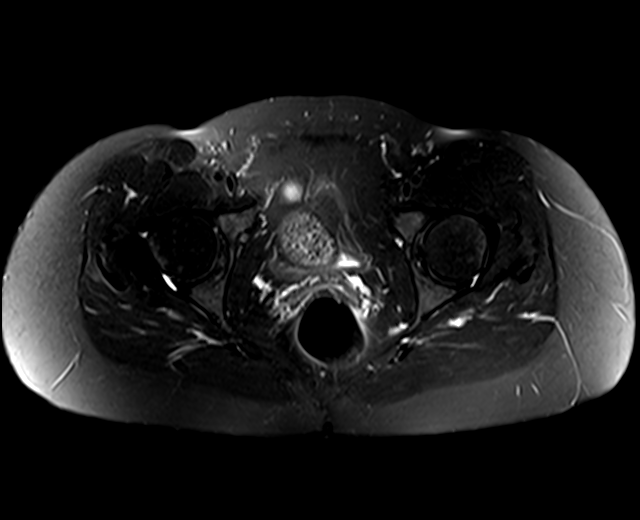
[im 19/26]
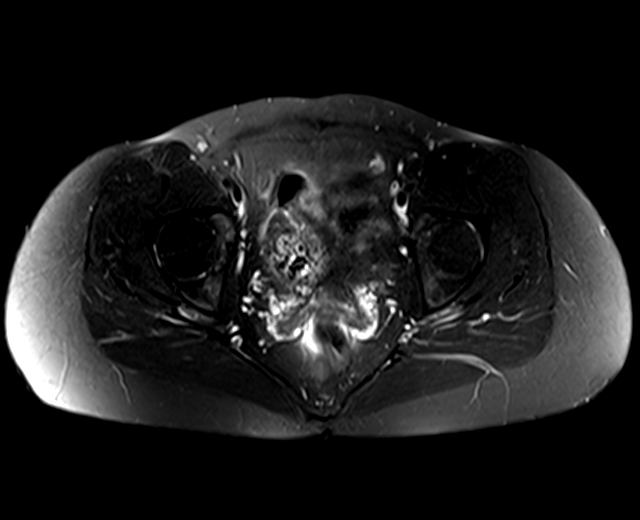
[im 22/26]
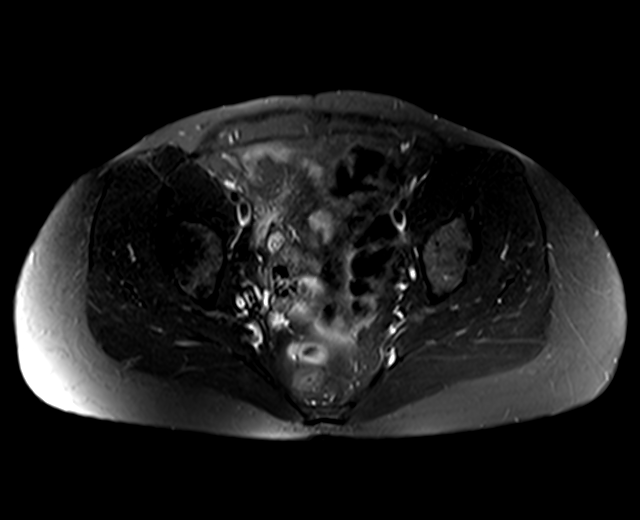
[im 26/26]
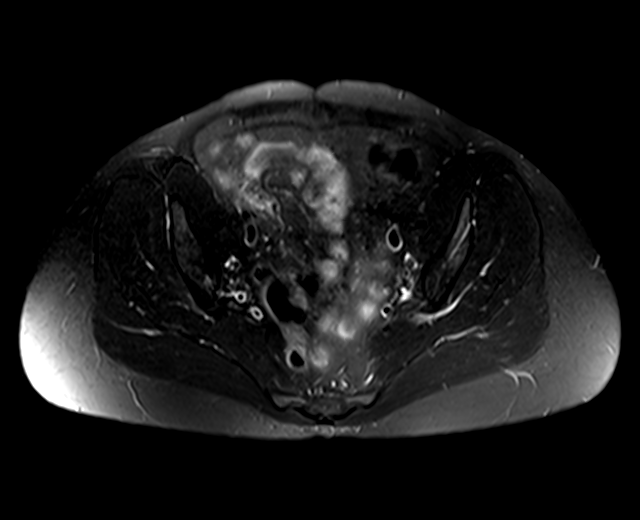

[Series 6: PD fat-sat · sagittal · 4.0mm · 0.70mm/px · 5 of 25 slices shown]
[im 1/25]
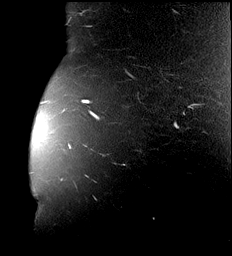
[im 4/25]
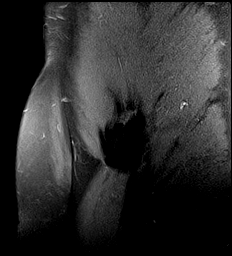
[im 7/25]
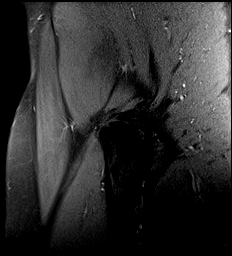
[im 14/25]
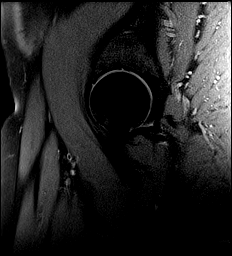
[im 21/25]
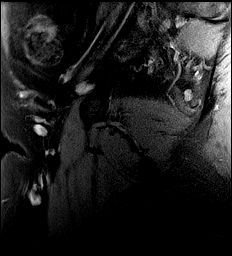

[31 of 40 positions shown; findings below may reference images not displayed]

FINDINGS: Bone

No hip fracture, dislocation or avascular necrosis. No aggressive
osseous lesion.

SI joints are normal. No SI joint widening or erosive changes.

Lower lumbar spine demonstrates posterior spinal and interbody
fusion at L4-L5.

Alignment

Normal. No subluxation.

Joint effusion

No joint effusions.

Labrum

No labral tear.

Cartilage

No full-thickness articular cartilage defect.

Capsule and ligaments

Normal.

Muscles and Tendons

Flexors: Normal.

Extensors: Normal.

Abductors: Normal.

Adductors: Normal.

Rotators: Normal.

Hamstrings: Normal.

Other Findings

No bursal fluid.

Viscera

No abnormality seen in pelvis. No lymphadenopathy. No free fluid in
the pelvis. Hysterectomy changes.
IMPRESSION: 1.  No evidence of fracture or osteonecrosis.

2.  No significant hip osteoarthritis or joint effusion.

3.  Muscles and tendons are intact.  No evidence of bursitis.

## 2022-04-15 DIAGNOSIS — F320 Major depressive disorder, single episode, mild: Secondary | ICD-10-CM | POA: Diagnosis not present

## 2022-04-28 DIAGNOSIS — F320 Major depressive disorder, single episode, mild: Secondary | ICD-10-CM | POA: Diagnosis not present

## 2022-05-08 ENCOUNTER — Telehealth: Payer: Self-pay

## 2022-05-08 NOTE — Telephone Encounter (Signed)
PA for AJOVY (fremanezumab-vfrm) injection 225MG /1.5ML auto-injectors submitted via CMM. Key: [facility] Your information has been submitted to [place]. [employer] [place] will review the request and notify you of the determination decision directly, typically within 72 hours of receiving all information

## 2022-05-12 DIAGNOSIS — F320 Major depressive disorder, single episode, mild: Secondary | ICD-10-CM | POA: Diagnosis not present

## 2022-05-15 NOTE — Telephone Encounter (Signed)
[employer] additional clinical information form received, completed and placed on NP desk for signature.

## 2022-05-15 NOTE — Telephone Encounter (Signed)
Forms signed and faxed back to [facility] at [phone]. Confirmation received

## 2022-05-15 NOTE — Telephone Encounter (Signed)
signed

## 2022-05-21 ENCOUNTER — Telehealth: Payer: Self-pay | Admitting: *Deleted

## 2022-05-21 NOTE — Telephone Encounter (Signed)
Gerald Leitz Key: [id number]  Your information has been submitted to [place]. [employer] [place] will review the request and notify you of the determination decision directly, typically within 72 hours of receiving all information. If [employer] has not responded within the specified timeframe or if you have any questions about your PA submission, contact [facility] directly at [phone].

## 2022-05-26 DIAGNOSIS — F320 Major depressive disorder, single episode, mild: Secondary | ICD-10-CM | POA: Diagnosis not present

## 2022-05-26 NOTE — Telephone Encounter (Signed)
Contacted [employer] spoke to Kerby, she stated Ajovy meets criteria for approval.  Effective date 05/15/22-08/06/22

## 2022-05-26 NOTE — Telephone Encounter (Signed)
Called to check status of [place] PA, spoke with Angela Nevin who stated they faxed form to be completed re: Missing information. I advised we never got the form.  She transferred me to [facility] [place] is case  Ref # [id number]. She completed clinical questions, updated case and sent for review. Decision 10/19 via fax.

## 2022-05-27 ENCOUNTER — Encounter: Payer: Self-pay | Admitting: *Deleted

## 2022-05-27 NOTE — Telephone Encounter (Signed)
Anna Wilson Approved today Effective from 05/21/2022 through 08/12/2022.

## 2022-06-09 DIAGNOSIS — F320 Major depressive disorder, single episode, mild: Secondary | ICD-10-CM | POA: Diagnosis not present

## 2022-06-19 DIAGNOSIS — M5416 Radiculopathy, lumbar region: Secondary | ICD-10-CM | POA: Diagnosis not present

## 2022-06-19 DIAGNOSIS — M4326 Fusion of spine, lumbar region: Secondary | ICD-10-CM | POA: Diagnosis not present

## 2022-06-19 DIAGNOSIS — Z6824 Body mass index (BMI) 24.0-24.9, adult: Secondary | ICD-10-CM | POA: Diagnosis not present

## 2022-06-23 DIAGNOSIS — F320 Major depressive disorder, single episode, mild: Secondary | ICD-10-CM | POA: Diagnosis not present

## 2022-06-28 ENCOUNTER — Other Ambulatory Visit: Payer: Self-pay | Admitting: Adult Health

## 2022-07-07 DIAGNOSIS — F320 Major depressive disorder, single episode, mild: Secondary | ICD-10-CM | POA: Diagnosis not present

## 2022-07-10 ENCOUNTER — Encounter: Payer: Self-pay | Admitting: Adult Health

## 2022-07-10 ENCOUNTER — Ambulatory Visit: Payer: BC Managed Care – PPO | Admitting: Adult Health

## 2022-07-10 ENCOUNTER — Other Ambulatory Visit: Payer: Self-pay | Admitting: Adult Health

## 2022-07-10 VITALS — BP 134/91 | HR 65 | Ht 64.0 in | Wt 154.6 lb

## 2022-07-10 DIAGNOSIS — G43711 Chronic migraine without aura, intractable, with status migrainosus: Secondary | ICD-10-CM

## 2022-07-10 MED ORDER — QULIPTA 30 MG PO TABS: 30.0000 mg | ORAL_TABLET | Freq: Every day | ORAL | 5 refills | Status: DC

## 2022-07-10 NOTE — Progress Notes (Signed)
PATIENT: Anna Wilson DOB: 13-Jul-1971  REASON FOR VISIT: follow up HISTORY FROM: patient PRIMARY NEUROLOGIST:   Chief Complaint  Patient presents with   Follow-up    Rm 19, son.  migraines. Ajovy not helping, and Bernita RaisinUbrelvy stopped working.  Has had migraines for about 2 wks, wakes her up at night.       HISTORY OF PRESENT ILLNESS: Today 07/10/22: Ms. Anna Wilson is a 51 year old female with a history of migraine headaches.  She returns today for follow-up.  She reports that she Has had current headache for atleast 3 weeks. Gets better, then worse but never fully goes away. Wearing her hair up in a ponytail, or staying up late will trigger migraine. Prior to this headache she was having about 3 severe migraines that put her in the bed. 3 days a week she will have a headache. States that she has no headache free days.  Reports that taking Bernita RaisinUbrelvy she does not notice any change in her headache.  12/23/21: Ms. Anna Wilson is a 51 year old female with a history of migraine headaches.  She returns today for follow-up.She thinks that headaches are better. Having about 2-3 headaches a week. Sometimes Bernita RaisinUbrelvy resolves the headache other times it knocks the edge off.   Having transforminal lumbar interbody fusion tomorrow- due to sciatica, pain in hip, numbness in feet. Has had injections with no long term benefit.   Reports that her orthopedist Dr. Luiz BlareGraves thinks that she may have thoracic outlet syndrome.  She has a letter that states that she should follow-up with her neurologist.  However a referral with notes and testing has not been sent to our office to review.  Unfortunately we cannot see the notes in epic.  Patient states that her symptoms are not bothersome to her right now.  Had a sleep study that did not show sleep apnea  June 28, 2021 Ms. Anna Wilson is a 51 year old female with a history of migraine headaches.  She returns today for follow-up.  At the last visit she was started on Ajovy.  She continues on  Topamax.  She is also on Ubrelvy for abortive therapy.  She reports that her headaches have gotten better.  She states in the last week she was put on opioids due to her recent surgery to remove kidney stones.  She states that they punctured the cyst and she developed a hematoma which is causing her pain.  She does not plan to be on opioids long-term.  She states that the opioids do increase her headache frequency.  She states that typically when she takes Vanuatubrelvy within about 30 minutes the headache decreases but sometimes does not resolve.  She was on Imitrex in the past but it lost its effectiveness.  Patient was sent for a sleep evaluation.  She has a pending sleep study scheduled.  MRI of the brain was relatively unremarkable  The patient states that she has been seeing a spine specialist/orthopedist for her back and shoulder.  She states that she felt in March of this year and since then her neck has been hurting.  She did have a CT scan of the neck that was relatively unremarkable.  Reports that she has had pain that radiates down the left arm with numbness in the fingertips.  She has had nerve conduction studies that was relatively unremarkable she had carpal tunnel release surgery in March in the left hand.  Continues to have numbness in the hand.  Reports that her pain in the neck  starts in the back of the head and radiates down the neck and typically down the left arm.  Reports decreased range of motion with the neck particularly when she turns her head to the left.    HISTORY (copied from Dr. Trevor Mace note)  Anna Wilson is a 51 y.o. female here as requested by Julianne Handler, NP for headache. PMHx ankylosing spondylitis, anxiety, carpal tunnel syndrome, degenerative disc disease lumbosacral, frequency of urination, history of kidney stones, recurrent UTIs, hypertension, migraines, osteoarthritis, peptic ulcer, hyperlipidemia, hypertension, osteoarthritis, renal calculus in the left, migraines,  adhd, AKI, anxiety.    Migraines since the age of 68. Also half-sister. And her child who is 20 starting to have them. She started seeing a neurologist at the age of 66 until her 60s, she also [facility], and again saw a neurologist in 2016 for left arm pain in [place]. Since then just seeing primary care and using imitrex. Topamax was prescribed and the Topamax started helping but she is having issues with kidney stones. She has a spine doctor. She has brain fog, the words are not there. She also has fibromyalgia. She hs a headache "all the time", she has a daily chronic headache, her husband is here and provides much information as well, he says she snores, she wake often at night. Wakes up with morning headaches, she is extremely fatigue, she naps every day 3-4 hours long. Migraines come on the left, pulsating/pounding/throbbing, acute treatment like caffeine can help. A dark room helps, nausea and vomiting, pounding/pulsating and throbbing, sensitivity of skin, sleeping helps, can last 24 hours, moderate to severe, 12-15 moderately severe severe to severe migraine days a month for > 4 months. She has had trigger point injections and dry needling, she has associated neck pain and follow with a spine doctor. Vision changes, vision loss.    Reviewed notes, labs and imaging from outside physicians, which showed:   I reviewed notes from requesting provider, patient was seen in the emergency room, she just had bilateral shoulder/carpal tunnel surgeries, on 327 she was walking up the stairs at her house when she stumbled, fell forward and tumbled down the stairs, no loss of consciousness but noted increased neck and shoulder pain, she has an orthopedist, she continues to have moderate aching right-sided neck and right-sided migraine symptoms, she completed a course of steroids about a week ago and is continue to take tizanidine and Norco without improvement, when she is lying on her  left side it feels like a water balloon is pressing on her neck, pain is not associated with fever, nausea or vomiting, she has not had tingling in the arms or legs.  I reviewed physical examination which was normal including head ears eyes nose throat cardiovascular pulmonary and neurologic.  I reviewed labs BMP was unremarkable, creatinine 1.01, CBC did show elevated white blood cells otherwise unremarkable.  CT of the cervical spine showed nothing pathologic, no acute displaced fracture or traumatic listhesis of the cervical spine.  She was given Toradol and Reglan diagnosed with musculoskeletal neck pain and exacerbation of her migraines.       CT head/4/22:  showed No acute intracranial abnormalities including mass lesion or mass effect, hydrocephalus, extra-axial fluid collection, midline shift, hemorrhage, or acute infarction, large ischemic events (personally reviewed images)     From a thorough review of records, medications tried that can be used in migraine management include: Tylenol, amlodipine (calcium channel blocker in the same class as  verapamil close parentheses, valsartan, aspirin, Decadron injection, Benadryl injections, Cymbalta, Lexapro, gabapentin, Toradol injections, Skelaxin, metoprolol, naproxen, ondansetron tablets and injections, topiramate is contraindicated due to history of nephrolithiasis, Phenergan injections and oral, scopolamine patches, amitriptyline, Imitrex, tizanidine, she has tried Topamax and is currently on Topamax, Ubrelvy.   Tsh normal  REVIEW OF SYSTEMS: Out of a complete 14 system review of symptoms, the patient complains only of the following symptoms, and all other reviewed systems are negative.  See HPI  ALLERGIES: Allergies  Allergen Reactions   Nsaids Other (See Comments)    Ulcers   Sulfa Antibiotics Other (See Comments)    Pt states "I bleed from my body orficese when I take sulfa drugs"   Sulfasalazine Other (See Comments)    Pt states "I  bleed from my body orficese when I take sulfa drugs"    HOME MEDICATIONS: Outpatient Medications Prior to Visit  Medication Sig Dispense Refill   AJOVY 225 MG/1.5ML SOAJ INJECT 225 MG INTO THE SKIN EVERY 30 (THIRTY) DAYS. 1.5 mL 3   amLODipine-valsartan (EXFORGE) 10-160 MG tablet Take 1 tablet by mouth daily.     Coenzyme Q10 (COQ-10) 100 MG capsule Take 100 mg by mouth daily.     docusate sodium (COLACE) 100 MG capsule Take 100 mg by mouth daily.     DULoxetine (CYMBALTA) 60 MG capsule Take 60 mg by mouth daily.     escitalopram (LEXAPRO) 20 MG tablet Take 20 mg by mouth daily.     gabapentin (NEURONTIN) 300 MG capsule Take 600 mg by mouth 3 (three) times daily.     HUMIRA PEN 40 MG/0.4ML PNKT Inject 40 mg into the skin every 14 (fourteen) days.     Multiple Vitamin (MULTIVITAMIN ADULT PO) Take 1 tablet by mouth at bedtime.      Omega-3 1000 MG CAPS Take 1,000 mg by mouth at bedtime.      ondansetron (ZOFRAN) 4 MG tablet Take 1 tablet (4 mg total) by mouth every 8 (eight) hours as needed for nausea or vomiting (nausea/vomiting). 20 tablet 0   OVER THE COUNTER MEDICATION Taking K-2 MK- plus D3 5000 units daily Magnesium Glycinate 665 mg one tab daily.     pantoprazole (PROTONIX) 40 MG tablet Take 40 mg by mouth 2 (two) times daily.     pravastatin (PRAVACHOL) 20 MG tablet Take 20 mg by mouth daily.     Probiotic Product (PROBIOTIC DAILY PO) Take 1 capsule by mouth daily. 62 billion     Red Yeast Rice Extract 600 MG CAPS Take 600 mg by mouth at bedtime.     tiZANidine (ZANAFLEX) 4 MG capsule Take 4 mg by mouth 3 (three) times daily as needed for muscle spasms.     Turmeric (QC TUMERIC COMPLEX PO) Take 1 capsule by mouth daily.     UBRELVY 100 MG TABS TAKE 1 TABLET AT THE ONSET OF MIGRAINE. REPEAT IN 2 HOURS IF NEEDED. ONLY 2 TABS/24 HOURS. 15 tablet 0   UNABLE TO FIND OTC Cranberry Supplement 1500mg  daily     estradiol (ESTRACE) 0.5 MG tablet Take 0.5 mg by mouth daily.     Potassium  Citrate 15 MEQ (1620 MG) TBCR Take 1 tablet by mouth 2 (two) times daily.     prednisoLONE acetate (PRED FORTE) 1 % ophthalmic suspension Place 1 drop into both eyes daily as needed (irritation).     VYVANSE 40 MG capsule Take 40 mg by mouth every morning.     No facility-administered  medications prior to visit.    PAST MEDICAL HISTORY: Past Medical History:  Diagnosis Date   Anemia    Ankylosing spondylitis (HCC)    rhemotology--- dr Dierdre Forth,  treated with humira injection   Anxiety    Carpal tunnel syndrome, bilateral    DDD (degenerative disc disease), lumbosacral    Frequency of urination    GERD (gastroesophageal reflux disease)    History of 2019 novel coronavirus disease (COVID-19) 03/24/2020   per pt had mild symptoms that resolved in 10 days and lingering cough resolved few weeks later   History of chest pain    in epic was referred to cardiology, dr Eden Emms (note 01-28-2017) by pt's pcp;  previous nuclear stress test negative for ischemia ef 69% (results in care everywhere 12-22-2016) and normal echo results 08-13-2014 in care everywhere;   pt had cardiac cath  03-17-2017 (in epic) no angiographic evidence of CAD and LVEF 65%, non-cardiac chest pain   History of esophageal spasm    per pt hx chest pain from to no be non-cardiac but esophageal spasm and takes protonix which pt stated no spasm's taking protonix   History of kidney stones    History of peptic ulcer 2016   History of recurrent UTIs    Hyperlipidemia    Hypertension    followed by pcp   Migraines    followed by pcp   OA (osteoarthritis)    Pneumonia    Renal calculus, left    Thoracic outlet syndrome    Wears glasses     PAST SURGICAL HISTORY: Past Surgical History:  Procedure Laterality Date   ABDOMINOPLASTY     ANTERIOR CRUCIATE LIGAMENT (ACL) REVISION Left 03-09-2018     arthroscopy and quadriceps tendon graft   capel tunnel Bilateral 2022   wrist   CHOLECYSTECTOMY OPEN  2009   AND  UMBILICIAL HERNIA REPAIR AND ABDOMINOPLASTY   CYSTOSCOPY W/ URETERAL STENT PLACEMENT Left 05/31/2020   Procedure: CYSTOSCOPY WITH RETROGRADE PYELOGRAM/URETERAL STENT PLACEMENT;  Surgeon: Jannifer Hick, MD;  Location: [facility];  Service: Urology;  Laterality: Left;   CYSTOSCOPY/RETROGRADE/URETEROSCOPY/STONE EXTRACTION WITH BASKET Right 02/25/2020   Procedure: CYSTOSCOPY RETROGRADE/ URETEROSCOPY/STONE EXTRACTION WITH BASKET/URETERAL STENT PLACEMENT ;  Surgeon: Malen Gauze, MD;  Location: [facility];  Service: Urology;  Laterality: Right;   CYSTOSCOPY/RETROGRADE/URETEROSCOPY/STONE EXTRACTION WITH BASKET  2012   CYSTOSCOPY/RETROGRADE/URETEROSCOPY/STONE EXTRACTION WITH BASKET Left 06/11/2020   Procedure: CYSTOSCOPY/RETROGRADE/URETEROSCOPY/ LASER LITHOTRIPSY/ STONE EXTRACTION WITH BASKET/ LEFT STENT EXCHANGE;  Surgeon: Jannifer Hick, MD;  Location: [facility];  Service: Urology;  Laterality: Left;  ONLY NEEDS 45 MIN   CYSTOSCOPY/URETEROSCOPY/HOLMIUM LASER/STENT PLACEMENT Bilateral 06/21/2021   Procedure: CYSTOSCOPY/RETROGRADE/URETEROSCOPY/HOLMIUM LASER/STENT PLACEMENT;  Surgeon: Jannifer Hick, MD;  Location: [facility];  Service: Urology;  Laterality: Bilateral;  ONLY NEEDS 60 MIN   HERNIA REPAIR     KNEE ARTHROSCOPY W/ ACL RECONSTRUCTION Left 2002   KNEE ARTHROSCOPY W/ SYNOVECTOMY Left 07-15-2018     lysis adhesions   KNEE SURGERY Left 2004 and 2017   revision scar tissue   LEFT HEART CATH AND CORONARY ANGIOGRAPHY N/A 03/17/2017   Procedure: LEFT HEART CATH AND CORONARY ANGIOGRAPHY;  Surgeon: Kathleene Hazel, MD;  Location: [facility];  Service: Cardiovascular;  Laterality: N/A;   LUMBAR SPINE SURGERY  07-15-2019     L4--5 disectomy decompression;  L5--S1 laminectomy   PELVIC LAPAROSCOPY  1992   for endometriosis   POSTERIOR LUMBAR FUSION  12-20-2019     L4--5 revision laminectomy  and fusion   SHOULDER ARTHROSCOPY Bilateral 2022   TOTAL LAPAROSCOPIC  HYSTERECTOMY WITH BILATERAL SALPINGO OOPHORECTOMY  02/26/2016    FAMILY HISTORY: Family History  Problem Relation Age of Onset   Hypertension Mother    Hypertension Father    Hypothyroidism Brother    Migraines Brother    Hypothyroidism Brother    Migraines Brother    Rheum arthritis Daughter    Asthma Daughter    Asthma Son    Migraines Niece     SOCIAL HISTORY: Social History   Socioeconomic History   Marital status: Married    Spouse name: david   Number of children: 3   Years of education: Not on file   Highest education level: Associate degree: occupational, Scientist, product/process development, or vocational program  Occupational History   Not on file  Tobacco Use   Smoking status: Former    Years: 6.00    Types: Cigarettes    Quit date: 06/07/1994    Years since quitting: 28.1   Smokeless tobacco: Never  Vaping Use   Vaping Use: Never used  Substance and Sexual Activity   Alcohol use: Yes    Comment: occasional   Drug use: No   Sexual activity: Not on file  Other Topics Concern   Not on file  Social History Narrative   Lives with husband and 1 child   Right handed   Caffeine: 1 cup of coffee a day   Social Determinants of Corporate investment banker Strain: Not on file  Food Insecurity: Not on file  Transportation Needs: Not on file  Physical Activity: Not on file  Stress: Not on file  Social Connections: Not on file  Intimate Partner Violence: Not on file      PHYSICAL EXAM  Vitals:   07/10/22 1254  BP: (!) 134/91  Pulse: 65  Weight: 154 lb 9.6 oz (70.1 kg)  Height:  (1.626 m)   Body mass index is 26.54 kg/m.  Generalized: Well developed, in no acute distress   Neurological examination  Mentation: Alert oriented to time, place, history taking. Follows all commands speech and language fluent Cranial nerve II-XII: Pupils were equal round reactive to light. Extraocular movements were full, visual field were full on confrontational test. Facial sensation  and strength were normal.  Motor: The motor testing reveals 5 over 5 strength of all 4 extremities. Good symmetric motor tone is noted throughout.  Sensory: Sensory testing is intact to soft touch on all 4 extremities. No evidence of extinction is noted.  Coordination: Cerebellar testing reveals good finger-nose-finger and heel-to-shin bilaterally.  Gait and station: Gait is normal.    DIAGNOSTIC DATA (LABS, IMAGING, TESTING) - I reviewed patient records, labs, notes, testing and imaging myself where available.  Lab Results  Component Value Date   WBC 6.5 10/21/2021   HGB 12.7 10/21/2021   HCT 40.0 10/21/2021   MCV 84.4 10/21/2021   PLT 353 10/21/2021      Component Value Date/Time   NA 140 10/21/2021 1727   NA 138 03/16/2017 1109   K 4.0 10/21/2021 1727   CL 104 10/21/2021 1727   CO2 28 10/21/2021 1727   GLUCOSE 99 10/21/2021 1727   BUN 13 10/21/2021 1727   BUN 11 03/16/2017 1109   CREATININE 0.86 10/21/2021 1727   CALCIUM 9.4 10/21/2021 1727   PROT 7.7 06/24/2021 0800   ALBUMIN 4.4 06/24/2021 0800   AST 19 06/24/2021 0800   ALT 9 06/24/2021 0800   ALKPHOS 62  06/24/2021 0800   BILITOT 0.6 06/24/2021 0800   GFRNONAA >60 10/21/2021 1727   GFRAA >60 02/26/2020 0358    Lab Results  Component Value Date   TSH 2.218 11/29/2020      ASSESSMENT AND PLAN 52 y.o. year old female  has a past medical history of Anemia, Ankylosing spondylitis (HCC), Anxiety, Carpal tunnel syndrome, bilateral, DDD (degenerative disc disease), lumbosacral, Frequency of urination, GERD (gastroesophageal reflux disease), History of 2019 novel coronavirus disease (COVID-19) (03/24/2020), History of chest pain, History of esophageal spasm, History of kidney stones, History of peptic ulcer (2016), History of recurrent UTIs, Hyperlipidemia, Hypertension, Migraines, OA (osteoarthritis), Pneumonia, Renal calculus, left, Thoracic outlet syndrome, and Wears glasses. here with :  1.  Migraine  headaches  Start Qulipta 30 mg daily  Stop Ajovy and Ubrelvy.  Follow-up in 6 months or sooner if needed        Butch Penny, MSN, NP-C 07/10/2022, 1:12 PM [facility] [address] [place], [place] [address] [phone]

## 2022-07-10 NOTE — Patient Instructions (Signed)
Your Plan:  Start Qulipta 30 mg  Stop ajovy  Stop Bernita Raisin  If your symptoms worsen or you develop new symptoms please let us know.    Thank you for coming to see [place] at [facility]. I hope we have been able to provide you high quality care today.  You may receive a patient satisfaction survey over the next few weeks. We would appreciate your feedback and comments so that we may continue to improve ourselves and the health of our patients.

## 2022-07-17 ENCOUNTER — Telehealth: Payer: Self-pay | Admitting: *Deleted

## 2022-07-17 DIAGNOSIS — G43711 Chronic migraine without aura, intractable, with status migrainosus: Secondary | ICD-10-CM

## 2022-07-17 NOTE — Telephone Encounter (Signed)
PA pending on CMM. Key: [id number].

## 2022-07-17 NOTE — Telephone Encounter (Signed)
Completed Qulipta PA on CMM. Key: [id number]. Awaiting determination from [employer].

## 2022-07-21 DIAGNOSIS — F320 Major depressive disorder, single episode, mild: Secondary | ICD-10-CM | POA: Diagnosis not present

## 2022-07-21 NOTE — Telephone Encounter (Signed)
Received denial letter from [facility]. Anna Wilson is denied due to guideline that patient must either try and fail or be unable to take all formulary alternatives which include Emgality and Aimovig. Patient has only tried Ajovy.   If appeal is desired, fax to [phone] within 180 calendar days from 07/18/2022.

## 2022-07-21 NOTE — Telephone Encounter (Signed)
Let patient know we will need to try Emgality or aimovig first

## 2022-07-28 MED ORDER — EMGALITY 120 MG/ML ~~LOC~~ SOAJ: 240.0000 mg | Freq: Once | SUBCUTANEOUS | 0 refills | Status: AC

## 2022-07-28 MED ORDER — EMGALITY 120 MG/ML ~~LOC~~ SOAJ: 120.0000 mg | SUBCUTANEOUS | 11 refills | Status: DC

## 2022-07-28 NOTE — Addendum Note (Signed)
Addended by: Bertram Savin on: 07/28/2022 02:34 PM   Modules accepted: Orders

## 2022-07-28 NOTE — Telephone Encounter (Signed)
yes

## 2022-08-18 NOTE — Addendum Note (Signed)
Addended by: Brandon Melnick on: 08/18/2022 12:35 PM   Modules accepted: Orders

## 2022-08-20 NOTE — Telephone Encounter (Signed)
Initiated [place] PA on [place] [id number] received approval effective from 08-18-2022 thru 11-09-22.

## 2022-08-27 NOTE — Telephone Encounter (Signed)
I called [facility]. They are saying they get a rejection that PA is needed as of yesterday. They will send a new PA to [place] to complete.

## 2022-09-01 ENCOUNTER — Telehealth: Payer: Self-pay | Admitting: *Deleted

## 2022-09-01 DIAGNOSIS — F320 Major depressive disorder, single episode, mild: Secondary | ICD-10-CM | POA: Diagnosis not present

## 2022-09-01 NOTE — Telephone Encounter (Signed)
Completed urgent Emgality PA on CMM. Key: [id number]. Awaiting determination from [place].

## 2022-09-01 NOTE — Telephone Encounter (Signed)
Message from [place] on file/See [id number]  I called the pharmacy. They said the loading dose will not go through but the PA I completed today asked for the loading dose and insurance is  referring back to the PA above that is approved. The patient can try to get the Emgality savings card online and present it to the pharmacy. Emgality drug reps have told us the savings offer will allow the loading dose fill right away even without PA.

## 2022-09-15 DIAGNOSIS — F320 Major depressive disorder, single episode, mild: Secondary | ICD-10-CM | POA: Diagnosis not present

## 2022-09-29 DIAGNOSIS — F320 Major depressive disorder, single episode, mild: Secondary | ICD-10-CM | POA: Diagnosis not present

## 2022-10-13 DIAGNOSIS — M25511 Pain in right shoulder: Secondary | ICD-10-CM | POA: Diagnosis not present

## 2022-10-13 DIAGNOSIS — M25512 Pain in left shoulder: Secondary | ICD-10-CM | POA: Diagnosis not present

## 2022-10-13 DIAGNOSIS — M797 Fibromyalgia: Secondary | ICD-10-CM | POA: Diagnosis not present

## 2022-10-13 DIAGNOSIS — M450 Ankylosing spondylitis of multiple sites in spine: Secondary | ICD-10-CM | POA: Diagnosis not present

## 2022-10-13 DIAGNOSIS — Z111 Encounter for screening for respiratory tuberculosis: Secondary | ICD-10-CM | POA: Diagnosis not present

## 2022-10-14 DIAGNOSIS — J9801 Acute bronchospasm: Secondary | ICD-10-CM | POA: Diagnosis not present

## 2022-10-27 DIAGNOSIS — F320 Major depressive disorder, single episode, mild: Secondary | ICD-10-CM | POA: Diagnosis not present

## 2022-11-04 ENCOUNTER — Other Ambulatory Visit: Payer: Self-pay | Admitting: Adult Health

## 2022-11-04 DIAGNOSIS — G43711 Chronic migraine without aura, intractable, with status migrainosus: Secondary | ICD-10-CM

## 2023-01-02 ENCOUNTER — Other Ambulatory Visit (HOSPITAL_COMMUNITY): Payer: Self-pay

## 2023-01-02 ENCOUNTER — Telehealth: Payer: Self-pay

## 2023-01-02 NOTE — Telephone Encounter (Signed)
Patient Advocate Encounter   Received notification from [employer] that prior authorization is required for Emgality 120MG /ML auto-injectors (migraine)   Submitted: 01-02-2023 Key [id number]  Status is pending

## 2023-01-04 ENCOUNTER — Other Ambulatory Visit (HOSPITAL_COMMUNITY): Payer: Self-pay

## 2023-01-04 NOTE — Telephone Encounter (Signed)
Pharmacy Patient Advocate Encounter  Prior Authorization for Emgality 120MG /ML auto-injectors (migraine) has been approved by [employer] (ins).    PA # N/A Effective dates: 01/02/2023 through 01/02/2024  Copay is $40.00 per test claim

## 2023-01-19 ENCOUNTER — Encounter: Payer: Self-pay | Admitting: Adult Health

## 2023-01-19 ENCOUNTER — Ambulatory Visit: Payer: Managed Care, Other (non HMO) | Admitting: Adult Health

## 2023-01-19 ENCOUNTER — Telehealth: Payer: Self-pay | Admitting: *Deleted

## 2023-01-19 VITALS — BP 118/78 | HR 68 | Ht 64.0 in | Wt 151.0 lb

## 2023-01-19 DIAGNOSIS — G43719 Chronic migraine without aura, intractable, without status migrainosus: Secondary | ICD-10-CM

## 2023-01-19 MED ORDER — NURTEC 75 MG PO TBDP: ORAL_TABLET | ORAL | 5 refills | Status: DC

## 2023-01-19 NOTE — Telephone Encounter (Signed)
Per Aundra Millet NP, we are going to start Botox and stop Emgality.    Chronic Migraine CPT 64615  Botox J0585 Units:200  G43.719 Chronic Migraine without aura,  intractable, without status migrainous

## 2023-01-19 NOTE — Progress Notes (Signed)
PATIENT: Anna Wilson DOB: [birth year]  REASON FOR VISIT: follow up HISTORY FROM: patient PRIMARY NEUROLOGIST: Dr. Lucia Gaskins   Chief Complaint  Patient presents with   RM 4    Patient is here alone for a 6 month follow-up for her migraines. She states the migraines are still basically the same. She takes Merchant navy officer for prevention. She was taking Ubrelvy but it didn't help. She has zofran if needed. She also uses ice, dark rooms, ice caps, hot showers, tylenol.     HISTORY OF PRESENT ILLNESS: Today 01/19/23:  Anna Wilson is a 52 y.o. female with a history of Migraine Headaches. Returns today for follow-up. Emgality has not helped at all. Has about 3-4 migraines a week. May be more often if she is out in the sun. Always has a dull headache located in the back, center of the head. Sometimes she will feel this pain getting worse. Zofran has helped. Gets car sick and zofran helps with that. Occasionally will take tylenol. Uses a cold mask/cap. Sometimes can take a hot shower and throw up and that relieves her headache. With severe headaches, gets a lot of nausea and then will vomit.  She reports that she has gotten an aura in the past that will be a gridlike sensation in her vision but this is not common for her.  Her typical headache is without an aura.  From a thorough review of records, medications tried that can be used in migraine management include: Tylenol, amlodipine (calcium channel blocker in the same class as verapamil close parentheses, valsartan, aspirin, Decadron injection, Benadryl injections, Cymbalta, Lexapro, gabapentin, Toradol injections, Skelaxin, metoprolol, naproxen, ondansetron tablets and injections, topiramate is contraindicated due to history of nephrolithiasis, Phenergan injections and oral, scopolamine patches, amitriptyline, Imitrex, tizanidine, she has tried Topamax, Lin Givens, [place], Qulipta    07/10/22: Anna Wilson is a 52 year old female with a history of migraine  headaches.  She returns today for follow-up.  She reports that she Has had current headache for atleast 3 weeks. Gets better, then worse but never fully goes away. Wearing her hair up in a ponytail, or staying up late will trigger migraine. Prior to this headache she was having about 3 severe migraines that put her in the bed. 3 days a week she will have a headache. States that she has no headache free days.  Reports that taking Bernita Raisin she does not notice any change in her headache.  12/23/21: Anna Wilson is a 52 year old female with a history of migraine headaches.  She returns today for follow-up.She thinks that headaches are better. Having about 2-3 headaches a week. Sometimes Bernita Raisin resolves the headache other times it knocks the edge off.   Having transforminal lumbar interbody fusion tomorrow- due to sciatica, pain in hip, numbness in feet. Has had injections with no long term benefit.   Reports that her orthopedist Dr. Luiz Blare thinks that she may have thoracic outlet syndrome.  She has a letter that states that she should follow-up with her neurologist.  However a referral with notes and testing has not been sent to our office to review.  Unfortunately we cannot see the notes in epic.  Patient states that her symptoms are not bothersome to her right now.  Had a sleep study that did not show sleep apnea  June 28, 2021 Anna Wilson is a 52 year old female with a history of migraine headaches.  She returns today for follow-up.  At the last visit she was started on Ajovy.  She  continues on Topamax.  She is also on Ubrelvy for abortive therapy.  She reports that her headaches have gotten better.  She states in the last week she was put on opioids due to her recent surgery to remove kidney stones.  She states that they punctured the cyst and she developed a hematoma which is causing her pain.  She does not plan to be on opioids long-term.  She states that the opioids do increase her headache frequency.  She  states that typically when she takes [place] within about 30 minutes the headache decreases but sometimes does not resolve.  She was on Imitrex in the past but it lost its effectiveness.  Patient was sent for a sleep evaluation.  She has a pending sleep study scheduled.  MRI of the brain was relatively unremarkable  The patient states that she has been seeing a spine specialist/orthopedist for her back and shoulder.  She states that she felt in March of this year and since then her neck has been hurting.  She did have a CT scan of the neck that was relatively unremarkable.  Reports that she has had pain that radiates down the left arm with numbness in the fingertips.  She has had nerve conduction studies that was relatively unremarkable she had carpal tunnel release surgery in March in the left hand.  Continues to have numbness in the hand.  Reports that her pain in the neck starts in the back of the head and radiates down the neck and typically down the left arm.  Reports decreased range of motion with the neck particularly when she turns her head to the left.    HISTORY (copied from Dr. Trevor Mace note)  Anna Wilson is a 52 y.o. female here as requested by Julianne Handler, NP for headache. PMHx ankylosing spondylitis, anxiety, carpal tunnel syndrome, degenerative disc disease lumbosacral, frequency of urination, history of kidney stones, recurrent UTIs, hypertension, migraines, osteoarthritis, peptic ulcer, hyperlipidemia, hypertension, osteoarthritis, renal calculus in the left, migraines, adhd, AKI, anxiety.    Migraines since the age of 60. Also half-sister. And her child who is 20 starting to have them. She started seeing a neurologist at the age of 60 until her 55s, she also [facility], and again saw a neurologist in 2016 for left arm pain in [place]. Since then just seeing primary care and using imitrex. Topamax was prescribed and the Topamax started helping but she is  having issues with kidney stones. She has a spine doctor. She has brain fog, the words are not there. She also has fibromyalgia. She hs a headache "all the time", she has a daily chronic headache, her husband is here and provides much information as well, he says she snores, she wake often at night. Wakes up with morning headaches, she is extremely fatigue, she naps every day 3-4 hours long. Migraines come on the left, pulsating/pounding/throbbing, acute treatment like caffeine can help. A dark room helps, nausea and vomiting, pounding/pulsating and throbbing, sensitivity of skin, sleeping helps, can last 24 hours, moderate to severe, 12-15 moderately severe severe to severe migraine days a month for > 4 months. She has had trigger point injections and dry needling, she has associated neck pain and follow with a spine doctor. Vision changes, vision loss.    Reviewed notes, labs and imaging from outside physicians, which showed:   I reviewed notes from requesting provider, patient was seen in the emergency room, she just had bilateral shoulder/carpal tunnel  surgeries, on 327 she was walking up the stairs at her house when she stumbled, fell forward and tumbled down the stairs, no loss of consciousness but noted increased neck and shoulder pain, she has an orthopedist, she continues to have moderate aching right-sided neck and right-sided migraine symptoms, she completed a course of steroids about a week ago and is continue to take tizanidine and Norco without improvement, when she is lying on her left side it feels like a water balloon is pressing on her neck, pain is not associated with fever, nausea or vomiting, she has not had tingling in the arms or legs.  I reviewed physical examination which was normal including head ears eyes nose throat cardiovascular pulmonary and neurologic.  I reviewed labs BMP was unremarkable, creatinine 1.01, CBC did show elevated white blood cells otherwise unremarkable.  CT of the  cervical spine showed nothing pathologic, no acute displaced fracture or traumatic listhesis of the cervical spine.  She was given Toradol and Reglan diagnosed with musculoskeletal neck pain and exacerbation of her migraines.       CT head/4/22:  showed No acute intracranial abnormalities including mass lesion or mass effect, hydrocephalus, extra-axial fluid collection, midline shift, hemorrhage, or acute infarction, large ischemic events (personally reviewed images)     From a thorough review of records, medications tried that can be used in migraine management include: Tylenol, amlodipine (calcium channel blocker in the same class as verapamil close parentheses, valsartan, aspirin, Decadron injection, Benadryl injections, Cymbalta, Lexapro, gabapentin, Toradol injections, Skelaxin, metoprolol, naproxen, ondansetron tablets and injections, topiramate is contraindicated due to history of nephrolithiasis, Phenergan injections and oral, scopolamine patches, amitriptyline, Imitrex, tizanidine, she has tried Topamax and is currently on Topamax, Ubrelvy.   Tsh normal  REVIEW OF SYSTEMS: Out of a complete 14 system review of symptoms, the patient complains only of the following symptoms, and all other reviewed systems are negative.  See HPI  ALLERGIES: Allergies  Allergen Reactions   Nsaids Other (See Comments)    Ulcers   Sulfa Antibiotics Other (See Comments)    Pt states "I bleed from my body orficese when I take sulfa drugs"   Sulfasalazine Other (See Comments)    Pt states "I bleed from my body orficese when I take sulfa drugs"    HOME MEDICATIONS: Outpatient Medications Prior to Visit  Medication Sig Dispense Refill   amLODipine-valsartan (EXFORGE) 10-160 MG tablet Take 1 tablet by mouth daily.     Coenzyme Q10 (COQ-10) 100 MG capsule Take 100 mg by mouth daily.     docusate sodium (COLACE) 100 MG capsule Take 100 mg by mouth daily.     DULoxetine (CYMBALTA) 60 MG capsule Take 60 mg  by mouth daily.     escitalopram (LEXAPRO) 20 MG tablet Take 20 mg by mouth daily.     gabapentin (NEURONTIN) 300 MG capsule Take 600 mg by mouth 3 (three) times daily. Takes an additional 300 mg with bedtime dose.     Galcanezumab-gnlm (EMGALITY) 120 MG/ML SOAJ Inject 120 mg into the skin every 30 (thirty) days. 1 mL 3   HUMIRA PEN 40 MG/0.4ML PNKT Inject 40 mg into the skin every 14 (fourteen) days.     Multiple Vitamin (MULTIVITAMIN ADULT PO) Take 1 tablet by mouth at bedtime.      Omega-3 1000 MG CAPS Take 1,000 mg by mouth at bedtime.      ondansetron (ZOFRAN) 4 MG tablet Take 1 tablet (4 mg total) by mouth every 8 (eight)  hours as needed for nausea or vomiting (nausea/vomiting). 20 tablet 0   OVER THE COUNTER MEDICATION Taking K-2 MK- plus D3 5000 units daily Magnesium Glycinate 665 mg one tab daily.     pantoprazole (PROTONIX) 40 MG tablet Take 40 mg by mouth 2 (two) times daily.     pravastatin (PRAVACHOL) 20 MG tablet Take 20 mg by mouth daily.     Probiotic Product (PROBIOTIC DAILY PO) Take 1 capsule by mouth daily. 62 billion     Red Yeast Rice Extract 600 MG CAPS Take 600 mg by mouth at bedtime.     tiZANidine (ZANAFLEX) 4 MG capsule Take 4 mg by mouth 3 (three) times daily as needed for muscle spasms.     Turmeric (QC TUMERIC COMPLEX PO) Take 1 capsule by mouth daily.     UNABLE TO FIND OTC Cranberry Supplement 1500mg  daily     No facility-administered medications prior to visit.    PAST MEDICAL HISTORY: Past Medical History:  Diagnosis Date   Anemia    Ankylosing spondylitis (HCC)    rhemotology--- dr Dierdre Forth,  treated with humira injection   Anxiety    Carpal tunnel syndrome, bilateral    DDD (degenerative disc disease), lumbosacral    Frequency of urination    GERD (gastroesophageal reflux disease)    History of 2019 novel coronavirus disease (COVID-19) 03/24/2020   per pt had mild symptoms that resolved in 10 days and lingering cough resolved few weeks later    History of chest pain    in epic was referred to cardiology, dr Eden Emms (note 01-28-2017) by pt's pcp;  previous nuclear stress test negative for ischemia ef 69% (results in care everywhere 12-22-2016) and normal echo results 08-13-2014 in care everywhere;   pt had cardiac cath @[facility]  03-17-2017 (in epic) no angiographic evidence of CAD and LVEF 65%, non-cardiac chest pain   History of esophageal spasm    per pt hx chest pain from to no be non-cardiac but esophageal spasm and takes protonix which pt stated no spasm's taking protonix   History of kidney stones    History of peptic ulcer 2016   History of recurrent UTIs    Hyperlipidemia    Hypertension    followed by pcp   Migraines    followed by pcp   OA (osteoarthritis)    Pneumonia    Renal calculus, left    Thoracic outlet syndrome    Wears glasses     PAST SURGICAL HISTORY: Past Surgical History:  Procedure Laterality Date   ABDOMINOPLASTY     ANTERIOR CRUCIATE LIGAMENT (ACL) REVISION Left 03-09-2018  @[facility]    arthroscopy and quadriceps tendon graft   capel tunnel Bilateral 2022   wrist   CHOLECYSTECTOMY OPEN  2009   AND UMBILICIAL HERNIA REPAIR AND ABDOMINOPLASTY   CYSTOSCOPY W/ URETERAL STENT PLACEMENT Left 05/31/2020   Procedure: CYSTOSCOPY WITH RETROGRADE PYELOGRAM/URETERAL STENT PLACEMENT;  Surgeon: Jannifer Hick, MD;  Location: [facility];  Service: Urology;  Laterality: Left;   CYSTOSCOPY/RETROGRADE/URETEROSCOPY/STONE EXTRACTION WITH BASKET Right 02/25/2020   Procedure: CYSTOSCOPY RETROGRADE/ URETEROSCOPY/STONE EXTRACTION WITH BASKET/URETERAL STENT PLACEMENT ;  Surgeon: Malen Gauze, MD;  Location: [facility];  Service: Urology;  Laterality: Right;   CYSTOSCOPY/RETROGRADE/URETEROSCOPY/STONE EXTRACTION WITH BASKET  2012   CYSTOSCOPY/RETROGRADE/URETEROSCOPY/STONE EXTRACTION WITH BASKET Left 06/11/2020   Procedure: CYSTOSCOPY/RETROGRADE/URETEROSCOPY/ LASER LITHOTRIPSY/ STONE EXTRACTION WITH BASKET/ LEFT STENT EXCHANGE;   Surgeon: Jannifer Hick, MD;  Location: [facility];  Service: Urology;  Laterality: Left;  ONLY  NEEDS 45 MIN   CYSTOSCOPY/URETEROSCOPY/HOLMIUM LASER/STENT PLACEMENT Bilateral 06/21/2021   Procedure: CYSTOSCOPY/RETROGRADE/URETEROSCOPY/HOLMIUM LASER/STENT PLACEMENT;  Surgeon: Jannifer Hick, MD;  Location: [facility];  Service: Urology;  Laterality: Bilateral;  ONLY NEEDS 60 MIN   HERNIA REPAIR     KNEE ARTHROSCOPY W/ ACL RECONSTRUCTION Left 2002   KNEE ARTHROSCOPY W/ SYNOVECTOMY Left 07-15-2018  @[facility]    lysis adhesions   KNEE SURGERY Left 2004 and 2017   revision scar tissue   LEFT HEART CATH AND CORONARY ANGIOGRAPHY N/A 03/17/2017   Procedure: LEFT HEART CATH AND CORONARY ANGIOGRAPHY;  Surgeon: Kathleene Hazel, MD;  Location: [facility];  Service: Cardiovascular;  Laterality: N/A;   LUMBAR SPINE SURGERY  07-15-2019  @[facility]    L4--5 disectomy decompression;  L5--S1 laminectomy   PELVIC LAPAROSCOPY  1992   for endometriosis   POSTERIOR LUMBAR FUSION  12-20-2019  @[facility]    L4--5 revision laminectomy and fusion   SHOULDER ARTHROSCOPY Bilateral 2022   TOTAL LAPAROSCOPIC HYSTERECTOMY WITH BILATERAL SALPINGO OOPHORECTOMY  02/26/2016    FAMILY HISTORY: Family History  Problem Relation Age of Onset   Hypertension Mother    Hypertension Father    Hypothyroidism Brother    Migraines Brother    Hypothyroidism Brother    Migraines Brother    Rheum arthritis Daughter    Asthma Daughter    Asthma Son    Migraines Niece     SOCIAL HISTORY: Social History   Socioeconomic History   Marital status: Married    Spouse name: david   Number of children: 3   Years of education: Not on file   Highest education level: Associate degree: occupational, Scientist, product/process development, or vocational program  Occupational History   Not on file  Tobacco Use   Smoking status: Former    Years: 6    Types: Cigarettes    Quit date: 06/07/1994    Years since quitting: 28.6   Smokeless tobacco:  Never  Vaping Use   Vaping Use: Never used  Substance and Sexual Activity   Alcohol use: Yes    Comment: occasional   Drug use: No   Sexual activity: Not on file  Other Topics Concern   Not on file  Social History Narrative   Lives with husband and a great nephew (25 y.o.) they have custody of, and a yorkie    Right handed   Caffeine: 1 cup of coffee a day   Social Determinants of Health   Financial Resource Strain: Not on file  Food Insecurity: Not on file  Transportation Needs: Not on file  Physical Activity: Not on file  Stress: Not on file  Social Connections: Not on file  Intimate Partner Violence: Not on file      PHYSICAL EXAM  Vitals:   01/19/23 1341  BP: 118/78  Pulse: 68  Weight: 151 lb (68.5 kg)  Height: 5\' 4"  (1.626 m)   Body mass index is 25.92 kg/m.  Generalized: Well developed, in no acute distress   Neurological examination  Mentation: Alert oriented to time, place, history taking. Follows all commands speech and language fluent Cranial nerve II-XII: Pupils were equal round reactive to light. Extraocular movements were full, visual field were full on confrontational test. Facial sensation and strength were normal.  Motor: The motor testing reveals 5 over 5 strength of all 4 extremities. Good symmetric motor tone is noted throughout.  Sensory: Sensory testing is intact to soft touch on all 4 extremities. No evidence of extinction is noted.  Coordination:  Cerebellar testing reveals good finger-nose-finger and heel-to-shin bilaterally.  Gait and station: Gait is normal.    DIAGNOSTIC DATA (LABS, IMAGING, TESTING) - I reviewed patient records, labs, notes, testing and imaging myself where available.  Lab Results  Component Value Date   WBC 6.5 10/21/2021   HGB 12.7 10/21/2021   HCT 40.0 10/21/2021   MCV 84.4 10/21/2021   PLT 353 10/21/2021      Component Value Date/Time   NA 140 10/21/2021 1727   NA 138 03/16/2017 1109   K 4.0 10/21/2021  1727   CL 104 10/21/2021 1727   CO2 28 10/21/2021 1727   GLUCOSE 99 10/21/2021 1727   BUN 13 10/21/2021 1727   BUN 11 03/16/2017 1109   CREATININE 0.86 10/21/2021 1727   CALCIUM 9.4 10/21/2021 1727   PROT 7.7 06/24/2021 0800   ALBUMIN 4.4 06/24/2021 0800   AST 19 06/24/2021 0800   ALT 9 06/24/2021 0800   ALKPHOS 62 06/24/2021 0800   BILITOT 0.6 06/24/2021 0800   GFRNONAA >60 10/21/2021 1727   GFRAA >60 02/26/2020 0358    Lab Results  Component Value Date   TSH 2.218 11/29/2020      ASSESSMENT AND PLAN 52 y.o. year old female  has a past medical history of Anemia, Ankylosing spondylitis (HCC), Anxiety, Carpal tunnel syndrome, bilateral, DDD (degenerative disc disease), lumbosacral, Frequency of urination, GERD (gastroesophageal reflux disease), History of 2019 novel coronavirus disease (COVID-19) (03/24/2020), History of chest pain, History of esophageal spasm, History of kidney stones, History of peptic ulcer (2016), History of recurrent UTIs, Hyperlipidemia, Hypertension, Migraines, OA (osteoarthritis), Pneumonia, Renal calculus, left, Thoracic outlet syndrome, and Wears glasses. here with :  1.  Migraine headaches  We discussed reordering Qulipta or switching to Botox.  The patient has opted to try Botox.  I reviewed risk associated with Botox injections. Stop Emgality  Start Nurtec 75 mg for abortive therapy.  Take at the onset of a migraine.  Only 1 tablet in 24 hours. I have reviewed this medication with the patient. Patient will be seen once Botox is approved     Butch Penny, MSN, NP-C 01/19/2023, 1:54 PM [facility] [address], [address] [place], [place] [address] [phone]

## 2023-01-19 NOTE — Patient Instructions (Signed)
Your Plan:  Start Botox  Stop Emgality  Try Nurtec for abortive therapy. Take at the onset of migraine. Only 1 tablet in 24 hours.  If your symptoms worsen or you develop new symptoms please let us know.   Thank you for coming to see [place] at [facility]. I hope we have been able to provide you high quality care today.  You may receive a patient satisfaction survey over the next few weeks. We would appreciate your feedback and comments so that we may continue to improve ourselves and the health of our patients.

## 2023-01-20 NOTE — Telephone Encounter (Signed)
[employer] new start form completed and placed in nurse pod for NP signature.

## 2023-02-02 NOTE — Telephone Encounter (Signed)
I faxed signed Berkley Harvey form along with clinical notes on 6/19. I received a returned fax stating that pt does not have benefits with [employer]. I called and confirmed that pt does have medical and pharmacy benefits with [employer]. I resent form to [phone].

## 2023-02-05 MED ORDER — ONABOTULINUMTOXINA 200 UNITS IJ SOLR: INTRAMUSCULAR | 3 refills | Status: DC

## 2023-02-05 NOTE — Telephone Encounter (Signed)
Received approval from Rutherford Nail: [id number] (02/02/23-02/01/24).  Please send rx to [facility] when you get a chance!

## 2023-02-05 NOTE — Addendum Note (Signed)
Addended by: Bertram Savin on: 02/05/2023 03:42 PM   Modules accepted: Orders

## 2023-02-23 ENCOUNTER — Ambulatory Visit: Payer: Managed Care, Other (non HMO) | Admitting: Internal Medicine

## 2023-02-23 ENCOUNTER — Other Ambulatory Visit (INDEPENDENT_AMBULATORY_CARE_PROVIDER_SITE_OTHER): Payer: Managed Care, Other (non HMO)

## 2023-02-23 ENCOUNTER — Encounter: Payer: Self-pay | Admitting: Internal Medicine

## 2023-02-23 VITALS — BP 120/80 | HR 100 | Ht 64.0 in | Wt 156.0 lb

## 2023-02-23 DIAGNOSIS — K219 Gastro-esophageal reflux disease without esophagitis: Secondary | ICD-10-CM

## 2023-02-23 DIAGNOSIS — R1310 Dysphagia, unspecified: Secondary | ICD-10-CM | POA: Diagnosis not present

## 2023-02-23 DIAGNOSIS — K625 Hemorrhage of anus and rectum: Secondary | ICD-10-CM | POA: Diagnosis not present

## 2023-02-23 DIAGNOSIS — D649 Anemia, unspecified: Secondary | ICD-10-CM

## 2023-02-23 LAB — CBC
HCT: 40.9 % (ref 36.0–46.0)
Hemoglobin: 13.2 g/dL (ref 12.0–15.0)
MCHC: 32.3 g/dL (ref 30.0–36.0)
MCV: 81.3 fl (ref 78.0–100.0)
Platelets: 328 10*3/uL (ref 150.0–400.0)
RBC: 5.03 Mil/uL (ref 3.87–5.11)
RDW: 14.3 % (ref 11.5–15.5)
WBC: 6 10*3/uL (ref 4.0–10.5)

## 2023-02-23 LAB — FOLATE: Folate: 22 ng/mL (ref >5.9)

## 2023-02-23 LAB — VITAMIN B12: Vitamin B-12: 416 pg/mL (ref 211–911)

## 2023-02-23 NOTE — Progress Notes (Signed)
Chief Complaint: GERD  HPI : 52 year old female with history of ankylosing spondylitis on Humira, DDD, fibromyalgia, osteoarthritis, GERD, anxiety, DDD, and migraines presents with GERD  She has history of GERD and is currently on pantoprazole 40 mg BID. She has been on PPI since 2018, which was effective originally but has stopped being effective over time. She takes the pantoprazole in the morning and at night time. She has had GERD since 2015-2016, which has been worsening over time. Her acid reflux feels like regurgitation that can occur in her mouth. Her bed is already lifted up 4 inches. She will sometimes wake up with vomit in her mouth. Endorses chest burning. She saw a GI physician Dr. Charm Barges in [place] in 2017-2018 for which she was diagnosed with esophageal spasms. She has never had an esophageal manometry test in the past. She was originally on nitroglycerin to treat esophageal spasms, but this would cause headaches so she stopped the nitroglycerin. She now just tries to breath through episodes of esophageal spasms. She has slowed digestion. Endorses some dysphagia that started last March 2023, which has happened 10-12 times since then. Dysphagia occurs to solids and liquids. She had an EGD done in the past in 2018 that showed an ulcer. She was instructed to stop taking ibuprofen, which she has complied with. Denies weight loss. Endorses some ab pain in the lower abdomen that she mainly attributes to constipation. She will have some bloating and gas. She has had rectal bleeding due to hemorrhoids in the past, and she previously considered hemorrhoidectomy. She has never had a colonoscopy in the past. She had a Cologuard that was negative in 2021. She does not have menstrual periods.  Wt Readings from Last 3 Encounters:  02/23/23 156 lb (70.8 kg)  01/19/23 151 lb (68.5 kg)  07/10/22 154 lb 9.6 oz (70.1 kg)   Past Medical History:  Diagnosis Date   Anemia    Ankylosing spondylitis (HCC)     rhemotology--- dr Dierdre Forth,  treated with humira injection   Anxiety    Carpal tunnel syndrome, bilateral    DDD (degenerative disc disease), lumbosacral    Frequency of urination    GERD (gastroesophageal reflux disease)    History of 2019 novel coronavirus disease (COVID-19) 03/24/2020   per pt had mild symptoms that resolved in 10 days and lingering cough resolved few weeks later   History of chest pain    in epic was referred to cardiology, dr Eden Emms (note 01-28-2017) by pt's pcp;  previous nuclear stress test negative for ischemia ef 69% (results in care everywhere 12-22-2016) and normal echo results 08-13-2014 in care everywhere;   pt had cardiac cath @[facility]  03-17-2017 (in epic) no angiographic evidence of CAD and LVEF 65%, non-cardiac chest pain   History of esophageal spasm    per pt hx chest pain from to no be non-cardiac but esophageal spasm and takes protonix which pt stated no spasm's taking protonix   History of kidney stones    History of peptic ulcer 2016   History of recurrent UTIs    Hyperlipidemia    Hypertension    followed by pcp   Migraines    followed by pcp   OA (osteoarthritis)    Pneumonia    Renal calculus, left    Thoracic outlet syndrome    Wears glasses      Past Surgical History:  Procedure Laterality Date   ABDOMINOPLASTY     ANTERIOR CRUCIATE LIGAMENT (ACL) REVISION Left  03-09-2018  @[facility]    arthroscopy and quadriceps tendon graft   BACK SURGERY  2023   capel tunnel Bilateral 2022   wrist   CHOLECYSTECTOMY OPEN  2009   AND UMBILICIAL HERNIA REPAIR AND ABDOMINOPLASTY   CYSTOSCOPY W/ URETERAL STENT PLACEMENT Left 05/31/2020   Procedure: CYSTOSCOPY WITH RETROGRADE PYELOGRAM/URETERAL STENT PLACEMENT;  Surgeon: Jannifer Hick, MD;  Location: [facility];  Service: Urology;  Laterality: Left;   CYSTOSCOPY/RETROGRADE/URETEROSCOPY/STONE EXTRACTION WITH BASKET Right 02/25/2020   Procedure: CYSTOSCOPY RETROGRADE/ URETEROSCOPY/STONE EXTRACTION WITH  BASKET/URETERAL STENT PLACEMENT ;  Surgeon: Malen Gauze, MD;  Location: [facility];  Service: Urology;  Laterality: Right;   CYSTOSCOPY/RETROGRADE/URETEROSCOPY/STONE EXTRACTION WITH BASKET  2012   CYSTOSCOPY/RETROGRADE/URETEROSCOPY/STONE EXTRACTION WITH BASKET Left 06/11/2020   Procedure: CYSTOSCOPY/RETROGRADE/URETEROSCOPY/ LASER LITHOTRIPSY/ STONE EXTRACTION WITH BASKET/ LEFT STENT EXCHANGE;  Surgeon: Jannifer Hick, MD;  Location: [facility];  Service: Urology;  Laterality: Left;  ONLY NEEDS 45 MIN   CYSTOSCOPY/URETEROSCOPY/HOLMIUM LASER/STENT PLACEMENT Bilateral 06/21/2021   Procedure: CYSTOSCOPY/RETROGRADE/URETEROSCOPY/HOLMIUM LASER/STENT PLACEMENT;  Surgeon: Jannifer Hick, MD;  Location: [facility];  Service: Urology;  Laterality: Bilateral;  ONLY NEEDS 60 MIN   HERNIA REPAIR     KNEE ARTHROSCOPY W/ ACL RECONSTRUCTION Left 2002   KNEE ARTHROSCOPY W/ SYNOVECTOMY Left 07-15-2018  @[facility]    lysis adhesions   KNEE SURGERY Left 2004 and 2017   revision scar tissue   LEFT HEART CATH AND CORONARY ANGIOGRAPHY N/A 03/17/2017   Procedure: LEFT HEART CATH AND CORONARY ANGIOGRAPHY;  Surgeon: Kathleene Hazel, MD;  Location: [facility];  Service: Cardiovascular;  Laterality: N/A;   LUMBAR SPINE SURGERY  07-15-2019  @[facility]    L4--5 disectomy decompression;  L5--S1 laminectomy   PELVIC LAPAROSCOPY  1992   for endometriosis   POSTERIOR LUMBAR FUSION  12-20-2019  @[facility]    L4--5 revision laminectomy and fusion   SHOULDER ARTHROSCOPY Bilateral 2022   TOTAL LAPAROSCOPIC HYSTERECTOMY WITH BILATERAL SALPINGO OOPHORECTOMY  02/26/2016   Family History  Problem Relation Age of Onset   Hypertension Mother    Hypertension Father    Hypothyroidism Brother    Migraines Brother    Hypothyroidism Brother    Migraines Brother    Rheum arthritis Daughter    Asthma Daughter    Asthma Son    Migraines Niece    Liver disease Neg Hx    Esophageal cancer Neg Hx    Colon cancer Neg Hx     Social History   Tobacco Use   Smoking status: Former    Current packs/day: 0.00    Types: Cigarettes    Start date: 06/07/1988    Quit date: 06/07/1994    Years since quitting: 28.7   Smokeless tobacco: Never  Vaping Use   Vaping status: Never Used  Substance Use Topics   Alcohol use: Yes    Comment: occasional   Drug use: No   Current Outpatient Medications  Medication Sig Dispense Refill   amLODipine-valsartan (EXFORGE) 10-160 MG tablet Take 1 tablet by mouth daily.     botulinum toxin Type A (BOTOX) 200 units injection Provider to inject 155 units into the muscles of the head and neck every 12 weeks. Discard remainder. 1 each 3   Coenzyme Q10 (COQ-10) 100 MG capsule Take 100 mg by mouth daily.     docusate sodium (COLACE) 100 MG capsule Take 100 mg by mouth daily.     DULoxetine (CYMBALTA) 60 MG capsule Take 60 mg by mouth daily.     escitalopram (  LEXAPRO) 20 MG tablet Take 20 mg by mouth daily.     gabapentin (NEURONTIN) 300 MG capsule Take 600 mg by mouth 3 (three) times daily. Takes an additional 300 mg with bedtime dose.     HUMIRA PEN 40 MG/0.4ML PNKT Inject 40 mg into the skin every 14 (fourteen) days.     Multiple Vitamin (MULTIVITAMIN ADULT PO) Take 1 tablet by mouth at bedtime.      Omega-3 1000 MG CAPS Take 1,000 mg by mouth at bedtime.      ondansetron (ZOFRAN) 4 MG tablet Take 1 tablet (4 mg total) by mouth every 8 (eight) hours as needed for nausea or vomiting (nausea/vomiting). 20 tablet 0   OVER THE COUNTER MEDICATION Taking K-2 MK- plus D3 5000 units daily Magnesium Glycinate 665 mg one tab daily.     pantoprazole (PROTONIX) 40 MG tablet Take 40 mg by mouth 2 (two) times daily.     pravastatin (PRAVACHOL) 20 MG tablet Take 20 mg by mouth daily.     Probiotic Product (PROBIOTIC DAILY PO) Take 1 capsule by mouth daily. 62 billion     Red Yeast Rice Extract 600 MG CAPS Take 600 mg by mouth at bedtime.     Rimegepant Sulfate (NURTEC) 75 MG TBDP Take 1 tablet at  the onset of migraine. Only 1 tab in 24 hours. 10 tablet 5   tiZANidine (ZANAFLEX) 4 MG capsule Take 4 mg by mouth 3 (three) times daily as needed for muscle spasms.     Turmeric (QC TUMERIC COMPLEX PO) Take 1 capsule by mouth daily.     UNABLE TO FIND OTC Cranberry Supplement 1500mg  daily     No current facility-administered medications for this visit.   Allergies  Allergen Reactions   Nsaids Other (See Comments)    Ulcers   Sulfa Antibiotics Other (See Comments)    Pt states "I bleed from my body orficese when I take sulfa drugs"   Sulfasalazine Other (See Comments)    Pt states "I bleed from my body orficese when I take sulfa drugs"    Review of Systems: All systems reviewed and negative except where noted in HPI.   Physical Exam: BP 120/80   Pulse 100   Ht 5\' 4"  (1.626 m)   Wt 156 lb (70.8 kg)   LMP  (LMP Unknown)   BMI 26.78 kg/m  Constitutional: Pleasant,well-developed, female in no acute distress. HEENT: Normocephalic and atraumatic. Conjunctivae are normal. No scleral icterus. Cardiovascular: Normal rate, regular rhythm.  Pulmonary/chest: Effort normal and breath sounds normal. No wheezing, rales or rhonchi. Abdominal: Soft, nondistended,tender in the LUQ and RUQ as well as periumbilical area. Bowel sounds active throughout. There are no masses palpable. No hepatomegaly. Extremities: No edema Neurological: Alert and oriented to person place and time. Skin: Skin is warm and dry. No rashes noted. Psychiatric: Normal mood and affect. Behavior is normal.  Labs 10/2021: CBC with nml Hb of 12.7.  Labs 12/2021: CBC with low Hb of 9.9. CMP unremarkable.  CT A/P w/o contrast 11/29/20: IMPRESSION: 1. No acute intra-abdominal process to provide cause for patient's symptoms. 2. Bilateral nonobstructive nephrolithiasis. No obstructive uropathy, hydronephrosis or other acute urinary tract abnormality. 3. Prior cholecystectomy. 4. L4-5 PLIF without acute hardware  complication. 5. Aortic Atherosclerosis (ICD10-I70.0)  CT renal stone study 06/24/21: IMPRESSION: 1. Small-moderate-sized right-sided subcapsular perirenal hematoma. 2. Multiple punctate bilateral renal calculi. Moderate right and mild left hydroureteronephrosis. No ureteral calculi are identified. No obstructing stone is seen within either  ureter.  CT A/P w/contrast 10/21/21: IMPRESSION: 1. No acute intrathoracic pathology. No CT evidence of pulmonary artery embolism. 2. Mild right hydronephrosis. 3. Excreted contrast in the right renal collecting system. Underlying urothelial lesion is not entirely excluded. 4. No bowel obstruction. Normal appendix. 5. Mild fatty liver.  ASSESSMENT AND PLAN: Refractory GERD Dysphagia Anemia Rectal bleeding Patient presents with refractory GERD, which has been progressively worsening over time and is no longer controlled with PPI BID therapy, as well as dysphagia. Thus will plan for EGD with BRAVO for further evaluation. Her last EGD in 2018 showed PUD. Perhaps patient would benefit from switching to an alternative PPI in the future or getting an antireflux procedure. Will recheck her blood counts since her last labs show that she was anemic for unclear reasons. Patient does describe some longstanding rectal bleeding so would benefit from an colonoscopy in the future, but will plan to focus on her GERD issues for now.  - GERD handout - Encourage taking one of her doses of PPI before her 4:30 PM meal if possible - Check CBC, ferritin/IBC, vitamin B12, folate - EGD with BRAVO LEC - Will need colonoscopy in the future but will see if we can get her GERD under better control first - Will try to get prior GI records from Dr. Charm Barges in [place]  I spent 62 minutes of time, including in depth chart review, independent review of results as outlined above, communicating results with the patient directly, face-to-face time with the  patient, coordinating care, ordering studies and medications as appropriate, and documentation.

## 2023-02-23 NOTE — Patient Instructions (Signed)
Your provider has requested that you go to the basement level for lab work before leaving today. Press "B" on the elevator. The lab is located at the first door on the left as you exit the elevator.   You have been scheduled for an endoscopy bravo. Please follow written instructions given to you at your visit today.  If you use inhalers (even only as needed), please bring them with you on the day of your procedure.  If you take any of the following medications, they will need to be adjusted prior to your procedure:   DO NOT TAKE 7 DAYS PRIOR TO TEST- Trulicity (dulaglutide) Ozempic, Wegovy (semaglutide) Mounjaro (tirzepatide) Bydureon Bcise (exanatide extended release)  DO NOT TAKE 1 DAY PRIOR TO YOUR TEST Rybelsus (semaglutide) Adlyxin (lixisenatide) Victoza (liraglutide) Byetta (exanatide) ___________________________________________________________________________   _______________________________________________________  If your blood pressure at your visit was 140/90 or greater, please contact your primary care physician to follow up on this.  _______________________________________________________  If you are age 49 or older, your body mass index should be between 23-30. Your Body mass index is 26.78 kg/m. If this is out of the aforementioned range listed, please consider follow up with your Primary Care Provider.  If you are age 4 or younger, your body mass index should be between 19-25. Your Body mass index is 26.78 kg/m. If this is out of the aformentioned range listed, please consider follow up with your Primary Care Provider.   ________________________________________________________  The [facility] providers would like to encourage you to use [facility] to communicate with providers for non-urgent requests or questions.  Due to long hold times on the telephone, sending your provider a message by [facility] may be a faster and more efficient way to get a response.  Please  allow 48 business hours for a response.  Please remember that this is for non-urgent requests.  _______________________________________________________   Due to recent changes in healthcare laws, you may see the results of your imaging and laboratory studies on MyChart before your provider has had a chance to review them.  We understand that in some cases there may be results that are confusing or concerning to you. Not all laboratory results come back in the same time frame and the provider may be waiting for multiple results in order to interpret others.  Please give [place] 48 hours in order for your provider to thoroughly review all the results before contacting the office for clarification of your results.    Thank you for entrusting me with your care and for choosing [facility], Dr. Eulah Pont

## 2023-02-24 LAB — IBC + FERRITIN
Ferritin: 11.9 ng/mL (ref 10.0–291.0)
Iron: 60 ug/dL (ref 42–145)
Saturation Ratios: 11.9 % — ABNORMAL LOW (ref 20.0–50.0)
TIBC: 502.6 ug/dL — ABNORMAL HIGH (ref 250.0–450.0)
Transferrin: 359 mg/dL (ref 212.0–360.0)

## 2023-03-19 ENCOUNTER — Ambulatory Visit: Payer: Managed Care, Other (non HMO) | Admitting: Adult Health

## 2023-03-19 DIAGNOSIS — G43719 Chronic migraine without aura, intractable, without status migrainosus: Secondary | ICD-10-CM | POA: Diagnosis not present

## 2023-03-19 MED ORDER — ONABOTULINUMTOXINA 200 UNITS IJ SOLR
155.0000 [IU] | Freq: Once | INTRAMUSCULAR | Status: AC
  Administered 2023-03-19: 155 [IU] via INTRAMUSCULAR

## 2023-03-19 NOTE — Progress Notes (Signed)
    03/19/23: First botox injections. Reviewed the procedure and potential side effects with the patient.   BOTOX PROCEDURE NOTE FOR MIGRAINE HEADACHE    Contraindications and precautions discussed with patient(above). Aseptic procedure was observed and patient tolerated procedure. Procedure performed by Butch Penny, NP  The condition has existed for more than 6 months, and pt does not have a diagnosis of ALS, Myasthenia Gravis or Lambert-Eaton Syndrome.  Risks and benefits of injections discussed and pt agrees to proceed with the procedure.  Written consent obtained  These injections are medically necessary. These injections do not cause sedations or hallucinations which the oral therapies may cause.  Indication/Diagnosis: chronic migraine BOTOX(J0585) injection was performed according to protocol by [employer]. 200 units of BOTOX was dissolved into 4 cc NS.   NDC: [address]-40  Type of toxin: Botox  Botox- 200 units x 1 vial Lot: [id number] Expiration: 07/2025 NDC: 8295-6213-08   Bacteriostatic 0.9% Sodium Chloride- 4 mL  Lot: [id number] Expiration: 11/10/2023 NDC: 9629-5284-13   Dx: K44.010   Description of procedure:  The patient was placed in a sitting position. The standard protocol was used for Botox as follows, with 5 units of Botox injected at each site:   -Procerus muscle, midline injection  -Corrugator muscle, bilateral injection  -Frontalis muscle, bilateral injection, with 2 sites each side, medial injection was performed in the upper one third of the frontalis muscle, in the region vertical from the medial inferior edge of the superior orbital rim. The lateral injection was again in the upper one third of the forehead vertically above the lateral limbus of the cornea, 1.5 cm lateral to the medial injection site.  -Temporalis muscle injection, 4 sites, bilaterally. The first injection was 3 cm above the tragus of the ear, second injection site was 1.5 cm to 3 cm up  from the first injection site in line with the tragus of the ear. The third injection site was 1.5-3 cm forward between the first 2 injection sites. The fourth injection site was 1.5 cm posterior to the second injection site.  -Occipitalis muscle injection, 3 sites, bilaterally. The first injection was done one half way between the occipital protuberance and the tip of the mastoid process behind the ear. The second injection site was done lateral and superior to the first, 1 fingerbreadth from the first injection. The third injection site was 1 fingerbreadth superiorly and medially from the first injection site.  -Cervical paraspinal muscle injection, 2 sites, bilateral knee first injection site was 1 cm from the midline of the cervical spine, 3 cm inferior to the lower border of the occipital protuberance. The second injection site was 1.5 cm superiorly and laterally to the first injection site.  -Trapezius muscle injection was performed at 3 sites, bilaterally. The first injection site was in the upper trapezius muscle halfway between the inflection point of the neck, and the acromion. The second injection site was one half way between the acromion and the first injection site. The third injection was done between the first injection site and the inflection point of the neck.   Will return for repeat injection in 3 months.   A 200 unit sof Botox was used, 155 units were injected, the rest of the Botox was wasted. The patient tolerated the procedure well, there were no complications of the above procedure.  Butch Penny, MSN, NP-C 03/19/2023, 8:02 AM [facility] [address], [address] [place], [place] [address] [phone]

## 2023-03-19 NOTE — Progress Notes (Signed)
Botox consent signed  Botox- 200 units x 1 vial Lot: [id number] Expiration: 07/2025 NDC: 7829-5621-30  Bacteriostatic 0.9% Sodium Chloride- 4 mL  Lot: [id number] Expiration: 11/10/2023 NDC: 6962-9528-41  Dx: L24.401 S/P  Witnessed by Randa Evens CMA

## 2023-03-26 ENCOUNTER — Other Ambulatory Visit: Payer: Self-pay | Admitting: Orthopaedic Surgery

## 2023-03-26 DIAGNOSIS — M25552 Pain in left hip: Secondary | ICD-10-CM

## 2023-04-06 ENCOUNTER — Ambulatory Visit (AMBULATORY_SURGERY_CENTER): Payer: Managed Care, Other (non HMO) | Admitting: Internal Medicine

## 2023-04-06 ENCOUNTER — Encounter: Payer: Self-pay | Admitting: Internal Medicine

## 2023-04-06 VITALS — BP 113/73 | HR 63 | Temp 97.5°F | Resp 13 | Ht 64.0 in | Wt 156.0 lb

## 2023-04-06 DIAGNOSIS — K2289 Other specified disease of esophagus: Secondary | ICD-10-CM

## 2023-04-06 DIAGNOSIS — K317 Polyp of stomach and duodenum: Secondary | ICD-10-CM

## 2023-04-06 DIAGNOSIS — K2100 Gastro-esophageal reflux disease with esophagitis, without bleeding: Secondary | ICD-10-CM | POA: Diagnosis present

## 2023-04-06 DIAGNOSIS — K2990 Gastroduodenitis, unspecified, without bleeding: Secondary | ICD-10-CM

## 2023-04-06 DIAGNOSIS — K219 Gastro-esophageal reflux disease without esophagitis: Secondary | ICD-10-CM | POA: Diagnosis present

## 2023-04-06 MED ORDER — SODIUM CHLORIDE 0.9 % IV SOLN: 500.0000 mL | Freq: Once | INTRAVENOUS | Status: DC

## 2023-04-06 NOTE — Progress Notes (Signed)
Educations was given in detail regarding the Bravo. Good feedback noted.

## 2023-04-06 NOTE — Progress Notes (Signed)
GASTROENTEROLOGY PROCEDURE H&P NOTE   Primary Care Physician: Julianne Handler, NP    Reason for Procedure:   Refractory GERD, dysphagia  Plan:    EGD  Patient is appropriate for endoscopic procedure(s) in the ambulatory ([facility]) setting.  The nature of the procedure, as well as the risks, benefits, and alternatives were carefully and thoroughly reviewed with the patient. Ample time for discussion and questions allowed. The patient understood, was satisfied, and agreed to proceed.     HPI: Anna Wilson is a 52 y.o. female who presents for EGD for evaluation of refractory GERD and dysphagia .  Patient was most recently seen in the Gastroenterology Clinic on 02/23/23.  No interval change in medical history since that appointment. Please refer to that note for full details regarding GI history and clinical presentation.   Past Medical History:  Diagnosis Date   Anemia    Ankylosing spondylitis (HCC)    rhemotology--- dr Dierdre Forth,  treated with humira injection   Anxiety    Carpal tunnel syndrome, bilateral    DDD (degenerative disc disease), lumbosacral    Frequency of urination    GERD (gastroesophageal reflux disease)    History of 2019 novel coronavirus disease (COVID-19) 03/24/2020   per pt had mild symptoms that resolved in 10 days and lingering cough resolved few weeks later   History of chest pain    in epic was referred to cardiology, dr Eden Emms (note 01-28-2017) by pt's pcp;  previous nuclear stress test negative for ischemia ef 69% (results in care everywhere 12-22-2016) and normal echo results 08-13-2014 in care everywhere;   pt had cardiac cath @[facility]  03-17-2017 (in epic) no angiographic evidence of CAD and LVEF 65%, non-cardiac chest pain   History of esophageal spasm    per pt hx chest pain from to no be non-cardiac but esophageal spasm and takes protonix which pt stated no spasm's taking protonix   History of kidney stones    History of peptic ulcer 2016   History of  recurrent UTIs    Hyperlipidemia    Hypertension    followed by pcp   Migraines    followed by pcp   OA (osteoarthritis)    Pneumonia    Renal calculus, left    Thoracic outlet syndrome    Wears glasses     Past Surgical History:  Procedure Laterality Date   ABDOMINOPLASTY     ANTERIOR CRUCIATE LIGAMENT (ACL) REVISION Left 03-09-2018  @[facility]    arthroscopy and quadriceps tendon graft   BACK SURGERY  2023   capel tunnel Bilateral 2022   wrist   CHOLECYSTECTOMY OPEN  2009   AND UMBILICIAL HERNIA REPAIR AND ABDOMINOPLASTY   CYSTOSCOPY W/ URETERAL STENT PLACEMENT Left 05/31/2020   Procedure: CYSTOSCOPY WITH RETROGRADE PYELOGRAM/URETERAL STENT PLACEMENT;  Surgeon: Jannifer Hick, MD;  Location: [facility];  Service: Urology;  Laterality: Left;   CYSTOSCOPY/RETROGRADE/URETEROSCOPY/STONE EXTRACTION WITH BASKET Right 02/25/2020   Procedure: CYSTOSCOPY RETROGRADE/ URETEROSCOPY/STONE EXTRACTION WITH BASKET/URETERAL STENT PLACEMENT ;  Surgeon: Malen Gauze, MD;  Location: [facility];  Service: Urology;  Laterality: Right;   CYSTOSCOPY/RETROGRADE/URETEROSCOPY/STONE EXTRACTION WITH BASKET  2012   CYSTOSCOPY/RETROGRADE/URETEROSCOPY/STONE EXTRACTION WITH BASKET Left 06/11/2020   Procedure: CYSTOSCOPY/RETROGRADE/URETEROSCOPY/ LASER LITHOTRIPSY/ STONE EXTRACTION WITH BASKET/ LEFT STENT EXCHANGE;  Surgeon: Jannifer Hick, MD;  Location: [facility];  Service: Urology;  Laterality: Left;  ONLY NEEDS 45 MIN   CYSTOSCOPY/URETEROSCOPY/HOLMIUM LASER/STENT PLACEMENT Bilateral 06/21/2021   Procedure: CYSTOSCOPY/RETROGRADE/URETEROSCOPY/HOLMIUM LASER/STENT PLACEMENT;  Surgeon: Jannifer Hick,  MD;  Location: [facility];  Service: Urology;  Laterality: Bilateral;  ONLY NEEDS 60 MIN   HERNIA REPAIR     KNEE ARTHROSCOPY W/ ACL RECONSTRUCTION Left 2002   KNEE ARTHROSCOPY W/ SYNOVECTOMY Left 07-15-2018  @[facility]    lysis adhesions   KNEE SURGERY Left 2004 and 2017   revision scar tissue   LEFT HEART CATH  AND CORONARY ANGIOGRAPHY N/A 03/17/2017   Procedure: LEFT HEART CATH AND CORONARY ANGIOGRAPHY;  Surgeon: Kathleene Hazel, MD;  Location: [facility];  Service: Cardiovascular;  Laterality: N/A;   LUMBAR SPINE SURGERY  07-15-2019  @[facility]    L4--5 disectomy decompression;  L5--S1 laminectomy   PELVIC LAPAROSCOPY  1992   for endometriosis   POSTERIOR LUMBAR FUSION  12-20-2019  @[facility]    L4--5 revision laminectomy and fusion   SHOULDER ARTHROSCOPY Bilateral 2022   TOTAL LAPAROSCOPIC HYSTERECTOMY WITH BILATERAL SALPINGO OOPHORECTOMY  02/26/2016    Prior to Admission medications   Medication Sig Start Date End Date Taking? Authorizing Provider  amLODipine-valsartan (EXFORGE) 10-160 MG tablet Take 1 tablet by mouth daily. 12/27/20   [provider]  botulinum toxin Type A (BOTOX) 200 units injection Provider to inject 155 units into the muscles of the head and neck every 12 weeks. Discard remainder. 02/05/23   Butch Penny, NP  Coenzyme Q10 (COQ-10) 100 MG capsule Take 100 mg by mouth daily.    [provider]  docusate sodium (COLACE) 100 MG capsule Take 100 mg by mouth daily.    [provider]  DULoxetine (CYMBALTA) 60 MG capsule Take 60 mg by mouth daily. 01/22/20   [provider]  escitalopram (LEXAPRO) 20 MG tablet Take 10 mg by mouth daily. 04/27/20   [provider]  HUMIRA PEN 40 MG/0.4ML PNKT Inject 40 mg into the skin every 14 (fourteen) days. 02/05/21   [provider]  Multiple Vitamin (MULTIVITAMIN ADULT PO) Take 1 tablet by mouth at bedtime.     [provider]  Omega-3 1000 MG CAPS Take 1,000 mg by mouth at bedtime.     [provider]  ondansetron (ZOFRAN) 4 MG tablet Take 1 tablet (4 mg total) by mouth every 8 (eight) hours as needed for nausea or vomiting (nausea/vomiting). 12/01/20   Sheikh, Omair Latif, DO  OVER THE COUNTER MEDICATION Taking K-2 MK- plus D3 5000 units daily Magnesium Glycinate 665  mg one tab daily.    [provider]  pantoprazole (PROTONIX) 40 MG tablet Take 40 mg by mouth 2 (two) times daily.    [provider]  pravastatin (PRAVACHOL) 20 MG tablet Take 20 mg by mouth daily.    [provider]  pregabalin (LYRICA) 150 MG capsule Take 150 mg by mouth 2 (two) times daily.    [provider]  Probiotic Product (PROBIOTIC DAILY PO) Take 1 capsule by mouth daily. 62 billion    [provider]  Red Yeast Rice Extract 600 MG CAPS Take 600 mg by mouth at bedtime.    [provider]  Rimegepant Sulfate (NURTEC) 75 MG TBDP Take 1 tablet at the onset of migraine. Only 1 tab in 24 hours. 01/19/23   Butch Penny, NP  tiZANidine (ZANAFLEX) 4 MG capsule Take 4 mg by mouth 3 (three) times daily as needed for muscle spasms.    [provider]  Turmeric (QC TUMERIC COMPLEX PO) Take 1 capsule by mouth daily.    [provider]  UNABLE TO FIND OTC Cranberry Supplement 1500mg  daily  [provider]    Current Outpatient Medications  Medication Sig Dispense Refill   amLODipine-valsartan (EXFORGE) 10-160 MG tablet Take 1 tablet by mouth daily.     botulinum toxin Type A (BOTOX) 200 units injection Provider to inject 155 units into the muscles of the head and neck every 12 weeks. Discard remainder. 1 each 3   Coenzyme Q10 (COQ-10) 100 MG capsule Take 100 mg by mouth daily.     docusate sodium (COLACE) 100 MG capsule Take 100 mg by mouth daily.     DULoxetine (CYMBALTA) 60 MG capsule Take 60 mg by mouth daily.     escitalopram (LEXAPRO) 20 MG tablet Take 10 mg by mouth daily.     HUMIRA PEN 40 MG/0.4ML PNKT Inject 40 mg into the skin every 14 (fourteen) days.     Multiple Vitamin (MULTIVITAMIN ADULT PO) Take 1 tablet by mouth at bedtime.      Omega-3 1000 MG CAPS Take 1,000 mg by mouth at bedtime.      ondansetron (ZOFRAN) 4 MG tablet Take 1 tablet (4 mg total) by mouth every 8 (eight) hours as needed for  nausea or vomiting (nausea/vomiting). 20 tablet 0   OVER THE COUNTER MEDICATION Taking K-2 MK- plus D3 5000 units daily Magnesium Glycinate 665 mg one tab daily.     pantoprazole (PROTONIX) 40 MG tablet Take 40 mg by mouth 2 (two) times daily.     pravastatin (PRAVACHOL) 20 MG tablet Take 20 mg by mouth daily.     pregabalin (LYRICA) 150 MG capsule Take 150 mg by mouth 2 (two) times daily.     Probiotic Product (PROBIOTIC DAILY PO) Take 1 capsule by mouth daily. 62 billion     Red Yeast Rice Extract 600 MG CAPS Take 600 mg by mouth at bedtime.     Rimegepant Sulfate (NURTEC) 75 MG TBDP Take 1 tablet at the onset of migraine. Only 1 tab in 24 hours. 10 tablet 5   tiZANidine (ZANAFLEX) 4 MG capsule Take 4 mg by mouth 3 (three) times daily as needed for muscle spasms.     Turmeric (QC TUMERIC COMPLEX PO) Take 1 capsule by mouth daily.     UNABLE TO FIND OTC Cranberry Supplement 1500mg  daily     No current facility-administered medications for this visit.    Allergies as of 04/06/2023 - Review Complete 04/06/2023  Allergen Reaction Noted   Nsaids Other (See Comments) 06/01/2016   Sulfa antibiotics Other (See Comments) 06/01/2016   Sulfasalazine Other (See Comments) 06/01/2016    Family History  Problem Relation Age of Onset   Hypertension Mother    Hypertension Father    Hypothyroidism Brother    Migraines Brother    Hypothyroidism Brother    Migraines Brother    Rheum arthritis Daughter    Asthma Daughter    Asthma Son    Migraines Niece    Liver disease Neg Hx    Esophageal cancer Neg Hx    Colon cancer Neg Hx     Social History   Socioeconomic History   Marital status: Married    Spouse name: david   Number of children: 3   Years of education: Not on file   Highest education level: Associate degree: occupational, Scientist, product/process development, or vocational program  Occupational History   Occupation: disable  Tobacco Use   Smoking status: Former    Current packs/day: 0.00    Types:  Cigarettes    Start date: 06/07/1988    Quit  date: 06/07/1994    Years since quitting: 28.8   Smokeless tobacco: Never  Vaping Use   Vaping status: Never Used  Substance and Sexual Activity   Alcohol use: Yes    Comment: occasional   Drug use: No   Sexual activity: Not on file  Other Topics Concern   Not on file  Social History Narrative   Lives with husband and a great nephew (3 y.o.) they have custody of, and a yorkie    Right handed   Caffeine: 1 cup of coffee a day   Social Determinants of Health   Financial Resource Strain: Not on file  Food Insecurity: Not on file  Transportation Needs: Not on file  Physical Activity: Not on file  Stress: Not on file  Social Connections: Unknown (12/22/2021)   [facility]   Social Network    Social Network: Not on file  Intimate Partner Violence: Unknown (11/13/2021)   Received from [facility]   HITS    Physically Hurt: Not on file    Insult or Talk Down To: Not on file    Threaten Physical Harm: Not on file    Scream or Curse: Not on file    Physical Exam: Vital signs in last 24 hours: BP 125/88   Pulse 60   Temp (!) 97.5 F (36.4 C)   Ht 5\' 4"  (1.626 m)   Wt 156 lb (70.8 kg)   LMP  (LMP Unknown)   SpO2 100%   BMI 26.78 kg/m  GEN: NAD EYE: Sclerae anicteric ENT: MMM CV: Non-tachycardic Pulm: No increased WOB GI: Soft NEURO:  Alert & Oriented   Eulah Pont, MD [facility]   04/06/2023 9:47 AM

## 2023-04-06 NOTE — Progress Notes (Signed)
Capsule expiration date- 07/06/24  Capsule ID number [id number]  LES measurement: 37 (capsule placed 6 cm above LES) 31  Time of implant: 1049

## 2023-04-06 NOTE — Progress Notes (Signed)
Called to room to assist during endoscopic procedure.  Patient ID and intended procedure confirmed with present staff. Received instructions for my participation in the procedure from the performing physician.  

## 2023-04-06 NOTE — Progress Notes (Signed)
Pt resting comfortably. VSS. Airway intact. SBAR complete to RN. All questions answered.   

## 2023-04-06 NOTE — Patient Instructions (Signed)
Keep your Bravo within 3 ft of you at all times.  Follow the directions given to you by the RN's. You also have the full instructions in your packet.  If you have questions, call us.  Bring the unit back on August 28 th by 3 pm.  Do not take antacids during the 48 hour monitoring time.  YOU HAD AN ENDOSCOPIC PROCEDURE TODAY AT THE  ENDOSCOPY CENTER:   Refer to the procedure report that was given to you for any specific questions about what was found during the examination.  If the procedure report does not answer your questions, please call your gastroenterologist to clarify.  If you requested that your care partner not be given the details of your procedure findings, then the procedure report has been included in a sealed envelope for you to review at your convenience later.  YOU SHOULD EXPECT: Some feelings of bloating in the abdomen. Passage of more gas than usual.  Please Note:  You might notice some irritation and congestion in your nose or some drainage.  This is from the oxygen used during your procedure.  There is no need for concern and it should clear up in a day or so.  SYMPTOMS TO REPORT IMMEDIATELY:  Following upper endoscopy (EGD)  Vomiting of blood or coffee ground material  New chest pain or pain under the shoulder blades  Painful or persistently difficult swallowing  New shortness of breath  Fever of 100F or higher  Black, tarry-looking stools  For urgent or emergent issues, a gastroenterologist can be reached at any hour by calling [phone]. Do not use MyChart messaging for urgent concerns.    DIET:  We do recommend a small meal at first, but then you may proceed to your regular diet.  Drink plenty of fluids but you should avoid alcoholic beverages for 24 hours  ACTIVITY:  You should plan to take it easy for the rest of today and you should NOT DRIVE or use heavy machinery until tomorrow (because of the sedation medicines used during the test).    FOLLOW  UP: Our staff will call the number listed on your records the next business day following your procedure.  We will call around 7:15- 8:00 am to check on you and address any questions or concerns that you may have regarding the information given to you following your procedure. If we do not reach you, we will leave a message.     If any biopsies were taken you will be contacted by phone or by letter within the next 1-3 weeks.  Please call us at [phone] if you have not heard about the biopsies in 3 weeks.    SIGNATURES/CONFIDENTIALITY: You and/or your care partner have signed paperwork which will be entered into your electronic medical record.  These signatures attest to the fact that that the information above on your After Visit Summary has been reviewed and is understood.  Full responsibility of the confidentiality of this discharge information lies with you and/or your care-partner.

## 2023-04-06 NOTE — Op Note (Signed)
[facility] Patient Name: Anna Wilson Procedure Date: 04/06/2023 10:31 AM MRN: [id number] Endoscopist: Madelyn Brunner [place] , , [id number] Age: 52 Referring MD:  Date of Birth: 06/10/1971 Gender: Female Account #: [id number] Procedure:                Upper GI endoscopy Indications:              Dysphagia, Heartburn Medicines:                Monitored Anesthesia Care Procedure:                Pre-Anesthesia Assessment:                           - Prior to the procedure, a History and Physical                            was performed, and patient medications and                            allergies were reviewed. The patient's tolerance of                            previous anesthesia was also reviewed. The risks                            and benefits of the procedure and the sedation                            options and risks were discussed with the patient.                            All questions were answered, and informed consent                            was obtained. Prior Anticoagulants: The patient has                            taken no anticoagulant or antiplatelet agents. ASA                            Grade Assessment: II - A patient with mild systemic                            disease. After reviewing the risks and benefits,                            the patient was deemed in satisfactory condition to                            undergo the procedure.                           After obtaining informed consent, the endoscope was  passed under direct vision. Throughout the                            procedure, the patient's blood pressure, pulse, and                            oxygen saturations were monitored continuously. The                            Olympus Scope [id number] was introduced through the                            mouth, and advanced to the second part of duodenum.                            The upper GI endoscopy was  accomplished without                            difficulty. The patient tolerated the procedure                            well. Scope In: Scope Out: Findings:                 The examined esophagus was normal. Biopsies were                            taken with a cold forceps for histology. The BRAVO                            capsule with delivery system was introduced through                            the mouth and advanced into the esophagus, such                            that the BRAVO pH capsule was positioned 31 cm from                            the incisors, which was 6 cm proximal to the GE                            junction. The BRAVO pH capsule was then deployed                            and attached to the esophageal mucosa. The delivery                            system was then withdrawn. Endoscopy was utilized                            for probe placement and diagnostic evaluation.  Three 5 to 10 mm sessile polyps with no bleeding                            and no stigmata of recent bleeding were found in                            the gastric fundus and in the gastric body. These                            polyps were removed with a cold snare. Resection                            and retrieval were complete.                           Localized mild inflammation characterized by                            congestion (edema) and erythema was found in the                            gastric antrum. Biopsies were taken with a cold                            forceps for histology.                           The examined duodenum was normal. Complications:            No immediate complications. Estimated Blood Loss:     Estimated blood loss was minimal. Impression:               - Normal esophagus. Biopsied.                           - Three gastric polyps. Resected and retrieved.                           - Gastritis. Biopsied.                            - Normal examined duodenum.                           - The BRAVO pH capsule was deployed. Recommendation:           - Discharge patient to home (with escort).                           - Await pathology results.                           - Await results of BRAVO pH capsule.                           - Return to GI clinic in 2-3 months.                           -  The findings and recommendations were discussed                            with the patient. Dr Particia Lather "[place]" [place],  04/06/2023 10:56:19 AM

## 2023-04-07 ENCOUNTER — Telehealth: Payer: Self-pay

## 2023-04-07 NOTE — Telephone Encounter (Signed)
  Follow up Call-     04/06/2023    9:38 AM  Call back number  Post procedure Call Back phone  # [phone]  Permission to leave phone message Yes     Patient questions:  Do you have a fever, pain , or abdominal swelling? No. Pain Score  0 *  Have you tolerated food without any problems? Yes.    Have you been able to return to your normal activities? Yes.    Do you have any questions about your discharge instructions: Diet   No. Medications  No. Follow up visit  No.  Do you have questions or concerns about your Care? No.  Actions: * If pain score is 4 or above: No action needed, pain <4.

## 2023-04-08 ENCOUNTER — Encounter: Payer: Self-pay | Admitting: Internal Medicine

## 2023-05-01 ENCOUNTER — Encounter: Payer: Self-pay | Admitting: Internal Medicine

## 2023-05-05 ENCOUNTER — Other Ambulatory Visit: Payer: Self-pay | Admitting: Internal Medicine

## 2023-05-05 ENCOUNTER — Telehealth: Payer: Self-pay

## 2023-05-05 ENCOUNTER — Encounter: Payer: Self-pay | Admitting: Internal Medicine

## 2023-05-05 DIAGNOSIS — K219 Gastro-esophageal reflux disease without esophagitis: Secondary | ICD-10-CM

## 2023-05-05 MED ORDER — DEXLANSOPRAZOLE 60 MG PO CPDR: 60.0000 mg | DELAYED_RELEASE_CAPSULE | Freq: Every day | ORAL | 1 refills | Status: DC

## 2023-05-05 NOTE — Telephone Encounter (Signed)
Dexilant 60 mg has been prescribed. Will this require a prior authorization?

## 2023-05-05 NOTE — Telephone Encounter (Signed)
Called and spoke to the patient.  I apologized for the BRAVO results taking so long to be relayed to her.  I could see that I had interpreted the BRAVO report on 8/28 and had given a copy of the report to my staff to get scanned into the electronic medical records (EMR). Unfortunately for unclear reasons the report was never formally scanned into the EMR. I have given another copy of the report to my staff to get scanned into the EMR system.  In the meantime I informed the patient that her Bravo test shows that she has true gastroesophageal reflux disease.  Her total acid exposure time was 6.3%, which is significantly elevated.  Her regurgitation symptom correlated with true acid reflux episodes as well.  I offered the patient the option of trying an alternative antireflux medication versus proceeding to an antireflux surgery referral.  Patient would be interested in trying another reflux medication.  She has tried every over-the-counter acid reflux medication that is available and has also been tried on prescription pantoprazole at 40 mg BID.  Patient is interested in trying another reflux medication.  I recommended that she be placed on Dexilant 60 mg once daily, pending insurance approval.  Beth, I will place the order for the Dexilant.  Please start the prior authorization process for this medication.

## 2023-05-06 ENCOUNTER — Inpatient Hospital Stay: Admission: RE | Admit: 2023-05-06 | Payer: Managed Care, Other (non HMO) | Source: Ambulatory Visit

## 2023-05-06 ENCOUNTER — Other Ambulatory Visit: Payer: Managed Care, Other (non HMO)

## 2023-05-06 ENCOUNTER — Telehealth: Payer: Self-pay | Admitting: Pharmacy Technician

## 2023-05-06 ENCOUNTER — Other Ambulatory Visit (HOSPITAL_COMMUNITY): Payer: Self-pay

## 2023-05-06 NOTE — Telephone Encounter (Signed)
Pharmacy Patient Advocate Encounter   Received notification from Pt Calls Messages that prior authorization for DEXLANSOPRAZOLE 60MG  is required/requested.   Insurance verification completed.   The patient is insured through [employer] [facility] .   Per test claim: PA required; PA submitted to [employer] [facility] via CoverMyMeds Key/confirmation #/EOC [id number] Status is pending

## 2023-05-06 NOTE — Telephone Encounter (Signed)
Pharmacy Patient Advocate Encounter  Received notification from [employer] [facility] that Prior Authorization for DEXLANSOPRAZOLE 60MG  has been APPROVED from 9.25.24 to 9.25.25. Ran test claim, Copay is $10. This test claim was processed through [facility]- copay amounts may vary at other pharmacies due to pharmacy/plan contracts, or as the patient moves through the different stages of their insurance plan.   PA #/Case ID/Reference #:  [id number]

## 2023-05-06 NOTE — Telephone Encounter (Signed)
PA is required. PA has been submitted, and telephone encounter has been created.'

## 2023-05-18 ENCOUNTER — Encounter: Payer: Self-pay | Admitting: Orthopaedic Surgery

## 2023-05-21 ENCOUNTER — Other Ambulatory Visit: Payer: Managed Care, Other (non HMO)

## 2023-05-21 ENCOUNTER — Inpatient Hospital Stay: Admission: RE | Admit: 2023-05-21 | Payer: Managed Care, Other (non HMO) | Source: Ambulatory Visit

## 2023-06-15 ENCOUNTER — Ambulatory Visit: Payer: Managed Care, Other (non HMO) | Admitting: Adult Health

## 2023-06-15 DIAGNOSIS — G43719 Chronic migraine without aura, intractable, without status migrainosus: Secondary | ICD-10-CM

## 2023-06-15 MED ORDER — ONABOTULINUMTOXINA 200 UNITS IJ SOLR
155.0000 [IU] | Freq: Once | INTRAMUSCULAR | Status: AC
  Administered 2023-06-15: 155 [IU] via INTRAMUSCULAR

## 2023-06-15 NOTE — Progress Notes (Signed)
06/15/23: Botox has helped with the severity of her headaches. Not sure how much nurtec has helped.  03/19/23: First botox injections. Reviewed the procedure and potential side effects with the patient.   BOTOX PROCEDURE NOTE FOR MIGRAINE HEADACHE    Contraindications and precautions discussed with patient(above). Aseptic procedure was observed and patient tolerated procedure. Procedure performed by Butch Penny, NP  The condition has existed for more than 6 months, and pt does not have a diagnosis of ALS, Myasthenia Gravis or Lambert-Eaton Syndrome.  Risks and benefits of injections discussed and pt agrees to proceed with the procedure.  Written consent obtained  These injections are medically necessary. These injections do not cause sedations or hallucinations which the oral therapies may cause.  Indication/Diagnosis: chronic migraine BOTOX(J0585) injection was performed according to protocol by [employer]. 200 units of BOTOX was dissolved into 4 cc NS.   NDC: [address]-60  Type of toxin: Botox  Botox- 200 units x 1 vial Lot: [id number] Expiration: 09/2025  NDC: 7322-0254-27   Bacteriostatic 0.9% Sodium Chloride- 4 mL  Lot: [id number] Expiration: 11/10/2023 NDC: 2831-5176-16 Dx: W73.710     Description of procedure:  The patient was placed in a sitting position. The standard protocol was used for Botox as follows, with 5 units of Botox injected at each site:   -Procerus muscle, midline injection  -Corrugator muscle, bilateral injection  -Frontalis muscle, bilateral injection, with 2 sites each side, medial injection was performed in the upper one third of the frontalis muscle, in the region vertical from the medial inferior edge of the superior orbital rim. The lateral injection was again in the upper one third of the forehead vertically above the lateral limbus of the cornea, 1.5 cm lateral to the medial injection site.  -Temporalis muscle injection, 4 sites, bilaterally.  The first injection was 3 cm above the tragus of the ear, second injection site was 1.5 cm to 3 cm up from the first injection site in line with the tragus of the ear. The third injection site was 1.5-3 cm forward between the first 2 injection sites. The fourth injection site was 1.5 cm posterior to the second injection site.  -Occipitalis muscle injection, 3 sites, bilaterally. The first injection was done one half way between the occipital protuberance and the tip of the mastoid process behind the ear. The second injection site was done lateral and superior to the first, 1 fingerbreadth from the first injection. The third injection site was 1 fingerbreadth superiorly and medially from the first injection site.  -Cervical paraspinal muscle injection, 2 sites, bilateral knee first injection site was 1 cm from the midline of the cervical spine, 3 cm inferior to the lower border of the occipital protuberance. The second injection site was 1.5 cm superiorly and laterally to the first injection site.  -Trapezius muscle injection was performed at 3 sites, bilaterally. The first injection site was in the upper trapezius muscle halfway between the inflection point of the neck, and the acromion. The second injection site was one half way between the acromion and the first injection site. The third injection was done between the first injection site and the inflection point of the neck.   Will return for repeat injection in 3 months.   A 200 unit sof Botox was used, 155 units were injected, the rest of the Botox was wasted. The patient tolerated the procedure well, there were no complications of the above procedure.  Butch Penny, MSN, NP-C 06/15/2023, 3:08 PM [facility]  [address], [address] [place], [place] [address] [phone]

## 2023-06-15 NOTE — Progress Notes (Signed)
Botox- 200 units x 1 vial Lot: [id number] Expiration: 09/2025  NDC: 4401-0272-53  Bacteriostatic 0.9% Sodium Chloride- 4 mL  Lot: [id number] Expiration: 11/10/2023 NDC: 4742-5956-38 Dx: g43.719 S/P  Witnessed by Delmer Islam

## 2023-06-19 ENCOUNTER — Encounter: Payer: Self-pay | Admitting: Internal Medicine

## 2023-06-20 ENCOUNTER — Other Ambulatory Visit: Payer: Self-pay | Admitting: Adult Health

## 2023-06-23 NOTE — Telephone Encounter (Signed)
Rx refilled.

## 2023-08-18 ENCOUNTER — Encounter: Payer: Self-pay | Admitting: Internal Medicine

## 2023-08-18 ENCOUNTER — Ambulatory Visit: Payer: Managed Care, Other (non HMO) | Admitting: Internal Medicine

## 2023-08-18 VITALS — BP 120/76 | HR 94 | Ht 64.0 in | Wt 149.8 lb

## 2023-08-18 DIAGNOSIS — K219 Gastro-esophageal reflux disease without esophagitis: Secondary | ICD-10-CM

## 2023-08-18 DIAGNOSIS — K200 Eosinophilic esophagitis: Secondary | ICD-10-CM | POA: Diagnosis not present

## 2023-08-18 DIAGNOSIS — K5900 Constipation, unspecified: Secondary | ICD-10-CM

## 2023-08-18 DIAGNOSIS — K625 Hemorrhage of anus and rectum: Secondary | ICD-10-CM

## 2023-08-18 DIAGNOSIS — R1310 Dysphagia, unspecified: Secondary | ICD-10-CM | POA: Diagnosis not present

## 2023-08-18 DIAGNOSIS — Z1211 Encounter for screening for malignant neoplasm of colon: Secondary | ICD-10-CM

## 2023-08-18 MED ORDER — DUPILUMAB 300 MG/2ML ~~LOC~~ SOSY: 300.0000 mg | PREFILLED_SYRINGE | SUBCUTANEOUS | 3 refills | Status: DC

## 2023-08-18 NOTE — Progress Notes (Signed)
 Chief Complaint: GERD  HPI : 53 year old female with history of ankylosing spondylitis on Humira, DDD, fibromyalgia, osteoarthritis, GERD, anxiety, DDD, and migraines presents for follow up of GERD  She has history of GERD since 2015-2016, which has been worsening over time. She saw a GI physician Dr. Towana in [place] in 2017-2018 for which she was diagnosed with esophageal spasms. She has never had an esophageal manometry test in the past. She was originally on nitroglycerin to treat esophageal spasms, but this would cause headaches so she stopped the nitroglycerin. Dysphagia started March 2023 and occurs to solids and liquids. She has had rectal bleeding due to hemorrhoids in the past, and she previously considered hemorrhoidectomy. She has never had a colonoscopy in the past. She had a Cologuard that was negative in 2021. Her recent EGD did show elevated Eos/hpf of 20, which suggests likely EoE and/or GERD  Interval History: Dexilant  has been working well for her. She will have a little bit of reflux at night time. On Dec 6th, she started having really bad esophageal spasms, which went on for 20 min. She was having pain in her jaw at that time. She went to [facility] and was ruled out for a heart attack. She was given a GI cocktail, and the lidocaine  helped reduce her spasm. The spasms have been coming more frequently than it has been in the past. Diet had not really changed. Her weight fluctuates between 146-160, which is where she is at currently. She had her regular annual physical when her labs looked normal. She has had trouble swallowing as well, particularly with pills. She does have chronic issues with constipation, and she has one BM twice a week. Son has several food allergies, and had his esophagus stretched in the past. Patient's son's name is Jalayla Chrismer  Wt Readings from Last 3 Encounters:  08/18/23 149 lb 12.8 oz (67.9 kg)  04/06/23 156 lb (70.8 kg)  02/23/23 156 lb (70.8 kg)   Past  Medical History:  Diagnosis Date   Anemia    Ankylosing spondylitis (HCC)    rhemotology--- dr mai,  treated with humira injection   Anxiety    Carpal tunnel syndrome, bilateral    Cataract    DDD (degenerative disc disease), lumbosacral    Frequency of urination    GERD (gastroesophageal reflux disease)    History of 2019 novel coronavirus disease (COVID-19) 03/24/2020   per pt had mild symptoms that resolved in 10 days and lingering cough resolved few weeks later   History of chest pain    in epic was referred to cardiology, dr delford (note 01-28-2017) by pt's pcp;  previous nuclear stress test negative for ischemia ef 69% (results in care everywhere 12-22-2016) and normal echo results 08-13-2014 in care everywhere;   pt had cardiac cath @[facility]  03-17-2017 (in epic) no angiographic evidence of CAD and LVEF 65%, non-cardiac chest pain   History of esophageal spasm    per pt hx chest pain from to no be non-cardiac but esophageal spasm and takes protonix  which pt stated no spasm's taking protonix    History of kidney stones    History of peptic ulcer 2016   History of recurrent UTIs    Hyperlipidemia    Hypertension    followed by pcp   Migraines    followed by pcp   OA (osteoarthritis)    Pneumonia    Renal calculus, left    Thoracic outlet syndrome    Wears glasses  Past Surgical History:  Procedure Laterality Date   ABDOMINOPLASTY     ANTERIOR CRUCIATE LIGAMENT (ACL) REVISION Left 03-09-2018  @[facility]    arthroscopy and quadriceps tendon graft   BACK SURGERY  2023   capel tunnel Bilateral 2022   wrist   CHOLECYSTECTOMY OPEN  2009   AND UMBILICIAL HERNIA REPAIR AND ABDOMINOPLASTY   CYSTOSCOPY W/ URETERAL STENT PLACEMENT Left 05/31/2020   Procedure: CYSTOSCOPY WITH RETROGRADE PYELOGRAM/URETERAL STENT PLACEMENT;  Surgeon: Selma Donnice SAUNDERS, MD;  Location: [facility];  Service: Urology;  Laterality: Left;   CYSTOSCOPY/RETROGRADE/URETEROSCOPY/STONE EXTRACTION WITH BASKET Right  02/25/2020   Procedure: CYSTOSCOPY RETROGRADE/ URETEROSCOPY/STONE EXTRACTION WITH BASKET/URETERAL STENT PLACEMENT ;  Surgeon: Sherrilee Belvie CROME, MD;  Location: [facility];  Service: Urology;  Laterality: Right;   CYSTOSCOPY/RETROGRADE/URETEROSCOPY/STONE EXTRACTION WITH BASKET  2012   CYSTOSCOPY/RETROGRADE/URETEROSCOPY/STONE EXTRACTION WITH BASKET Left 06/11/2020   Procedure: CYSTOSCOPY/RETROGRADE/URETEROSCOPY/ LASER LITHOTRIPSY/ STONE EXTRACTION WITH BASKET/ LEFT STENT EXCHANGE;  Surgeon: Selma Donnice SAUNDERS, MD;  Location: [facility];  Service: Urology;  Laterality: Left;  ONLY NEEDS 45 MIN   CYSTOSCOPY/URETEROSCOPY/HOLMIUM LASER/STENT PLACEMENT Bilateral 06/21/2021   Procedure: CYSTOSCOPY/RETROGRADE/URETEROSCOPY/HOLMIUM LASER/STENT PLACEMENT;  Surgeon: Selma Donnice SAUNDERS, MD;  Location: [facility];  Service: Urology;  Laterality: Bilateral;  ONLY NEEDS 60 MIN   HERNIA REPAIR     KNEE ARTHROSCOPY W/ ACL RECONSTRUCTION Left 2002   KNEE ARTHROSCOPY W/ SYNOVECTOMY Left 07-15-2018  @[facility]    lysis adhesions   KNEE SURGERY Left 2004 and 2017   revision scar tissue   LEFT HEART CATH AND CORONARY ANGIOGRAPHY N/A 03/17/2017   Procedure: LEFT HEART CATH AND CORONARY ANGIOGRAPHY;  Surgeon: Verlin Lonni BIRCH, MD;  Location: [facility];  Service: Cardiovascular;  Laterality: N/A;   LUMBAR SPINE SURGERY  07-15-2019  @[facility]    L4--5 disectomy decompression;  L5--S1 laminectomy   PELVIC LAPAROSCOPY  1992   for endometriosis   POSTERIOR LUMBAR FUSION  12-20-2019  @[facility]    L4--5 revision laminectomy and fusion   SHOULDER ARTHROSCOPY Bilateral 2022   TOTAL LAPAROSCOPIC HYSTERECTOMY WITH BILATERAL SALPINGO OOPHORECTOMY  02/26/2016   Family History  Problem Relation Age of Onset   Hypertension Mother    Hypertension Father    Hypothyroidism Brother    Migraines Brother    Hypothyroidism Brother    Migraines Brother    Rheum arthritis Daughter    Asthma Daughter    Asthma Son    Migraines  Niece    Liver disease Neg Hx    Esophageal cancer Neg Hx    Colon cancer Neg Hx    Social History   Tobacco Use   Smoking status: Former    Current packs/day: 0.00    Types: Cigarettes    Start date: 06/07/1988    Quit date: 06/07/1994    Years since quitting: 29.2   Smokeless tobacco: Never  Vaping Use   Vaping status: Never Used  Substance Use Topics   Alcohol use: Yes    Comment: occasional   Drug use: No   Current Outpatient Medications  Medication Sig Dispense Refill   amLODipine -valsartan  (EXFORGE ) 10-160 MG tablet Take 1 tablet by mouth daily.     amphetamine-dextroamphetamine (ADDERALL XR) 20 MG 24 hr capsule Take 20 mg by mouth every morning.     botulinum toxin Type A  (BOTOX ) 200 units injection Provider to inject 155 units into the muscles of the head and neck every 12 weeks. Discard remainder. 1 each 3   Coenzyme Q10 (COQ-10) 100 MG capsule Take 100 mg  by mouth daily.     dexlansoprazole  (DEXILANT ) 60 MG capsule Take 1 capsule (60 mg total) by mouth daily. 90 capsule 1   docusate sodium  (COLACE) 100 MG capsule Take 100 mg by mouth daily.     DULoxetine  (CYMBALTA ) 60 MG capsule Take 60 mg by mouth daily.     HUMIRA PEN 40 MG/0.4ML PNKT Inject 40 mg into the skin every 14 (fourteen) days.     Multiple Vitamin (MULTIVITAMIN ADULT PO) Take 1 tablet by mouth at bedtime.      NURTEC 75 MG TBDP TAKE 1 TABLET AT THE ONSET OF MIGRAINE. ONLY 1 TAB IN 24 HOURS. 10 tablet 5   Omega-3 1000 MG CAPS Take 1,000 mg by mouth at bedtime.      ondansetron  (ZOFRAN ) 4 MG tablet Take 1 tablet (4 mg total) by mouth every 8 (eight) hours as needed for nausea or vomiting (nausea/vomiting). 20 tablet 0   OVER THE COUNTER MEDICATION Taking K-2 MK- plus D3 5000 units daily Magnesium  Glycinate 665 mg one tab daily.     pravastatin  (PRAVACHOL ) 20 MG tablet Take 20 mg by mouth daily.     pregabalin (LYRICA) 150 MG capsule Take 150 mg by mouth 2 (two) times daily.     Probiotic Product (PROBIOTIC  DAILY PO) Take 1 capsule by mouth daily. 62 billion     Red Yeast Rice Extract 600 MG CAPS Take 600 mg by mouth at bedtime.     tiZANidine (ZANAFLEX) 4 MG capsule Take 4 mg by mouth 3 (three) times daily as needed for muscle spasms.     Turmeric (QC TUMERIC COMPLEX PO) Take 1 capsule by mouth daily.     UNABLE TO FIND OTC Cranberry Supplement 1500mg  daily     No current facility-administered medications for this visit.   Allergies  Allergen Reactions   Nsaids Other (See Comments)    Ulcers   Sulfa Antibiotics Other (See Comments)    Pt states I bleed from my body orficese when I take sulfa drugs   Sulfasalazine Other (See Comments)    Pt states I bleed from my body orficese when I take sulfa drugs   Physical Exam: BP 120/76   Pulse 94   Ht 5' 4 (1.626 m)   Wt 149 lb 12.8 oz (67.9 kg)   LMP  (LMP Unknown)   BMI 25.71 kg/m  Constitutional: Pleasant,well-developed, female in no acute distress. HEENT: Normocephalic and atraumatic. Conjunctivae are normal. No scleral icterus. Cardiovascular: Normal rate, regular rhythm.  Pulmonary/chest: Effort normal and breath sounds normal. No wheezing, rales or rhonchi. Abdominal: Soft, nondistended, non-tender. Bowel sounds active throughout. There are no masses palpable. No hepatomegaly. Extremities: No edema Neurological: Alert and oriented to person place and time. Skin: Skin is warm and dry. No rashes noted. Psychiatric: Normal mood and affect. Behavior is normal.  Labs 10/2021: CBC with nml Hb of 12.7.  Labs 12/2021: CBC with low Hb of 9.9. CMP unremarkable.  Labs 02/2023: Low iron sat of 11.9%. Nml CBC. Vit B12 and folate nml.   Labs 07/2025: CBC and BMP nml.   CT A/P w/o contrast 11/29/20: IMPRESSION: 1. No acute intra-abdominal process to provide cause for patient's symptoms. 2. Bilateral nonobstructive nephrolithiasis. No obstructive uropathy, hydronephrosis or other acute urinary tract abnormality. 3. Prior  cholecystectomy. 4. L4-5 PLIF without acute hardware complication. 5. Aortic Atherosclerosis (ICD10-I70.0)  CT renal stone study 06/24/21: IMPRESSION: 1. Small-moderate-sized right-sided subcapsular perirenal hematoma. 2. Multiple punctate bilateral renal calculi. Moderate right  and mild left hydroureteronephrosis. No ureteral calculi are identified. No obstructing stone is seen within either ureter.  CT A/P w/contrast 10/21/21: IMPRESSION: 1. No acute intrathoracic pathology. No CT evidence of pulmonary artery embolism. 2. Mild right hydronephrosis. 3. Excreted contrast in the right renal collecting system. Underlying urothelial lesion is not entirely excluded. 4. No bowel obstruction. Normal appendix. 5. Mild fatty liver.  EGD with BRAVO 04/06/23: - Normal esophagus. Biopsied. - Three gastric polyps. Resected and retrieved. - Gastritis. Biopsied. - Normal examined duodenum. - The BRAVO pH capsule was deployed. Path: 1. Surgical [P], gastric antrum and gastric body - GASTRIC ANTRAL AND OXYNTIC MUCOSA WITH FEATURES OF REACTIVE GASTROPATHY - NEGATIVE FOR H. PYLORI ON H&E STAIN - NEGATIVE FOR INTESTINAL METAPLASIA OR MALIGNANCY 2. Surgical [P], gastric polyps, polyp (3) - FUNDIC GLAND POLYPS 3. Surgical [P], esophagus - BENIGN SQUAMOUS MUCOSA WITH INCREASED INTRAEPITHELIAL EOSINOPHILS (UPTO 20/HIGH POWER FIELD). 3. The esophageal biopsy shows squamous mucosa with basal cell hyperplasia, intraepithelial edema, and moderately increased intraepithelial eosinophils (with up to 20 eosinophils per single high-power field). The primary differential diagnosis includes reflux esophagitis and eosinophilic esophagitis. Clinical and endoscopic correlation is recommended. BRAVO:   ASSESSMENT AND PLAN: Refractory GERD EoE Dysphagia Constipation Hemorrhoids Rectal bleeding Colon cancer screening Patient has had some benefit from Dexilant  therapy for treatment of GERD. However she does  still have some breakthrough GERD and also still experiences difficulty with dysphagia. Since patient may also have EoE as a source of her symptoms (she had elevated Eos on her last EGD), I offered her three options for treatment of EoE, including topical steroids, elimination diet, or Dupixent . Patient was interested in Dupixent  therapy since she thinks that she would be most compliant with this. Thus will get her started on Dupixent . About 3 months after she has been on Dupixent , then will plan for an EGD with esophageal biopsies to see if her EoE is responding appropriately. Patient also describes issues with constipation so will start some constipation treatments, which should also help with her hemorrhoids. Since patient is due for colon cancer screening, will plan for a colonoscopy at the same time as her next EGD. - Previously gave GERD handout - Continue Dexilant  therapy - Start Dupixent  300 mg weekly SQ for treatment of EoE - Drink 8 cups of water, walk 30 min daily, and daily fiber supplement - Start Miralax QD - EGD/colonoscopy [facility] with 2 day prep in 3 months  Estefana Kidney, MD  I spent 41 minutes of time, including in depth chart review, independent review of results as outlined above, communicating results with the patient directly, face-to-face time with the patient, coordinating care, ordering studies and medications as appropriate, and documentation.

## 2023-08-18 NOTE — Patient Instructions (Addendum)
 Drink 8 cups of water a day and walk 30 minutes a day.  Please purchase the following medications over the counter and take as directed: Fiber supplement such as Benefiber- use as directed daily Miralax: Take as  directed up to 3 times a day to achieve regular bowel movements  We have sent the following medications to your pharmacy for you to pick up at your convenience: Dupixent   If your blood pressure at your visit was 140/90 or greater, please contact your primary care physician to follow up on this.  _______________________________________________________  If you are age 51 or older, your body mass index should be between 23-30. Your Body mass index is 25.71 kg/m. If this is out of the aforementioned range listed, please consider follow up with your Primary Care Provider.  If you are age 47 or younger, your body mass index should be between 19-25. Your Body mass index is 25.71 kg/m. If this is out of the aformentioned range listed, please consider follow up with your Primary Care Provider.   ________________________________________________________  The [facility] providers would like to encourage you to use MYCHART to communicate with providers for non-urgent requests or questions.  Due to long hold times on the telephone, sending your provider a message by [facility] may be a faster and more efficient way to get a response.  Please allow 48 business hours for a response.  Please remember that this is for non-urgent requests.  _______________________________________________________  Due to recent changes in healthcare laws, you may see the results of your imaging and laboratory studies on MyChart before your provider has had a chance to review them.  We understand that in some cases there may be results that are confusing or concerning to you. Not all laboratory results come back in the same time frame and the provider may be waiting for multiple results in order to interpret others.  Please  give us  48 hours in order for your provider to thoroughly review all the results before contacting the office for clarification of your results.   Thank you for entrusting me with your care and for choosing [facility], Dr. Estefana Kidney

## 2023-09-04 ENCOUNTER — Telehealth: Payer: Self-pay

## 2023-09-04 ENCOUNTER — Other Ambulatory Visit (HOSPITAL_COMMUNITY): Payer: Self-pay

## 2023-09-04 ENCOUNTER — Telehealth: Payer: Self-pay | Admitting: Pharmacy Technician

## 2023-09-04 NOTE — Telephone Encounter (Signed)
Pharmacy Patient Advocate Encounter   Received notification from CoverMyMeds that prior authorization for DUPIXENT 300MG  is required/requested.   Insurance verification completed.   The patient is insured through [employer] [facility] .   Per test claim: PA required; PA started via CoverMyMeds. KEY [id number] . Please see clinical question(s) below that I am not finding the answer to in her chart and advise.

## 2023-09-04 NOTE — Telephone Encounter (Signed)
Dupixent was denied. I do not see where our pharmacy team was involved though. The denial is scanned in under media

## 2023-09-07 NOTE — Telephone Encounter (Signed)
Could we please appeal? It looks like the diagnosis that was submitted for the medication was ankylosing spondylitis, which is not the right diagnosis.. 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

## 2023-09-07 NOTE — Telephone Encounter (Signed)
The 04/06/23 surgical pathology report supports the diagnosis. She has not tried and failed any other treatments.

## 2023-09-10 ENCOUNTER — Other Ambulatory Visit: Payer: Self-pay | Admitting: Internal Medicine

## 2023-09-10 DIAGNOSIS — K219 Gastro-esophageal reflux disease without esophagitis: Secondary | ICD-10-CM

## 2023-09-14 ENCOUNTER — Ambulatory Visit: Payer: Managed Care, Other (non HMO) | Admitting: Adult Health

## 2023-09-14 ENCOUNTER — Encounter: Payer: Self-pay | Admitting: Internal Medicine

## 2023-09-14 DIAGNOSIS — G43719 Chronic migraine without aura, intractable, without status migrainosus: Secondary | ICD-10-CM | POA: Diagnosis not present

## 2023-09-14 MED ORDER — ONABOTULINUMTOXINA 200 UNITS IJ SOLR
155.0000 [IU] | Freq: Once | INTRAMUSCULAR | Status: AC
  Administered 2023-09-14: 155 [IU] via INTRAMUSCULAR

## 2023-09-14 MED ORDER — RIZATRIPTAN BENZOATE 10 MG PO TBDP: ORAL_TABLET | ORAL | 11 refills | Status: DC

## 2023-09-14 NOTE — Progress Notes (Signed)
Botox- 200 units x 1 vial Lot: [id number] Expiration: 11/2025 NDC: 6606-3016-01  Bacteriostatic 0.9% Sodium Chloride- 4 mL  Lot: [id number] Expiration: 06/11/2024 NDC: 5732-2025-42  Dx: H06.237 S/P  Witnessed by Sharrie Rothman

## 2023-09-14 NOTE — Progress Notes (Signed)
09/14/23: Migraines have improved. Less intense. Still have 2-3 migraines a week. She typically can work through these. Uses nurtec for migraines.   06/15/23: Botox has helped with the severity of her headaches. Not sure how much nurtec has helped.  03/19/23: First botox injections. Reviewed the procedure and potential side effects with the patient.   BOTOX PROCEDURE NOTE FOR MIGRAINE HEADACHE    Contraindications and precautions discussed with patient(above). Aseptic procedure was observed and patient tolerated procedure. Procedure performed by Butch Penny, NP  The condition has existed for more than 6 months, and pt does not have a diagnosis of ALS, Myasthenia Gravis or Lambert-Eaton Syndrome.  Risks and benefits of injections discussed and pt agrees to proceed with the procedure.  Written consent obtained  These injections are medically necessary. These injections do not cause sedations or hallucinations which the oral therapies may cause.  Indication/Diagnosis: chronic migraine BOTOX(J0585) injection was performed according to protocol by [facility]. 200 units of BOTOX was dissolved into 4 cc NS.   NDC: [address]-40  Type of toxin: Botox     Botox- 200 units x 1 vial Lot: [id number] Expiration: 11/2025 NDC: 8295-6213-08   Bacteriostatic 0.9% Sodium Chloride- 4 mL  Lot: [id number] Expiration: 06/11/2024 NDC: 9629-5284-13   Dx: K44.010         Description of procedure:  The patient was placed in a sitting position. The standard protocol was used for Botox as follows, with 5 units of Botox injected at each site:   -Procerus muscle, midline injection  -Corrugator muscle, bilateral injection  -Frontalis muscle, bilateral injection, with 2 sites each side, medial injection was performed in the upper one third of the frontalis muscle, in the region vertical from the medial inferior edge of the superior orbital rim. The lateral injection was again in the upper one third of  the forehead vertically above the lateral limbus of the cornea, 1.5 cm lateral to the medial injection site.  -Temporalis muscle injection, 4 sites, bilaterally. The first injection was 3 cm above the tragus of the ear, second injection site was 1.5 cm to 3 cm up from the first injection site in line with the tragus of the ear. The third injection site was 1.5-3 cm forward between the first 2 injection sites. The fourth injection site was 1.5 cm posterior to the second injection site.  -Occipitalis muscle injection, 3 sites, bilaterally. The first injection was done one half way between the occipital protuberance and the tip of the mastoid process behind the ear. The second injection site was done lateral and superior to the first, 1 fingerbreadth from the first injection. The third injection site was 1 fingerbreadth superiorly and medially from the first injection site.  -Cervical paraspinal muscle injection, 2 sites, bilateral knee first injection site was 1 cm from the midline of the cervical spine, 3 cm inferior to the lower border of the occipital protuberance. The second injection site was 1.5 cm superiorly and laterally to the first injection site.  -Trapezius muscle injection was performed at 3 sites, bilaterally. The first injection site was in the upper trapezius muscle halfway between the inflection point of the neck, and the acromion. The second injection site was one half way between the acromion and the first injection site. The third injection was done between the first injection site and the inflection point of the neck.   Will return for repeat injection in 3 months.   A 200 unit sof Botox was used, 155 units were  injected, the rest of the Botox was wasted. The patient tolerated the procedure well, there were no complications of the above procedure.  Butch Penny, MSN, NP-C 09/14/2023, 2:57 PM [facility] [address] [place], [place] [address] [phone]

## 2023-09-14 NOTE — Telephone Encounter (Signed)
What is the status of the appeal, please?

## 2023-09-22 ENCOUNTER — Other Ambulatory Visit (HOSPITAL_COMMUNITY): Payer: Self-pay

## 2023-09-22 ENCOUNTER — Telehealth: Payer: Self-pay | Admitting: Pharmacy Technician

## 2023-09-22 NOTE — Telephone Encounter (Signed)
Pharmacy Patient Advocate Encounter   Received notification from CoverMyMeds that prior authorization for DUPIXENT 300MG  is required/requested.   Insurance verification completed.   The patient is insured through [facility] .   Per test claim: PA required; PA submitted to above mentioned insurance via CoverMyMeds Key/confirmation #/EOC [facility] Status is pending

## 2023-09-22 NOTE — Telephone Encounter (Signed)
Pharmacy Patient Advocate Encounter  Received notification from [facility] that Prior Authorization for DUPIXENT 300MG  has been DENIED.  Full denial letter will be uploaded to the media tab. See denial reason below.   PA #/Case ID/Reference #:  [id number]

## 2023-09-24 ENCOUNTER — Other Ambulatory Visit: Payer: Self-pay

## 2023-09-24 MED ORDER — FLUTICASONE PROPIONATE HFA 220 MCG/ACT IN AERO: INHALATION_SPRAY | RESPIRATORY_TRACT | 2 refills | Status: DC

## 2023-09-24 NOTE — Telephone Encounter (Signed)
Called the patient. No answer. Left a voicemail of my call and asked for a return call to discuss.

## 2023-09-24 NOTE — Telephone Encounter (Signed)
Patient notified of the insurance decision. Agrees to fluticasone 2 puffs BID swallowed. Pharmacy confirmed.

## 2023-10-02 NOTE — Telephone Encounter (Signed)
 PA request has been Submitted. New Encounter created for follow up. For additional info see Pharmacy Prior Auth telephone encounter from 02/11.

## 2023-10-12 NOTE — Telephone Encounter (Signed)
 This is a 48 old. Please update or appeal, whichever is appropriate.

## 2023-10-13 NOTE — Telephone Encounter (Signed)
 Patient started on Flovent inhaler 2 puffs swallowed, not inhaled.

## 2023-10-13 NOTE — Telephone Encounter (Signed)
 We have denied your request because: A) You have not tried systemic (works on the whole body) corticosteroids or oral topical corticosteroids (budesonide, fluticasone [powder or suspension for inhalation] swallowed. Full denial letter in media.

## 2023-10-27 ENCOUNTER — Other Ambulatory Visit (HOSPITAL_COMMUNITY): Payer: Self-pay

## 2023-10-27 NOTE — Telephone Encounter (Signed)
 Dupixent was changed on 02-13 to different therapy. What medication is patient pharmacy needing information for?

## 2023-12-11 ENCOUNTER — Telehealth: Payer: Self-pay | Admitting: Adult Health

## 2023-12-11 ENCOUNTER — Ambulatory Visit: Payer: Managed Care, Other (non HMO) | Admitting: Adult Health

## 2023-12-11 DIAGNOSIS — G43709 Chronic migraine without aura, not intractable, without status migrainosus: Secondary | ICD-10-CM | POA: Diagnosis not present

## 2023-12-11 MED ORDER — ONABOTULINUMTOXINA 200 UNITS IJ SOLR
155.0000 [IU] | Freq: Once | INTRAMUSCULAR | Status: AC
  Administered 2023-12-11: 155 [IU] via INTRAMUSCULAR

## 2023-12-11 NOTE — Telephone Encounter (Signed)
 Patient needs 3 month Botox  appt :) Thank you!

## 2023-12-11 NOTE — Progress Notes (Signed)
 Botox -200U x 1vial Lot: [id number] Expiration: 04/2026 NDC: 5621-3086-57  Bacteriostatic 0.9% Sodium Chloride - 4mL total [id number] Expiration:06/11/2024 NDC: 8413-2440-10  Witnessed by: Amye Baller up by [facility]

## 2023-12-11 NOTE — Progress Notes (Signed)
 12/11/23: Allergies have triggered some migraines. In the last 3 weeks more headaches d/t allergies. Prior to that was rarely having to use nurtec- maybe 4-5 times. Nurtec helps.   09/14/23: Migraines have improved. Less intense. Still have 2-3 migraines a week. She typically can work through these. Uses nurtec for migraines.   06/15/23: Botox  has helped with the severity of her headaches. Not sure how much nurtec has helped.  03/19/23: First botox  injections. Reviewed the procedure and potential side effects with the patient.   BOTOX  PROCEDURE NOTE FOR MIGRAINE HEADACHE    Contraindications and precautions discussed with patient(above). Aseptic procedure was observed and patient tolerated procedure. Procedure performed by Clem Currier, NP  The condition has existed for more than 6 months, and pt does not have a diagnosis of ALS, Myasthenia Gravis or Lambert-Eaton Syndrome.  Risks and benefits of injections discussed and pt agrees to proceed with the procedure.  Written consent obtained  These injections are medically necessary. These injections do not cause sedations or hallucinations which the oral therapies may cause.  Indication/Diagnosis: chronic migraine BOTOX (Z6109) injection was performed according to protocol by [facility]. 200 units of BOTOX  was dissolved into 4 cc NS.   NDC: [address]-19  Type of toxin: Botox   Botox -200U x 1vial Lot: [id number] Expiration: 04/2026 NDC: 1478-2956-21   Bacteriostatic 0.9% Sodium Chloride - 4mL total [id number] Expiration:06/11/2024 NDC: 9629-5284-13        Description of procedure:  The patient was placed in a sitting position. The standard protocol was used for Botox  as follows, with 5 units of Botox  injected at each site:   -Procerus muscle, midline injection  -Corrugator muscle, bilateral injection  -Frontalis muscle, bilateral injection, with 2 sites each side, medial injection was performed in the upper one third of the  frontalis muscle, in the region vertical from the medial inferior edge of the superior orbital rim. The lateral injection was again in the upper one third of the forehead vertically above the lateral limbus of the cornea, 1.5 cm lateral to the medial injection site.  -Temporalis muscle injection, 4 sites, bilaterally. The first injection was 3 cm above the tragus of the ear, second injection site was 1.5 cm to 3 cm up from the first injection site in line with the tragus of the ear. The third injection site was 1.5-3 cm forward between the first 2 injection sites. The fourth injection site was 1.5 cm posterior to the second injection site.  -Occipitalis muscle injection, 3 sites, bilaterally. The first injection was done one half way between the occipital protuberance and the tip of the mastoid process behind the ear. The second injection site was done lateral and superior to the first, 1 fingerbreadth from the first injection. The third injection site was 1 fingerbreadth superiorly and medially from the first injection site.  -Cervical paraspinal muscle injection, 2 sites, bilateral knee first injection site was 1 cm from the midline of the cervical spine, 3 cm inferior to the lower border of the occipital protuberance. The second injection site was 1.5 cm superiorly and laterally to the first injection site.  -Trapezius muscle injection was performed at 3 sites, bilaterally. The first injection site was in the upper trapezius muscle halfway between the inflection point of the neck, and the acromion. The second injection site was one half way between the acromion and the first injection site. The third injection was done between the first injection site and the inflection point of the neck.   Will return for repeat  injection in 3 months.   A 200 unit sof Botox  was used, 155 units were injected, the rest of the Botox  was wasted. The patient tolerated the procedure well, there were no complications of  the above procedure.  Clem Currier, MSN, NP-C 12/11/2023, 11:43 AM [facility] [address] [place], [place] [address] [phone]

## 2023-12-21 ENCOUNTER — Other Ambulatory Visit: Payer: Self-pay | Admitting: Internal Medicine

## 2023-12-23 ENCOUNTER — Other Ambulatory Visit: Payer: Self-pay

## 2023-12-23 MED ORDER — FLUTICASONE PROPIONATE HFA 220 MCG/ACT IN AERO: INHALATION_SPRAY | RESPIRATORY_TRACT | 2 refills | Status: DC

## 2024-02-03 ENCOUNTER — Telehealth: Payer: Self-pay | Admitting: Adult Health

## 2024-02-03 ENCOUNTER — Encounter: Payer: Self-pay | Admitting: Internal Medicine

## 2024-02-03 ENCOUNTER — Encounter: Payer: Self-pay | Admitting: Adult Health

## 2024-02-03 DIAGNOSIS — G43719 Chronic migraine without aura, intractable, without status migrainosus: Secondary | ICD-10-CM

## 2024-02-03 NOTE — Telephone Encounter (Signed)
 Patient has a new BC[employer] plan effective 02/09/2024. I will submit a new PA in a few days once her plan has activated.

## 2024-02-03 NOTE — Telephone Encounter (Signed)
 Faxed [employer] PA renewal form with notes to [phone].

## 2024-02-05 ENCOUNTER — Other Ambulatory Visit: Payer: Self-pay | Admitting: Adult Health

## 2024-02-05 DIAGNOSIS — G43719 Chronic migraine without aura, intractable, without status migrainosus: Secondary | ICD-10-CM

## 2024-02-09 NOTE — Telephone Encounter (Signed)
 Completed PA form for new BCBS and faxed with notes to [phone].

## 2024-02-11 ENCOUNTER — Other Ambulatory Visit: Payer: Self-pay | Admitting: Adult Health

## 2024-02-11 ENCOUNTER — Other Ambulatory Visit (HOSPITAL_COMMUNITY): Payer: Self-pay

## 2024-02-11 ENCOUNTER — Telehealth: Payer: Self-pay

## 2024-02-11 ENCOUNTER — Telehealth: Payer: Self-pay | Admitting: *Deleted

## 2024-02-11 ENCOUNTER — Other Ambulatory Visit: Payer: Self-pay

## 2024-02-11 MED ORDER — ONABOTULINUMTOXINA 200 UNITS IJ SOLR: INTRAMUSCULAR | 3 refills | Status: DC

## 2024-02-11 MED ORDER — DUPILUMAB 200 MG/1.14ML ~~LOC~~ SOSY: 300.0000 mg | PREFILLED_SYRINGE | SUBCUTANEOUS | 0 refills | Status: AC

## 2024-02-11 NOTE — Telephone Encounter (Signed)
 Received approval from [facility], pt will continue to fill through [facility].  Auth#: [id number] (02/09/24-07/26/24)

## 2024-02-11 NOTE — Telephone Encounter (Signed)
 Pharmacy Patient Advocate Encounter   Received notification from Patient Advice Request messages that prior authorization for Dupixent  200MG /1.14ML auto-injectors is required/requested.   Insurance verification completed.   The patient is insured through [facility] .   Per test claim: PA required; PA submitted to above mentioned insurance via CoverMyMeds Key/confirmation #/EOC [id number] Status is pending

## 2024-02-11 NOTE — Telephone Encounter (Signed)
 Can we do Nurtec & Rizatriptan  PAs?

## 2024-02-11 NOTE — Telephone Encounter (Signed)
 PA request has been Submitted. New Encounter has been or will be created for follow up. For additional info see Pharmacy Prior Auth telephone encounter from 02-11-2024.

## 2024-02-11 NOTE — Addendum Note (Signed)
 Addended by: Aubrey Voong K on: 02/11/2024 08:29 AM   Modules accepted: Orders

## 2024-02-15 ENCOUNTER — Telehealth: Payer: Self-pay

## 2024-02-15 ENCOUNTER — Other Ambulatory Visit (HOSPITAL_COMMUNITY): Payer: Self-pay

## 2024-02-15 NOTE — Telephone Encounter (Signed)
 Pharmacy Patient Advocate Encounter   Received notification from Physician's Office that prior authorization for Nurtec 75MG  dispersible tablets is required/requested.   Insurance verification completed.   The patient is insured through [facility] .   Per test claim: The current 30 day co-pay is, $0.  No PA needed at this time. This test claim was processed through [facility]- copay amounts may vary at other pharmacies due to pharmacy/plan contracts, or as the patient moves through the different stages of their insurance plan.

## 2024-02-15 NOTE — Telephone Encounter (Signed)
 Pharmacy Patient Advocate Encounter   Received notification from Physician's Office that prior authorization for Rizatriptan  is required/requested.   Insurance verification completed.   The patient is insured through [facility] AD[employer]/RX [employer]VANCE .   Per test claim: The current 30 day co-pay is, $10.00.  No PA needed at this time. This test claim was processed through [facility]- copay amounts may vary at other pharmacies due to pharmacy/plan contracts, or as the patient moves through the different stages of their insurance plan.

## 2024-02-15 NOTE — Telephone Encounter (Signed)
 Pharmacy Patient Advocate Encounter  Additional information has been requested from the patient's insurance in order to proceed with the prior authorization request. Requested information has been sent, or form has been filled out and faxed back to xxx   Insurance is requiring documentation showing the patient has tried and failed or has a clinical intolerance/contraindication to swallowed glucocoricoid and was unable to achieve adequate control of symptoms with guideline recommended therapy. I am not able to find where patient has done this.

## 2024-02-16 MED ORDER — BUDESONIDE 2 MG/10ML PO SUSP: 2.0000 mg | Freq: Two times a day (BID) | ORAL | 0 refills | Status: DC

## 2024-02-16 NOTE — Telephone Encounter (Signed)
 Budesonide  oral suspension RX sent to pharmacy on file. MyChart message sent to patient.

## 2024-02-16 NOTE — Telephone Encounter (Signed)
 Pharmacy Patient Advocate Encounter  Received notification from [facility] that Prior Authorization for Dupixent  200MG /1.14ML auto-injectors has been DENIED.  Full denial letter will be uploaded to the media tab. See denial reason below.  The request for coverage of Dupixent  is denied.  Dupixent  is approved when the member has tried a swallowed steroid medication (such as fluticasone  or budesonide ) and it did not work, or was not tolerated. Medical records must be sent in for review.  In this case, the medical records sent in do not confirm that the member has tried a swallowed steroid medication.  Please note: Any of the suggested alternatives may require a separate prior authorization request.   PA #/Case ID/Reference #: [id number]

## 2024-02-16 NOTE — Telephone Encounter (Signed)
 Without the documentation I cannot guarantee that this will receive an approval.

## 2024-02-16 NOTE — Telephone Encounter (Signed)
 It is listed under her past medications, dated 12/23/23.

## 2024-02-22 ENCOUNTER — Other Ambulatory Visit (HOSPITAL_COMMUNITY): Payer: Self-pay

## 2024-02-22 ENCOUNTER — Telehealth: Payer: Self-pay

## 2024-02-22 NOTE — Telephone Encounter (Signed)
 PA request has been Submitted. New Encounter has been or will be created for follow up. For additional info see Pharmacy Prior Auth telephone encounter from 07/14.

## 2024-02-22 NOTE — Telephone Encounter (Signed)
 Pt has a new BCBS plan that is on the chart. Can we try that PA? Pharmacy cannot get it to go through yet on BCBS but there is a discount card that can be used if insurance still won't cover.

## 2024-02-22 NOTE — Telephone Encounter (Signed)
 I had an extended phone call with multiple [facility] reps. They were not able to see on their end that the Botox  needed clarification, and delivery is set up for tomorrow 02/23/24. Rep Venetia states there may be another medication that requires clarification, but the Botox  is all set.

## 2024-02-22 NOTE — Telephone Encounter (Signed)
*  Gastro  Pharmacy Patient Advocate Encounter   Received notification from Pt Calls Messages that prior authorization for Eohilia  2MG /10ML suspension  is required/requested.   Insurance verification completed.   The patient is insured through [facility] .   Per test claim: PA required; PA submitted to above mentioned insurance via CoverMyMeds Key/confirmation #/EOC [id number] Status is pending

## 2024-02-22 NOTE — Telephone Encounter (Signed)
 Does Budesonide  RX need a PA? Please let us  know. Thanks

## 2024-02-22 NOTE — Telephone Encounter (Signed)
 Budesonide  since we have already sent that prescription. Thank you

## 2024-02-23 ENCOUNTER — Other Ambulatory Visit (HOSPITAL_COMMUNITY): Payer: Self-pay

## 2024-02-23 ENCOUNTER — Telehealth: Payer: Self-pay

## 2024-02-23 NOTE — Telephone Encounter (Signed)
 Pharmacy Patient Advocate Encounter  Received notification from [facility] that Prior Authorization for Eohilia  2MG /10ML Suspension has been APPROVED from 02-22-2024 to 02-21-2025   PA #/Case ID/Reference #: [id number]

## 2024-02-23 NOTE — Telephone Encounter (Signed)
 Pharmacy Patient Advocate Encounter   Received notification from Patient Advice Request messages that prior authorization for Dexlansoprazole  60mg  capsules is required/requested.   Insurance verification completed.   The patient is insured through [facility] .   Per test claim: PA required; PA submitted to above mentioned insurance via Fax Key/confirmation #/EOC CHARON [phone] Status is pending

## 2024-02-24 ENCOUNTER — Other Ambulatory Visit (HOSPITAL_COMMUNITY): Payer: Self-pay

## 2024-02-24 ENCOUNTER — Other Ambulatory Visit: Payer: Self-pay

## 2024-02-24 ENCOUNTER — Telehealth: Payer: Self-pay

## 2024-02-24 MED ORDER — BUDESONIDE 2 MG/10ML PO SUSP: 2.0000 mg | Freq: Two times a day (BID) | ORAL | 0 refills | Status: DC

## 2024-02-24 NOTE — Telephone Encounter (Signed)
 Pharmacy Patient Advocate Encounter  Received notification from [facility] that Prior Authorization for Dexlansoprazole  60mg  capsules has been APPROVED from 02-23-2024 to 02-22-2025   PA #/Case ID/Reference #: CHARON [id number]

## 2024-02-24 NOTE — Telephone Encounter (Signed)
 Pharmacy Patient Advocate Encounter   Received notification from Physician's Office that prior authorization for Rizatriptan  10mg  ODT is required/requested.   Insurance verification completed.   The patient is insured through [facility] .   Per test claim: PA required; PA submitted to above mentioned insurance via Fax Key/confirmation #/EOC N/A Status is pending  Faxed completed form along with clinicals to [facility] at [phone]

## 2024-02-24 NOTE — Telephone Encounter (Signed)
 Per test claim PA required-per cmm not required. I called [employer] and they state this med does require PA-they are faxing me the forms.

## 2024-02-26 DIAGNOSIS — N390 Urinary tract infection, site not specified: Secondary | ICD-10-CM | POA: Diagnosis not present

## 2024-03-09 DIAGNOSIS — M450 Ankylosing spondylitis of multiple sites in spine: Secondary | ICD-10-CM | POA: Diagnosis not present

## 2024-03-09 DIAGNOSIS — G43719 Chronic migraine without aura, intractable, without status migrainosus: Secondary | ICD-10-CM | POA: Diagnosis not present

## 2024-03-10 ENCOUNTER — Ambulatory Visit: Admitting: Adult Health

## 2024-03-10 VITALS — BP 128/86 | HR 75

## 2024-03-10 DIAGNOSIS — G43709 Chronic migraine without aura, not intractable, without status migrainosus: Secondary | ICD-10-CM | POA: Diagnosis not present

## 2024-03-10 MED ORDER — ONABOTULINUMTOXINA 200 UNITS IJ SOLR
155.0000 [IU] | Freq: Once | INTRAMUSCULAR | Status: AC
  Administered 2024-03-10: 155 [IU] via INTRAMUSCULAR

## 2024-03-10 NOTE — Progress Notes (Signed)
 Botox - 200 units x 1 vial Lot: [id number] Expiration: 05/2026 NDC: 9976-6078-97  Bacteriostatic 0.9% Sodium Chloride-  4mL  Lot: [id number] Expiration: 05/2025 NDC: 9590-8033-97  Dx: G43.709  S/P  Witnessed by Heather PEAK

## 2024-03-10 NOTE — Progress Notes (Signed)
 03/10/24: 4-5 migraines since the last injection cycle. Botox  working well. Uses Nurtec or rizatriptan  and that works well.  No aura with these migraines.   12/11/23: Allergies have triggered some migraines. In the last 3 weeks more headaches d/t allergies. Prior to that was rarely having to use nurtec- maybe 4-5 times. Nurtec helps.   09/14/23: Migraines have improved. Less intense. Still have 2-3 migraines a week. She typically can work through these. Uses nurtec for migraines.   06/15/23: Botox  has helped with the severity of her headaches. Not sure how much nurtec has helped.  03/19/23: First botox  injections. Reviewed the procedure and potential side effects with the patient.   BOTOX  PROCEDURE NOTE FOR MIGRAINE HEADACHE    Contraindications and precautions discussed with patient(above). Aseptic procedure was observed and patient tolerated procedure. Procedure performed by Duwaine Russell, NP  The condition has existed for more than 6 months, and pt does not have a diagnosis of ALS, Myasthenia Gravis or Lambert-Eaton Syndrome.  Risks and benefits of injections discussed and pt agrees to proceed with the procedure.  Written consent obtained  These injections are medically necessary. These injections do not cause sedations or hallucinations which the oral therapies may cause.  Indication/Diagnosis: chronic migraine BOTOX (G9414) injection was performed according to protocol by [facility]. 200 units of BOTOX  was dissolved into 4 cc NS.   NDC: [address]-98  Type of toxin: Botox   Botox - 200 units x 1 vial Lot: [id number] Expiration: 05/2026 NDC: 9976-6078-97   Bacteriostatic 0.9% Sodium Chloride - 4 mL  Lot: [id number] Expiration: 05/2025 NDC: 9590-8033-97   Dx: H56.290       Description of procedure:  The patient was placed in a sitting position. The standard protocol was used for Botox  as follows, with 5 units of Botox  injected at each site:   -Procerus muscle, midline  injection  -Corrugator muscle, bilateral injection  -Frontalis muscle, bilateral injection, with 2 sites each side, medial injection was performed in the upper one third of the frontalis muscle, in the region vertical from the medial inferior edge of the superior orbital rim. The lateral injection was again in the upper one third of the forehead vertically above the lateral limbus of the cornea, 1.5 cm lateral to the medial injection site.  -Temporalis muscle injection, 4 sites, bilaterally. The first injection was 3 cm above the tragus of the ear, second injection site was 1.5 cm to 3 cm up from the first injection site in line with the tragus of the ear. The third injection site was 1.5-3 cm forward between the first 2 injection sites. The fourth injection site was 1.5 cm posterior to the second injection site.  -Occipitalis muscle injection, 3 sites, bilaterally. The first injection was done one half way between the occipital protuberance and the tip of the mastoid process behind the ear. The second injection site was done lateral and superior to the first, 1 fingerbreadth from the first injection. The third injection site was 1 fingerbreadth superiorly and medially from the first injection site.  -Cervical paraspinal muscle injection, 2 sites, bilateral knee first injection site was 1 cm from the midline of the cervical spine, 3 cm inferior to the lower border of the occipital protuberance. The second injection site was 1.5 cm superiorly and laterally to the first injection site.  -Trapezius muscle injection was performed at 3 sites, bilaterally. The first injection site was in the upper trapezius muscle halfway between the inflection point of the neck, and the acromion. The second injection  site was one half way between the acromion and the first injection site. The third injection was done between the first injection site and the inflection point of the neck.   Will return for repeat injection  in 3 months.   A 200 unit sof Botox  was used, 155 units were injected, the rest of the Botox  was wasted. The patient tolerated the procedure well, there were no complications of the above procedure.  Duwaine Russell, MSN, NP-C 03/10/2024, 8:39 AM [facility] [address], [address] [place], [place] [address] [phone]

## 2024-03-10 NOTE — Telephone Encounter (Signed)
 Pharmacy Patient Advocate Encounter  Received notification from [facility] that Prior Authorization for Rizatriptan  ODT has been DENIED.  Full denial letter will be uploaded to the media tab. See denial reason below.   PA #/Case ID/Reference #: [id number]

## 2024-03-13 ENCOUNTER — Other Ambulatory Visit: Payer: Self-pay | Admitting: Adult Health

## 2024-03-16 ENCOUNTER — Other Ambulatory Visit (HOSPITAL_COMMUNITY): Payer: Self-pay

## 2024-03-16 ENCOUNTER — Telehealth: Payer: Self-pay

## 2024-03-16 MED ORDER — NYSTATIN 100000 UNIT/ML MT SUSP
OROMUCOSAL | 1 refills | Status: DC
Start: 2024-03-16 — End: 2024-06-08

## 2024-03-16 NOTE — Telephone Encounter (Signed)
 Pharmacy Patient Advocate Encounter   Received notification from Physician's Office that prior authorization for Nurtec 75MG  dispersible tablets is required/requested.   Insurance verification completed.   The patient is insured through [facility] .   Per test claim: PA required; PA submitted to above mentioned insurance via CoverMyMeds Key/confirmation #/EOC [id number] Status is pending

## 2024-03-16 NOTE — Telephone Encounter (Signed)
 Pharmacy Patient Advocate Encounter  Received notification from [facility] that Prior Authorization for Nurtec 75MG  dispersible tablets has been APPROVED from 03/16/2024 to 06/08/2024. Ran test claim, Copay is $0. This test claim was processed through [facility]- copay amounts may vary at other pharmacies due to pharmacy/plan contracts, or as the patient moves through the different stages of their insurance plan.   PA #/Case ID/Reference #: PA Case ID #: [id number]

## 2024-03-16 NOTE — Addendum Note (Signed)
 Addended by: Loana Salvaggio N on: 03/16/2024 12:24 PM   Modules accepted: Orders

## 2024-03-16 NOTE — Telephone Encounter (Signed)
 Noted

## 2024-04-01 DIAGNOSIS — F419 Anxiety disorder, unspecified: Secondary | ICD-10-CM | POA: Diagnosis not present

## 2024-04-01 DIAGNOSIS — M797 Fibromyalgia: Secondary | ICD-10-CM | POA: Diagnosis not present

## 2024-04-01 DIAGNOSIS — I10 Essential (primary) hypertension: Secondary | ICD-10-CM | POA: Diagnosis not present

## 2024-04-01 DIAGNOSIS — F909 Attention-deficit hyperactivity disorder, unspecified type: Secondary | ICD-10-CM | POA: Diagnosis not present

## 2024-04-13 DIAGNOSIS — M450 Ankylosing spondylitis of multiple sites in spine: Secondary | ICD-10-CM | POA: Diagnosis not present

## 2024-04-13 DIAGNOSIS — M797 Fibromyalgia: Secondary | ICD-10-CM | POA: Diagnosis not present

## 2024-04-13 DIAGNOSIS — M5136 Other intervertebral disc degeneration, lumbar region with discogenic back pain only: Secondary | ICD-10-CM | POA: Diagnosis not present

## 2024-04-13 DIAGNOSIS — R600 Localized edema: Secondary | ICD-10-CM | POA: Diagnosis not present

## 2024-05-05 ENCOUNTER — Encounter: Payer: Self-pay | Admitting: Adult Health

## 2024-05-05 ENCOUNTER — Other Ambulatory Visit: Payer: Self-pay | Admitting: Internal Medicine

## 2024-05-05 ENCOUNTER — Encounter: Payer: Self-pay | Admitting: Internal Medicine

## 2024-05-05 DIAGNOSIS — K219 Gastro-esophageal reflux disease without esophagitis: Secondary | ICD-10-CM

## 2024-05-05 DIAGNOSIS — G43719 Chronic migraine without aura, intractable, without status migrainosus: Secondary | ICD-10-CM

## 2024-05-08 ENCOUNTER — Other Ambulatory Visit: Payer: Self-pay | Admitting: Physician Assistant

## 2024-05-08 DIAGNOSIS — K219 Gastro-esophageal reflux disease without esophagitis: Secondary | ICD-10-CM

## 2024-05-09 ENCOUNTER — Other Ambulatory Visit (HOSPITAL_COMMUNITY): Payer: Self-pay

## 2024-05-09 ENCOUNTER — Telehealth: Payer: Self-pay | Admitting: Adult Health

## 2024-05-09 ENCOUNTER — Other Ambulatory Visit: Payer: Self-pay

## 2024-05-09 DIAGNOSIS — G43719 Chronic migraine without aura, intractable, without status migrainosus: Secondary | ICD-10-CM

## 2024-05-09 MED ORDER — ONABOTULINUMTOXINA 200 UNITS IJ SOLR: INTRAMUSCULAR | 3 refills | Status: DC

## 2024-05-09 MED ORDER — ESOMEPRAZOLE MAGNESIUM 40 MG PO CPDR: 40.0000 mg | DELAYED_RELEASE_CAPSULE | Freq: Two times a day (BID) | ORAL | 5 refills | Status: DC

## 2024-05-09 NOTE — Telephone Encounter (Signed)
 Pt states [facility] is no longer in network with her [employer] and she needs to fill through [facility]. I completed [employer] PA form with [facility] as the servicing pharmacy and placed in pod for NP signature.

## 2024-05-09 NOTE — Telephone Encounter (Signed)
 Yes. 90 day supply co-pay is $300.00 and 30 day supply co-pay is $100.00. PA effective until July 2026

## 2024-05-10 ENCOUNTER — Other Ambulatory Visit (HOSPITAL_COMMUNITY): Payer: Self-pay

## 2024-05-10 NOTE — Telephone Encounter (Signed)
 Patient insurance will not pay for anything that has an OTC equivalent (omeprazole, esomeprazole, lansoprazole, etc). Rabeprazole is a plan exclusion and insurance will not pay. Voquezna will require a PA and will need patient to try and fail all the preferred alternatives, which are all the ones listed as well as dexlansoprazole . If patient wants to fill as an rx, can use any discount card at pharmacy to possibly lower costs or can pick up over the counter.

## 2024-05-10 NOTE — Telephone Encounter (Signed)
 Just to be aware, esomeprazole is a plan exclusion due to OTC equivalent.

## 2024-05-11 NOTE — Telephone Encounter (Signed)
 Received approval with [facility] as the servicing pharmacy, faxed a copy to them @ [phone].  Auth#: [id number] (05/11/24-04/12/25)

## 2024-05-11 NOTE — Telephone Encounter (Signed)
 Faxed signed PA form and notes to [phone].

## 2024-05-11 NOTE — Telephone Encounter (Signed)
 Left message for pt to call back

## 2024-05-12 NOTE — Telephone Encounter (Signed)
 Left message for patient to call back

## 2024-05-16 NOTE — Telephone Encounter (Signed)
 Patient returning call Requesting a call back  Please advise  Thank you

## 2024-05-16 NOTE — Telephone Encounter (Signed)
 Left message for pt to call back

## 2024-05-16 NOTE — Telephone Encounter (Signed)
 [facility] is saying they can't fill med through pt's medical benefit. I called [employer] and spoke with Nena, she states [facility] is in network. I completed a new PA form with [facility] as the servicing provider and faxed with notes to [phone].

## 2024-05-17 MED ORDER — ONABOTULINUMTOXINA 200 UNITS IJ SOLR
INTRAMUSCULAR | 3 refills | Status: AC
Start: 2024-05-17 — End: ?

## 2024-05-17 NOTE — Telephone Encounter (Addendum)
 Pt made aware of Quentin Mulling PA  recommendations. Pt verbalized understanding with all questions answered.

## 2024-05-17 NOTE — Addendum Note (Signed)
 Addended by: HILLIARD HEATHER CROME on: 05/17/2024 02:56 PM   Modules accepted: Orders

## 2024-05-17 NOTE — Telephone Encounter (Signed)
 Received approval, please send rx to [facility] in [place].  Auth#: [id number] (05/16/24-04/17/25)

## 2024-05-17 NOTE — Telephone Encounter (Signed)
 done

## 2024-05-27 ENCOUNTER — Telehealth: Payer: Self-pay | Admitting: Adult Health

## 2024-05-27 NOTE — Telephone Encounter (Signed)
 Desaree @ [facility] she has called to scheduled delivery of Botox  for 10/21: SDV, quantity of 1 200 units call back #[phone]

## 2024-05-30 DIAGNOSIS — G43719 Chronic migraine without aura, intractable, without status migrainosus: Secondary | ICD-10-CM | POA: Diagnosis not present

## 2024-05-30 DIAGNOSIS — G43709 Chronic migraine without aura, not intractable, without status migrainosus: Secondary | ICD-10-CM | POA: Diagnosis not present

## 2024-06-06 ENCOUNTER — Ambulatory Visit: Admitting: Adult Health

## 2024-06-08 ENCOUNTER — Ambulatory Visit: Admitting: Adult Health

## 2024-06-08 ENCOUNTER — Ambulatory Visit (INDEPENDENT_AMBULATORY_CARE_PROVIDER_SITE_OTHER): Admitting: Adult Health

## 2024-06-08 VITALS — BP 121/80 | HR 93

## 2024-06-08 DIAGNOSIS — G43719 Chronic migraine without aura, intractable, without status migrainosus: Secondary | ICD-10-CM

## 2024-06-08 MED ORDER — ONABOTULINUMTOXINA 200 UNITS IJ SOLR
155.0000 [IU] | Freq: Once | INTRAMUSCULAR | Status: AC
  Administered 2024-06-08: 155 [IU] via INTRAMUSCULAR

## 2024-06-08 NOTE — Progress Notes (Signed)
 06/08/24: Reports that Botox  continues to work well.  She is having approximately 6 migraines, not always severe.  Continues to use Nurtec or rizatriptan  for abortive therapy. Reports that she had2 migraines with visual ura- brights lines Reports that she tolerated the last injections well.  03/10/24: 4-5 migraines since the last injection cycle. Botox  working well. Uses Nurtec or rizatriptan  and that works well.  No aura with these migraines.   12/11/23: Allergies have triggered some migraines. In the last 3 weeks more headaches d/t allergies. Prior to that was rarely having to use nurtec- maybe 4-5 times. Nurtec helps.   09/14/23: Migraines have improved. Less intense. Still have 2-3 migraines a week. She typically can work through these. Uses nurtec for migraines.   06/15/23: Botox  has helped with the severity of her headaches. Not sure how much nurtec has helped.  03/19/23: First botox  injections. Reviewed the procedure and potential side effects with the patient.   BOTOX  PROCEDURE NOTE FOR MIGRAINE HEADACHE    Contraindications and precautions discussed with patient(above). Aseptic procedure was observed and patient tolerated procedure. Procedure performed by Duwaine Russell, NP  The condition has existed for more than 6 months, and pt does not have a diagnosis of ALS, Myasthenia Gravis or Lambert-Eaton Syndrome.  Risks and benefits of injections discussed and pt agrees to proceed with the procedure.  Written consent obtained  These injections are medically necessary. These injections do not cause sedations or hallucinations which the oral therapies may cause.  Indication/Diagnosis: chronic migraine BOTOX (G9414) injection was performed according to protocol by [employer]. 200 units of BOTOX  was dissolved into 4 cc NS.   NDC: [address]-98  Type of toxin: Botox   Botox - 200 units x 1 vial Lot: [id number] Expiration: 07/2026 NDC: 9976-6078-97   Bacteriostatic 0.9% Sodium Chloride -  4 mL   Lot: [id number] Expiration: 05/2025 NDC: 9590-8033-97   Dx: H56.290         Description of procedure:  The patient was placed in a sitting position. The standard protocol was used for Botox  as follows, with 5 units of Botox  injected at each site:   -Procerus muscle, midline injection  -Corrugator muscle, bilateral injection  -Frontalis muscle, bilateral injection, with 2 sites each side, medial injection was performed in the upper one third of the frontalis muscle, in the region vertical from the medial inferior edge of the superior orbital rim. The lateral injection was again in the upper one third of the forehead vertically above the lateral limbus of the cornea, 1.5 cm lateral to the medial injection site.  -Temporalis muscle injection, 4 sites, bilaterally. The first injection was 3 cm above the tragus of the ear, second injection site was 1.5 cm to 3 cm up from the first injection site in line with the tragus of the ear. The third injection site was 1.5-3 cm forward between the first 2 injection sites. The fourth injection site was 1.5 cm posterior to the second injection site.  -Occipitalis muscle injection, 3 sites, bilaterally. The first injection was done one half way between the occipital protuberance and the tip of the mastoid process behind the ear. The second injection site was done lateral and superior to the first, 1 fingerbreadth from the first injection. The third injection site was 1 fingerbreadth superiorly and medially from the first injection site.  -Cervical paraspinal muscle injection, 2 sites, bilateral knee first injection site was 1 cm from the midline of the cervical spine, 3 cm inferior to the lower border of the occipital  protuberance. The second injection site was 1.5 cm superiorly and laterally to the first injection site.  -Trapezius muscle injection was performed at 3 sites, bilaterally. The first injection site was in the upper trapezius muscle halfway  between the inflection point of the neck, and the acromion. The second injection site was one half way between the acromion and the first injection site. The third injection was done between the first injection site and the inflection point of the neck.   Will return for repeat injection in 3 months.   A 200 unit sof Botox  was used, 155 units were injected, the rest of the Botox  was wasted. The patient tolerated the procedure well, there were no complications of the above procedure.  Duwaine Russell, MSN, NP-C 06/08/2024, 9:43 AM [facility] [address] [place], [place] [address] [phone]

## 2024-06-08 NOTE — Progress Notes (Signed)
 Botox - 200 units x 1 vial Lot: [id number] Expiration: 07/2026 NDC: 9976-6078-97  Bacteriostatic 0.9% Sodium Chloride -  4 mL  Lot: [id number] Expiration: 05/2025 NDC: 9590-8033-97  Dx: H56.290 S/P  Witnessed by Sherrod LATHER

## 2024-06-13 DIAGNOSIS — G43909 Migraine, unspecified, not intractable, without status migrainosus: Secondary | ICD-10-CM | POA: Diagnosis not present

## 2024-06-13 DIAGNOSIS — E785 Hyperlipidemia, unspecified: Secondary | ICD-10-CM | POA: Diagnosis not present

## 2024-06-13 DIAGNOSIS — I10 Essential (primary) hypertension: Secondary | ICD-10-CM | POA: Diagnosis not present

## 2024-06-13 DIAGNOSIS — F909 Attention-deficit hyperactivity disorder, unspecified type: Secondary | ICD-10-CM | POA: Diagnosis not present

## 2024-06-24 ENCOUNTER — Telehealth: Payer: Self-pay

## 2024-06-24 ENCOUNTER — Other Ambulatory Visit (HOSPITAL_COMMUNITY): Payer: Self-pay

## 2024-06-24 NOTE — Telephone Encounter (Signed)
 Pharmacy Patient Advocate Encounter   Received notification from [employer] that prior authorization for Nurtec 75MG  dispersible tablets is required/requested.   Insurance verification completed.   The patient is insured through [facility]   Prior Authorization for Nurtec 75MG  dispersible tablets has been APPROVED from 06-24-2024 to 06-24-2025   PA #/Case ID/Reference #: CONARD

## 2024-07-18 DIAGNOSIS — H40013 Open angle with borderline findings, low risk, bilateral: Secondary | ICD-10-CM | POA: Diagnosis not present

## 2024-08-04 ENCOUNTER — Other Ambulatory Visit: Payer: Self-pay | Admitting: Adult Health

## 2024-08-19 ENCOUNTER — Encounter: Payer: Self-pay | Admitting: Adult Health

## 2024-08-23 ENCOUNTER — Other Ambulatory Visit (HOSPITAL_COMMUNITY): Payer: Self-pay

## 2024-08-23 ENCOUNTER — Telehealth: Payer: Self-pay

## 2024-08-23 NOTE — Telephone Encounter (Signed)
 Noted

## 2024-08-23 NOTE — Telephone Encounter (Signed)
 Pharmacy Patient Advocate Encounter   Received notification from Patient Advice Request messages that prior authorization for Nurtec is required/requested.   Insurance verification completed.   The patient is insured through [facility].   Per test claim: Refill too soon. PA is not needed at this time. Medication was filled 08/12/2024. Next eligible fill date is 09/04/2024.      I called [facility] and they have it ready for the PT, states no issues with PA and has been ready since [facility] [place] [facility].

## 2024-09-05 ENCOUNTER — Ambulatory Visit: Admitting: Adult Health

## 2024-09-09 ENCOUNTER — Ambulatory Visit: Admitting: Adult Health

## 2024-09-09 VITALS — BP 131/82 | HR 78

## 2024-09-09 DIAGNOSIS — G43719 Chronic migraine without aura, intractable, without status migrainosus: Secondary | ICD-10-CM

## 2024-09-09 DIAGNOSIS — G43709 Chronic migraine without aura, not intractable, without status migrainosus: Secondary | ICD-10-CM

## 2024-09-09 MED ORDER — ONABOTULINUMTOXINA 200 UNITS IJ SOLR
155.0000 [IU] | Freq: Once | INTRAMUSCULAR | Status: AC
  Administered 2024-09-09: 155 [IU] via INTRAMUSCULAR

## 2024-09-09 NOTE — Progress Notes (Signed)
 Botox - 200 units x 1 vial Lot: [id number] Expiration: 10/2026 NDC: 9976-6078-97   Bacteriostatic 0.9% Sodium Chloride -  4 mL  Lot: [id number] Expiration: 05/2025 NDC: 9590-8033-97   Dx: H56.290 S/P  Witnessed by Particia GRADE.

## 2024-09-09 NOTE — Progress Notes (Signed)
 "  09/09/24: She feels that Botox  continues to give her good benefit.  Often can use tylenol  first. She uses Nurtec or rizatriptan  for abortive therapy.  Weather is still a trigger for her.  On occasion she does get an aura with her migraines.  06/08/24: Reports that Botox  continues to work well.  She is having approximately 6 migraines, not always severe.  Continues to use Nurtec or rizatriptan  for abortive therapy. Reports that she had2 migraines with visual ura- brights lines Reports that she tolerated the last injections well.  03/10/24: 4-5 migraines since the last injection cycle. Botox  working well. Uses Nurtec or rizatriptan  and that works well.  No aura with these migraines.   12/11/23: Allergies have triggered some migraines. In the last 3 weeks more headaches d/t allergies. Prior to that was rarely having to use nurtec- maybe 4-5 times. Nurtec helps.   09/14/23: Migraines have improved. Less intense. Still have 2-3 migraines a week. She typically can work through these. Uses nurtec for migraines.   06/15/23: Botox  has helped with the severity of her headaches. Not sure how much nurtec has helped.  03/19/23: First botox  injections. Reviewed the procedure and potential side effects with the patient.   BOTOX  PROCEDURE NOTE FOR MIGRAINE HEADACHE    Contraindications and precautions discussed with patient(above). Aseptic procedure was observed and patient tolerated procedure. Procedure performed by Duwaine Russell, NP  The condition has existed for more than 6 months, and pt does not have a diagnosis of ALS, Myasthenia Gravis or Lambert-Eaton Syndrome.  Risks and benefits of injections discussed and pt agrees to proceed with the procedure.  Written consent obtained  These injections are medically necessary. These injections do not cause sedations or hallucinations which the oral therapies may cause.  Indication/Diagnosis: chronic migraine BOTOX (G9414) injection was performed according to  protocol by [employer]. 200 units of BOTOX  was dissolved into 4 cc NS.   NDC: [address]-98  Type of toxin: Botox        Botox - 200 units x 1 vial Lot: [id number] Expiration: 10/2026 NDC: 9976-6078-97   Bacteriostatic 0.9% Sodium Chloride -  4 mL  Lot: [id number] Expiration: 05/2025 NDC: 9590-8033-97   Dx: H56.290               Description of procedure:  The patient was placed in a sitting position. The standard protocol was used for Botox  as follows, with 5 units of Botox  injected at each site:   -Procerus muscle, midline injection  -Corrugator muscle, bilateral injection  -Frontalis muscle, bilateral injection, with 2 sites each side, medial injection was performed in the upper one third of the frontalis muscle, in the region vertical from the medial inferior edge of the superior orbital rim. The lateral injection was again in the upper one third of the forehead vertically above the lateral limbus of the cornea, 1.5 cm lateral to the medial injection site.  -Temporalis muscle injection, 4 sites, bilaterally. The first injection was 3 cm above the tragus of the ear, second injection site was 1.5 cm to 3 cm up from the first injection site in line with the tragus of the ear. The third injection site was 1.5-3 cm forward between the first 2 injection sites. The fourth injection site was 1.5 cm posterior to the second injection site.  -Occipitalis muscle injection, 3 sites, bilaterally. The first injection was done one half way between the occipital protuberance and the tip of the mastoid process behind the ear. The second injection site was done lateral and  superior to the first, 1 fingerbreadth from the first injection. The third injection site was 1 fingerbreadth superiorly and medially from the first injection site.  -Cervical paraspinal muscle injection, 2 sites, bilateral knee first injection site was 1 cm from the midline of the cervical spine, 3 cm inferior to the lower border of  the occipital protuberance. The second injection site was 1.5 cm superiorly and laterally to the first injection site.  -Trapezius muscle injection was performed at 3 sites, bilaterally. The first injection site was in the upper trapezius muscle halfway between the inflection point of the neck, and the acromion. The second injection site was one half way between the acromion and the first injection site. The third injection was done between the first injection site and the inflection point of the neck.   Will return for repeat injection in 3 months.   A 200 unit sof Botox  was used, 155 units were injected, the rest of the Botox  was wasted. The patient tolerated the procedure well, there were no complications of the above procedure.  Duwaine Russell, MSN, NP-C 09/09/2024, 9:29 AM [facility] [address] [place], [place] [address] [phone]  "

## 2024-12-02 ENCOUNTER — Ambulatory Visit: Admitting: Adult Health
